# Patient Record
Sex: Female | Born: 1940 | Race: White | Hispanic: No | Marital: Married | Smoking: Never smoker
Health system: Southern US, Community
[De-identification: ages and names within clinical notes are randomized; demographics above are authoritative.]

## PROBLEM LIST (undated history)

## (undated) DIAGNOSIS — N200 Calculus of kidney: Secondary | ICD-10-CM

## (undated) DIAGNOSIS — Z87442 Personal history of urinary calculi: Secondary | ICD-10-CM

## (undated) DIAGNOSIS — M1990 Unspecified osteoarthritis, unspecified site: Secondary | ICD-10-CM

## (undated) DIAGNOSIS — K219 Gastro-esophageal reflux disease without esophagitis: Secondary | ICD-10-CM

## (undated) DIAGNOSIS — I459 Conduction disorder, unspecified: Secondary | ICD-10-CM

## (undated) DIAGNOSIS — K529 Noninfective gastroenteritis and colitis, unspecified: Secondary | ICD-10-CM

## (undated) DIAGNOSIS — I00 Rheumatic fever without heart involvement: Secondary | ICD-10-CM

## (undated) DIAGNOSIS — G4730 Sleep apnea, unspecified: Secondary | ICD-10-CM

## (undated) DIAGNOSIS — M797 Fibromyalgia: Secondary | ICD-10-CM

## (undated) DIAGNOSIS — R51 Headache: Secondary | ICD-10-CM

## (undated) DIAGNOSIS — H3090 Unspecified chorioretinal inflammation, unspecified eye: Secondary | ICD-10-CM

## (undated) DIAGNOSIS — D649 Anemia, unspecified: Secondary | ICD-10-CM

## (undated) DIAGNOSIS — I10 Essential (primary) hypertension: Secondary | ICD-10-CM

## (undated) DIAGNOSIS — F419 Anxiety disorder, unspecified: Secondary | ICD-10-CM

## (undated) HISTORY — PX: COLONOSCOPY: SHX174

## (undated) HISTORY — PX: DILATION AND CURETTAGE OF UTERUS: SHX78

## (undated) HISTORY — PX: TONSILLECTOMY: SUR1361

## (undated) HISTORY — PX: SPINAL CORD STIMULATOR INSERTION: SHX5378

## (undated) HISTORY — PX: CARDIAC CATHETERIZATION: SHX172

---

## 1999-02-14 ENCOUNTER — Ambulatory Visit (HOSPITAL_COMMUNITY): Admission: RE | Admit: 1999-02-14 | Discharge: 1999-02-14 | Payer: Self-pay | Admitting: Obstetrics and Gynecology

## 2001-05-16 ENCOUNTER — Ambulatory Visit (HOSPITAL_COMMUNITY): Admission: RE | Admit: 2001-05-16 | Discharge: 2001-05-16 | Payer: Self-pay | Admitting: Cardiology

## 2004-02-04 ENCOUNTER — Ambulatory Visit (HOSPITAL_COMMUNITY): Admission: RE | Admit: 2004-02-04 | Discharge: 2004-02-04 | Payer: Self-pay | Admitting: Cardiology

## 2004-12-19 ENCOUNTER — Ambulatory Visit: Payer: Self-pay | Admitting: Family Medicine

## 2004-12-27 ENCOUNTER — Ambulatory Visit: Payer: Self-pay | Admitting: Family Medicine

## 2005-10-19 ENCOUNTER — Ambulatory Visit: Payer: Self-pay | Admitting: Family Medicine

## 2006-10-22 ENCOUNTER — Ambulatory Visit: Payer: Self-pay | Admitting: Family Medicine

## 2006-10-30 ENCOUNTER — Ambulatory Visit: Payer: Self-pay | Admitting: Family Medicine

## 2006-11-12 ENCOUNTER — Ambulatory Visit (HOSPITAL_COMMUNITY): Admission: RE | Admit: 2006-11-12 | Discharge: 2006-11-12 | Payer: Self-pay | Admitting: Family Medicine

## 2006-11-22 ENCOUNTER — Encounter: Admission: RE | Admit: 2006-11-22 | Discharge: 2006-11-22 | Payer: Self-pay | Admitting: Family Medicine

## 2006-11-22 ENCOUNTER — Ambulatory Visit: Payer: Self-pay | Admitting: Family Medicine

## 2008-10-12 ENCOUNTER — Encounter: Admission: RE | Admit: 2008-10-12 | Discharge: 2008-12-29 | Payer: Self-pay | Admitting: Specialist

## 2011-01-21 ENCOUNTER — Encounter: Payer: Self-pay | Admitting: Family Medicine

## 2011-05-18 NOTE — Cardiovascular Report (Signed)
NAME:  Emily Mcbride, Emily Mcbride                       ACCOUNT NO.:  [id number]   MEDICAL RECORD NO.:  [id number]                   PATIENT TYPE:  OIB   LOCATION:  2859                                 FACILITY:  [facility]   PHYSICIAN:  Colleen Can. Deborah Chalk, M.D.            DATE OF BIRTH:  1941-08-27   DATE OF PROCEDURE:  02/04/2004  DATE OF DISCHARGE:                              CARDIAC CATHETERIZATION   PROCEDURES PERFORMED:  1. Left heart catheterization.  2. Selective coronary angiography.  3. Left ventricular angiography.  4. Angio-Seal.   CARDIOLOGIST:  Colleen Can. Deborah Chalk, M.D.   HISTORY:  [name] was referred for evaluation of a deep heavy chest  discomfort.  She has had previous equivocal adenosine Cardiolite in large  part due to her obesity.  Since 2002 she has gained 25 pounds of weight.  She was referred for catheterization for evaluation of chest pain, possible  coronary atherosclerosis.  She had essentially a negative catheterization in  2002.   TYPE AND SITE OF ENTRY:  Percutaneous right femoral artery.   CATHETERS:  Six French 4 curved Judkins right and left coronary catheters.  Six French pigtail ventriculographic catheter.   CONTRAST MATERIAL:  Omnipaque.   MEDICATIONS:  Medication given prior to the procedure; Valium 10 mg p.o.   Medication given during the procedure; Versed 2 mg IV.   COMMENTS:  The patient tolerated the procedure well.   HEMODYNAMIC DATA:  Aortic pressure is 161/94.  LV was 152/9-17.  There is no  aortic valve gradient noted on pullback.   ANGIOGRAPHIC DATA:  1. Left Main Coronary Artery:  The left main coronary artery is normal.   1. Left Anterior Descending:  The left anterior descending was a moderate-     sized vessel that extended to the apex.  It was normal.   1. Left Circumflex:  The left circumflex had a short trunk off the left main     coronary artery, then divided into a high obtuse marginal that would     basically take an  intermediate distribution.  There was a smaller     continuation branch of the left circumflex in the AV groove.  The left     circumflex was normal.   1. Right Coronary Artery:  The right coronary artery is a moderate-sized     dominant vessel.  It is normal.   1. Left Ventricular Angiogram:  Left ventricular angiogram was performed in     the RAO position.  Overall cardiac size and silhouette are normal.  The     global ejection fraction was 60%.  Regional wall motion was normal.   OVERALL IMPRESSION:  1. Normal left ventricular function.  2. Normal coronary arteries.   DISCUSSION:  [name] is hypertension at this point in time and clearly her  obesity and hypertension are almost certainly the cause of her symptoms.  We  will try to manage her blood pressure  as well as encourage her to modify her  weight.                                               Colleen Can. Deborah Chalk, M.D.    SNT/MEDQ  D:  02/04/2004  T:  02/05/2004  Job:  [id number]   cc:   Delaney Meigs, M.D.  [address]  [place]  [place] [address]  Fax: [phone]   Dr. Yetta Barre

## 2011-05-18 NOTE — H&P (Signed)
NAME:  Emily Mcbride, Emily Mcbride                       ACCOUNT NO.:  [id number]   MEDICAL RECORD NO.:  [id number]                   PATIENT TYPE:  OIB   LOCATION:  2859                                 FACILITY:  [facility]   PHYSICIAN:  Colleen Can. Deborah Chalk, M.D.            DATE OF BIRTH:  07/24/1941   DATE OF ADMISSION:  02/04/2004  DATE OF DISCHARGE:                                HISTORY & PHYSICAL   CHIEF COMPLAINT:  Chest pain.   HISTORY OF PRESENT ILLNESS:  Ms. Pado is a 70 year old white female who  has multiple medical problems.  She presented to the office for an  appointment earlier this week with complaints of chest heaviness that was  radiating to her back.  She had been in the [employer] approximately four days  prior when she had an episode of deep, heavy substernal chest discomfort.  She was taken to see Dr. Fayrene Fearing, who was covering for Dr. Lysbeth Galas, and at that  point in time it was noted that her blood pressure was elevated, at 185/100.  She had really no associated symptoms, such as pallor, diaphoresis,  dizziness or light-headedness.  Her hands were somewhat sweaty.  She had  been off of her Toprol for one day, and off of aspirin for three or four  days, but had remained on her proton pump inhibitor during this time frame.  She was subsequently seen in the office, and is now referred for repeat  cardiac catheterization.  It is felt that we cannot satisfactorily evaluate  the patient's coronary anatomy with noninvasive studies, due to her problem  with obesity.   PAST MEDICAL HISTORY:  1. Previous equivocal adenosine Cardiolite in 2002 with subsequent cardiac     catheterization, May of 2002, showing no significant coronary disease.  2. Dilation of the right heart per 2-D echocardiogram in 2002.  3. Obesity.  4. Kidney stones in 1988.  5. History of D&C x 2.  6. Tonsillectomy.  7. History of toxoplasmosis.  8. Elevated cholesterol levels.  9. Positive family history for heart  disease.  10.      A history of rheumatic fever, but with negative follow-up 2-D     echocardiogram.  11.      Hypertension.  12.      PVCs.  13.      Cardiomegaly by chest x-ray.   ALLERGIES:  SULFA.   CURRENT MEDICATIONS:  Mobic 15 mg a day, Prilosec 20 mg a day, aspirin  daily, Toprol XL 50 mg a day, and Zoloft 50 mg a day.   FAMILY HISTORY:  Father died of questionable causes.  Mother died at 93 with  Cushing's disease, and had diabetes.   SOCIAL HISTORY:  She is married.  She has two children who are alive and  well.  There is no history of tobacco or alcohol.   REVIEW OF SYSTEMS:  Basically as noted above, and is otherwise unremarkable.  EXAM:  VITAL SIGNS:  Weight is 255, blood pressure 150/90 sitting, 130/90 standing,  heart rate 92 and regular, respirations 18.  She is afebrile.   HEENT:  Unremarkable.   NECK:  Supple.   LUNGS:  Clear.   HEART:  Regular rhythm.   ABDOMEN:  Obese.  It is soft.  Positive bowel sounds.  Nontender .   EXTREMITIES:  Without edema.   NEUROLOGIC:  Intact.  There are no gross focal deficits.   PERTINENT LABS:  Pending.   EKG:  Nonspecific ST and T wave changes.   OVERALL IMPRESSION:  1. Previous episode of chest pain, with history of an abnormal adenosine     Cardiolite in 2002.  2. Previous negative cardiac catheterization in 2002.  3. Obesity.  4. Hypertension, currently uncontrolled.  5. Hyperlipidemia off statin therapy.   PLAN:  We will proceed on with cardiac catheterization.  The procedure has  been reviewed in full detail, and she is willing to proceed on Friday,  February 04, 2004.      Juanell Fairly C. Earl Gala, N.P.                 Colleen Can. Deborah Chalk, M.D.    LCO/MEDQ  D:  02/04/2004  T:  02/04/2004  Job:  [id number]   cc:   Delaney Meigs, M.D.  [address]  [place]  [place] [address]  Fax: [phone]

## 2013-04-10 ENCOUNTER — Encounter (INDEPENDENT_AMBULATORY_CARE_PROVIDER_SITE_OTHER): Payer: Medicare PPO | Admitting: Ophthalmology

## 2013-04-10 DIAGNOSIS — H31009 Unspecified chorioretinal scars, unspecified eye: Secondary | ICD-10-CM

## 2013-04-10 DIAGNOSIS — H35039 Hypertensive retinopathy, unspecified eye: Secondary | ICD-10-CM

## 2013-04-10 DIAGNOSIS — I10 Essential (primary) hypertension: Secondary | ICD-10-CM

## 2013-04-10 DIAGNOSIS — H43819 Vitreous degeneration, unspecified eye: Secondary | ICD-10-CM

## 2013-04-10 DIAGNOSIS — H2510 Age-related nuclear cataract, unspecified eye: Secondary | ICD-10-CM

## 2013-06-01 ENCOUNTER — Other Ambulatory Visit: Payer: Self-pay | Admitting: Sports Medicine

## 2013-06-01 DIAGNOSIS — M545 Low back pain, unspecified: Secondary | ICD-10-CM

## 2013-06-06 ENCOUNTER — Ambulatory Visit
Admission: RE | Admit: 2013-06-06 | Discharge: 2013-06-06 | Disposition: A | Discharge status: Home / Self Care | Payer: Medicare PPO | Source: Ambulatory Visit | Attending: Sports Medicine | Admitting: Sports Medicine

## 2013-06-06 DIAGNOSIS — M545 Low back pain, unspecified: Secondary | ICD-10-CM

## 2013-11-12 ENCOUNTER — Other Ambulatory Visit: Payer: Self-pay | Admitting: Neurosurgery

## 2013-11-13 ENCOUNTER — Encounter (HOSPITAL_COMMUNITY): Payer: Self-pay | Admitting: *Deleted

## 2013-11-13 ENCOUNTER — Other Ambulatory Visit (HOSPITAL_COMMUNITY): Payer: Self-pay | Admitting: *Deleted

## 2013-11-13 ENCOUNTER — Encounter (HOSPITAL_COMMUNITY): Payer: Self-pay | Admitting: Pharmacy Technician

## 2013-11-16 ENCOUNTER — Encounter (HOSPITAL_COMMUNITY): Payer: Self-pay

## 2013-11-16 ENCOUNTER — Ambulatory Visit (HOSPITAL_COMMUNITY)
Admission: RE | Admit: 2013-11-16 | Discharge: 2013-11-17 | Disposition: A | Discharge status: Home / Self Care | Payer: Medicare PPO | Source: Ambulatory Visit | Attending: Neurosurgery | Admitting: Neurosurgery

## 2013-11-16 ENCOUNTER — Inpatient Hospital Stay (HOSPITAL_COMMUNITY): Payer: Medicare PPO

## 2013-11-16 ENCOUNTER — Encounter (HOSPITAL_COMMUNITY): Payer: Medicare PPO | Admitting: Anesthesiology

## 2013-11-16 ENCOUNTER — Inpatient Hospital Stay (HOSPITAL_COMMUNITY): Payer: Medicare PPO | Admitting: Anesthesiology

## 2013-11-16 ENCOUNTER — Encounter (HOSPITAL_COMMUNITY)
Admission: RE | Disposition: A | Discharge status: Home / Self Care | Payer: Self-pay | Source: Ambulatory Visit | Attending: Neurosurgery

## 2013-11-16 DIAGNOSIS — M1990 Unspecified osteoarthritis, unspecified site: Secondary | ICD-10-CM | POA: Insufficient documentation

## 2013-11-16 DIAGNOSIS — I10 Essential (primary) hypertension: Secondary | ICD-10-CM | POA: Insufficient documentation

## 2013-11-16 DIAGNOSIS — M48062 Spinal stenosis, lumbar region with neurogenic claudication: Principal | ICD-10-CM | POA: Diagnosis present

## 2013-11-16 DIAGNOSIS — N200 Calculus of kidney: Secondary | ICD-10-CM | POA: Insufficient documentation

## 2013-11-16 DIAGNOSIS — K219 Gastro-esophageal reflux disease without esophagitis: Secondary | ICD-10-CM | POA: Insufficient documentation

## 2013-11-16 DIAGNOSIS — IMO0001 Reserved for inherently not codable concepts without codable children: Secondary | ICD-10-CM | POA: Insufficient documentation

## 2013-11-16 DIAGNOSIS — G4730 Sleep apnea, unspecified: Secondary | ICD-10-CM | POA: Insufficient documentation

## 2013-11-16 DIAGNOSIS — R51 Headache: Secondary | ICD-10-CM | POA: Insufficient documentation

## 2013-11-16 HISTORY — DX: Rheumatic fever without heart involvement: I00

## 2013-11-16 HISTORY — DX: Noninfective gastroenteritis and colitis, unspecified: K52.9

## 2013-11-16 HISTORY — DX: Essential (primary) hypertension: I10

## 2013-11-16 HISTORY — DX: Headache: R51

## 2013-11-16 HISTORY — DX: Sleep apnea, unspecified: G47.30

## 2013-11-16 HISTORY — DX: Gastro-esophageal reflux disease without esophagitis: K21.9

## 2013-11-16 HISTORY — DX: Calculus of kidney: N20.0

## 2013-11-16 HISTORY — DX: Unspecified osteoarthritis, unspecified site: M19.90

## 2013-11-16 HISTORY — DX: Conduction disorder, unspecified: I45.9

## 2013-11-16 HISTORY — DX: Fibromyalgia: M79.7

## 2013-11-16 HISTORY — DX: Unspecified chorioretinal inflammation, unspecified eye: H30.90

## 2013-11-16 HISTORY — DX: Anemia, unspecified: D64.9

## 2013-11-16 HISTORY — PX: LUMBAR LAMINECTOMY/DECOMPRESSION MICRODISCECTOMY: SHX5026

## 2013-11-16 LAB — CBC WITH DIFFERENTIAL/PLATELET
Basophils Relative: 1 % (ref 0–1)
Eosinophils Absolute: 0.2 10*3/uL (ref 0.0–0.7)
Eosinophils Relative: 5 % (ref 0–5)
HCT: 36 % (ref 36.0–46.0)
Lymphs Abs: 1.6 10*3/uL (ref 0.7–4.0)
MCH: 31.6 pg (ref 26.0–34.0)
MCV: 93.3 fL (ref 78.0–100.0)
Monocytes Absolute: 0.5 10*3/uL (ref 0.1–1.0)
Platelets: 158 10*3/uL (ref 150–400)
RBC: 3.86 MIL/uL — ABNORMAL LOW (ref 3.87–5.11)

## 2013-11-16 LAB — BASIC METABOLIC PANEL
CO2: 27 mEq/L (ref 19–32)
Calcium: 9.7 mg/dL (ref 8.4–10.5)
Chloride: 106 mEq/L (ref 96–112)
Creatinine, Ser: 0.84 mg/dL (ref 0.50–1.10)
Glucose, Bld: 107 mg/dL — ABNORMAL HIGH (ref 70–99)
Potassium: 4.9 mEq/L (ref 3.5–5.1)
Sodium: 141 mEq/L (ref 135–145)

## 2013-11-16 LAB — SURGICAL PCR SCREEN: MRSA, PCR: NEGATIVE

## 2013-11-16 SURGERY — LUMBAR LAMINECTOMY/DECOMPRESSION MICRODISCECTOMY 1 LEVEL
Anesthesia: General | Site: Back | Laterality: Right | Wound class: Clean

## 2013-11-16 MED ORDER — THROMBIN 5000 UNITS EX SOLR
CUTANEOUS | Status: DC | PRN
  Administered 2013-11-16 (×2): 5000 [IU] via TOPICAL

## 2013-11-16 MED ORDER — KETOROLAC TROMETHAMINE 30 MG/ML IJ SOLN
INTRAMUSCULAR | Status: DC | PRN
  Administered 2013-11-16: 30 mg via INTRAVENOUS

## 2013-11-16 MED ORDER — ARTIFICIAL TEARS OP OINT
TOPICAL_OINTMENT | OPHTHALMIC | Status: DC | PRN
  Administered 2013-11-16: 1 via OPHTHALMIC

## 2013-11-16 MED ORDER — MUPIROCIN 2 % EX OINT
TOPICAL_OINTMENT | Freq: Two times a day (BID) | CUTANEOUS | Status: DC
  Administered 2013-11-16: 12:00:00 via NASAL
  Filled 2013-11-16: qty 22

## 2013-11-16 MED ORDER — SODIUM CHLORIDE 0.9 % IJ SOLN: 3.0000 mL | INTRAMUSCULAR | Status: DC | PRN

## 2013-11-16 MED ORDER — ACETAMINOPHEN 650 MG RE SUPP: 650.0000 mg | RECTAL | Status: DC | PRN

## 2013-11-16 MED ORDER — HEMOSTATIC AGENTS (NO CHARGE) OPTIME
TOPICAL | Status: DC | PRN
  Administered 2013-11-16: 1 via TOPICAL

## 2013-11-16 MED ORDER — CYCLOBENZAPRINE HCL 10 MG PO TABS
10.0000 mg | ORAL_TABLET | Freq: Three times a day (TID) | ORAL | Status: DC | PRN
  Administered 2013-11-16: 10 mg via ORAL

## 2013-11-16 MED ORDER — HYDROMORPHONE HCL PF 1 MG/ML IJ SOLN
INTRAMUSCULAR | Status: AC
  Filled 2013-11-16: qty 1

## 2013-11-16 MED ORDER — CEFAZOLIN SODIUM-DEXTROSE 2-3 GM-% IV SOLR
INTRAVENOUS | Status: DC | PRN
  Administered 2013-11-16: 2 g via INTRAVENOUS

## 2013-11-16 MED ORDER — HYDROCODONE-ACETAMINOPHEN 5-325 MG PO TABS: 1.0000 | ORAL_TABLET | ORAL | Status: DC | PRN

## 2013-11-16 MED ORDER — BUPIVACAINE HCL (PF) 0.25 % IJ SOLN
INTRAMUSCULAR | Status: DC | PRN
  Administered 2013-11-16: 20 mL

## 2013-11-16 MED ORDER — MUPIROCIN 2 % EX OINT
TOPICAL_OINTMENT | Freq: Two times a day (BID) | CUTANEOUS | Status: DC
  Administered 2013-11-16: 22:00:00 via NASAL

## 2013-11-16 MED ORDER — MESALAMINE ER 0.375 G PO CP24: 1.5000 g | ORAL_CAPSULE | Freq: Every day | ORAL | Status: DC

## 2013-11-16 MED ORDER — OXYCODONE-ACETAMINOPHEN 5-325 MG PO TABS: 1.0000 | ORAL_TABLET | ORAL | Status: DC | PRN

## 2013-11-16 MED ORDER — OXYCODONE HCL 5 MG/5ML PO SOLN: 5.0000 mg | Freq: Once | ORAL | Status: AC | PRN

## 2013-11-16 MED ORDER — SODIUM CHLORIDE 0.9 % IR SOLN
Status: DC | PRN
  Administered 2013-11-16: 16:00:00

## 2013-11-16 MED ORDER — CYCLOBENZAPRINE HCL 10 MG PO TABS
ORAL_TABLET | ORAL | Status: AC
  Filled 2013-11-16: qty 1

## 2013-11-16 MED ORDER — HYDROMORPHONE HCL PF 1 MG/ML IJ SOLN
0.2500 mg | INTRAMUSCULAR | Status: DC | PRN
  Administered 2013-11-16 (×4): 0.5 mg via INTRAVENOUS

## 2013-11-16 MED ORDER — MEPERIDINE HCL 25 MG/ML IJ SOLN: 6.2500 mg | INTRAMUSCULAR | Status: DC | PRN

## 2013-11-16 MED ORDER — MIDAZOLAM HCL 5 MG/5ML IJ SOLN
INTRAMUSCULAR | Status: DC | PRN
  Administered 2013-11-16: 2 mg via INTRAVENOUS

## 2013-11-16 MED ORDER — MIDAZOLAM HCL 2 MG/2ML IJ SOLN: 0.5000 mg | Freq: Once | INTRAMUSCULAR | Status: DC | PRN

## 2013-11-16 MED ORDER — KETOROLAC TROMETHAMINE 30 MG/ML IJ SOLN
30.0000 mg | Freq: Four times a day (QID) | INTRAMUSCULAR | Status: DC
  Administered 2013-11-16 – 2013-11-17 (×2): 30 mg via INTRAVENOUS
  Filled 2013-11-16 (×6): qty 1

## 2013-11-16 MED ORDER — EPHEDRINE SULFATE 50 MG/ML IJ SOLN
INTRAMUSCULAR | Status: DC | PRN
  Administered 2013-11-16 (×2): 20 mg via INTRAVENOUS
  Administered 2013-11-16 (×2): 10 mg via INTRAVENOUS
  Administered 2013-11-16: 20 mg via INTRAVENOUS
  Administered 2013-11-16: 10 mg via INTRAVENOUS
  Administered 2013-11-16: 20 mg via INTRAVENOUS

## 2013-11-16 MED ORDER — DEXAMETHASONE SODIUM PHOSPHATE 10 MG/ML IJ SOLN
10.0000 mg | INTRAMUSCULAR | Status: AC
  Administered 2013-11-16: 10 mg via INTRAVENOUS
  Filled 2013-11-16: qty 1

## 2013-11-16 MED ORDER — ONDANSETRON HCL 4 MG/2ML IJ SOLN
INTRAMUSCULAR | Status: DC | PRN
  Administered 2013-11-16: 4 mg via INTRAVENOUS

## 2013-11-16 MED ORDER — ACETAMINOPHEN 325 MG PO TABS: 650.0000 mg | ORAL_TABLET | ORAL | Status: DC | PRN

## 2013-11-16 MED ORDER — LACTATED RINGERS IV SOLN
INTRAVENOUS | Status: DC
  Administered 2013-11-16 (×2): via INTRAVENOUS

## 2013-11-16 MED ORDER — PROMETHAZINE HCL 25 MG/ML IJ SOLN: 6.2500 mg | INTRAMUSCULAR | Status: DC | PRN

## 2013-11-16 MED ORDER — FENTANYL CITRATE 0.05 MG/ML IJ SOLN
INTRAMUSCULAR | Status: DC | PRN
  Administered 2013-11-16: 250 ug via INTRAVENOUS

## 2013-11-16 MED ORDER — SENNA 8.6 MG PO TABS
1.0000 | ORAL_TABLET | Freq: Two times a day (BID) | ORAL | Status: DC
  Administered 2013-11-16: 8.6 mg via ORAL
  Filled 2013-11-16 (×3): qty 1

## 2013-11-16 MED ORDER — OXYCODONE HCL 5 MG PO TABS
5.0000 mg | ORAL_TABLET | Freq: Once | ORAL | Status: AC | PRN
  Administered 2013-11-16: 5 mg via ORAL

## 2013-11-16 MED ORDER — ASPIRIN EC 81 MG PO TBEC
81.0000 mg | DELAYED_RELEASE_TABLET | Freq: Every day | ORAL | Status: DC
  Administered 2013-11-16: 81 mg via ORAL
  Filled 2013-11-16 (×2): qty 1

## 2013-11-16 MED ORDER — NEOSTIGMINE METHYLSULFATE 1 MG/ML IJ SOLN
INTRAMUSCULAR | Status: DC | PRN
  Administered 2013-11-16: 4 mg via INTRAVENOUS

## 2013-11-16 MED ORDER — SODIUM CHLORIDE 0.9 % IJ SOLN
3.0000 mL | Freq: Two times a day (BID) | INTRAMUSCULAR | Status: DC
  Administered 2013-11-16: 3 mL via INTRAVENOUS

## 2013-11-16 MED ORDER — PREGABALIN 50 MG PO CAPS
150.0000 mg | ORAL_CAPSULE | Freq: Two times a day (BID) | ORAL | Status: DC
  Administered 2013-11-16: 150 mg via ORAL
  Filled 2013-11-16 (×2): qty 1

## 2013-11-16 MED ORDER — GLYCOPYRROLATE 0.2 MG/ML IJ SOLN
INTRAMUSCULAR | Status: DC | PRN
  Administered 2013-11-16: 0.6 mg via INTRAVENOUS

## 2013-11-16 MED ORDER — ALUM & MAG HYDROXIDE-SIMETH 200-200-20 MG/5ML PO SUSP: 30.0000 mL | Freq: Four times a day (QID) | ORAL | Status: DC | PRN

## 2013-11-16 MED ORDER — NEBIVOLOL HCL 10 MG PO TABS
10.0000 mg | ORAL_TABLET | Freq: Every day | ORAL | Status: DC
  Filled 2013-11-16: qty 1

## 2013-11-16 MED ORDER — MENTHOL 3 MG MT LOZG: 1.0000 | LOZENGE | OROMUCOSAL | Status: DC | PRN

## 2013-11-16 MED ORDER — PROPOFOL 10 MG/ML IV BOLUS
INTRAVENOUS | Status: DC | PRN
  Administered 2013-11-16: 120 mg via INTRAVENOUS

## 2013-11-16 MED ORDER — LIDOCAINE HCL (CARDIAC) 20 MG/ML IV SOLN
INTRAVENOUS | Status: DC | PRN
  Administered 2013-11-16: 20 mg via INTRAVENOUS

## 2013-11-16 MED ORDER — LABETALOL HCL 5 MG/ML IV SOLN
INTRAVENOUS | Status: DC | PRN
  Administered 2013-11-16 (×2): 5 mg via INTRAVENOUS

## 2013-11-16 MED ORDER — CEFAZOLIN SODIUM-DEXTROSE 2-3 GM-% IV SOLR
2.0000 g | INTRAVENOUS | Status: AC
  Administered 2013-11-16: 2 g via INTRAVENOUS
  Filled 2013-11-16: qty 50

## 2013-11-16 MED ORDER — MESALAMINE ER 0.375 G PO CP24
1.5000 g | ORAL_CAPSULE | Freq: Every day | ORAL | Status: DC
  Administered 2013-11-16: 1.5 g via ORAL

## 2013-11-16 MED ORDER — HYDROMORPHONE HCL PF 1 MG/ML IJ SOLN
0.5000 mg | INTRAMUSCULAR | Status: DC | PRN
  Administered 2013-11-17: 1 mg via INTRAVENOUS
  Filled 2013-11-16: qty 1

## 2013-11-16 MED ORDER — ROCURONIUM BROMIDE 100 MG/10ML IV SOLN
INTRAVENOUS | Status: DC | PRN
  Administered 2013-11-16: 50 mg via INTRAVENOUS

## 2013-11-16 MED ORDER — CEFAZOLIN SODIUM 1-5 GM-% IV SOLN
1.0000 g | Freq: Three times a day (TID) | INTRAVENOUS | Status: AC
  Administered 2013-11-16 – 2013-11-17 (×2): 1 g via INTRAVENOUS
  Filled 2013-11-16 (×2): qty 50

## 2013-11-16 MED ORDER — SODIUM CHLORIDE 0.9 % IV SOLN: 250.0000 mL | INTRAVENOUS | Status: DC

## 2013-11-16 MED ORDER — ONDANSETRON HCL 4 MG/2ML IJ SOLN: 4.0000 mg | INTRAMUSCULAR | Status: DC | PRN

## 2013-11-16 MED ORDER — OXYCODONE HCL 5 MG PO TABS
ORAL_TABLET | ORAL | Status: AC
  Filled 2013-11-16: qty 1

## 2013-11-16 MED ORDER — SERTRALINE HCL 50 MG PO TABS
50.0000 mg | ORAL_TABLET | Freq: Every day | ORAL | Status: DC
  Filled 2013-11-16: qty 1

## 2013-11-16 MED ORDER — PHENOL 1.4 % MT LIQD: 1.0000 | OROMUCOSAL | Status: DC | PRN

## 2013-11-16 SURGICAL SUPPLY — 52 items
BAG DECANTER FOR FLEXI CONT (MISCELLANEOUS) ×2 IMPLANT
BENZOIN TINCTURE PRP APPL 2/3 (GAUZE/BANDAGES/DRESSINGS) ×2 IMPLANT
BLADE SURG ROTATE 9660 (MISCELLANEOUS) IMPLANT
BRUSH SCRUB EZ PLAIN DRY (MISCELLANEOUS) ×2 IMPLANT
BUR CUTTER 7.0 ROUND (BURR) ×2 IMPLANT
CANISTER SUCT 3000ML (MISCELLANEOUS) ×2 IMPLANT
CONT SPEC 4OZ CLIKSEAL STRL BL (MISCELLANEOUS) ×2 IMPLANT
DECANTER SPIKE VIAL GLASS SM (MISCELLANEOUS) ×2 IMPLANT
DERMABOND ADVANCED (GAUZE/BANDAGES/DRESSINGS) ×1
DERMABOND ADVANCED .7 DNX12 (GAUZE/BANDAGES/DRESSINGS) ×1 IMPLANT
DRAPE LAPAROTOMY 100X72X124 (DRAPES) ×2 IMPLANT
DRAPE MICROSCOPE LEICA (MISCELLANEOUS) ×2 IMPLANT
DRAPE MICROSCOPE ZEISS OPMI (DRAPES) IMPLANT
DRAPE POUCH INSTRU U-SHP 10X18 (DRAPES) ×2 IMPLANT
DRAPE PROXIMA HALF (DRAPES) IMPLANT
DRAPE SURG 17X23 STRL (DRAPES) ×4 IMPLANT
DURAPREP 26ML APPLICATOR (WOUND CARE) ×2 IMPLANT
ELECT REM PT RETURN 9FT ADLT (ELECTROSURGICAL) ×2
ELECTRODE REM PT RTRN 9FT ADLT (ELECTROSURGICAL) ×1 IMPLANT
EVACUATOR 1/8 PVC DRAIN (DRAIN) ×2 IMPLANT
GAUZE SPONGE 4X4 16PLY XRAY LF (GAUZE/BANDAGES/DRESSINGS) IMPLANT
GLOVE BIOGEL PI IND STRL 7.5 (GLOVE) ×1 IMPLANT
GLOVE BIOGEL PI IND STRL 8.5 (GLOVE) ×1 IMPLANT
GLOVE BIOGEL PI INDICATOR 7.5 (GLOVE) ×1
GLOVE BIOGEL PI INDICATOR 8.5 (GLOVE) ×1
GLOVE ECLIPSE 8.5 STRL (GLOVE) ×4 IMPLANT
GLOVE EXAM NITRILE LRG STRL (GLOVE) IMPLANT
GLOVE EXAM NITRILE MD LF STRL (GLOVE) ×2 IMPLANT
GLOVE EXAM NITRILE XL STR (GLOVE) IMPLANT
GLOVE EXAM NITRILE XS STR PU (GLOVE) IMPLANT
GLOVE SURG SS PI 7.0 STRL IVOR (GLOVE) ×4 IMPLANT
GOWN BRE IMP SLV AUR LG STRL (GOWN DISPOSABLE) IMPLANT
GOWN BRE IMP SLV AUR XL STRL (GOWN DISPOSABLE) ×4 IMPLANT
GOWN STRL REIN 2XL LVL4 (GOWN DISPOSABLE) ×2 IMPLANT
KIT BASIN OR (CUSTOM PROCEDURE TRAY) ×2 IMPLANT
KIT ROOM TURNOVER OR (KITS) ×2 IMPLANT
NEEDLE HYPO 22GX1.5 SAFETY (NEEDLE) ×2 IMPLANT
NEEDLE SPNL 22GX3.5 QUINCKE BK (NEEDLE) ×2 IMPLANT
NS IRRIG 1000ML POUR BTL (IV SOLUTION) ×2 IMPLANT
PACK LAMINECTOMY NEURO (CUSTOM PROCEDURE TRAY) ×2 IMPLANT
PAD ARMBOARD 7.5X6 YLW CONV (MISCELLANEOUS) ×6 IMPLANT
RUBBERBAND STERILE (MISCELLANEOUS) ×4 IMPLANT
SPONGE GAUZE 4X4 12PLY (GAUZE/BANDAGES/DRESSINGS) ×2 IMPLANT
SPONGE SURGIFOAM ABS GEL SZ50 (HEMOSTASIS) ×2 IMPLANT
STRIP CLOSURE SKIN 1/2X4 (GAUZE/BANDAGES/DRESSINGS) ×2 IMPLANT
SUT VIC AB 2-0 CT1 18 (SUTURE) ×2 IMPLANT
SUT VIC AB 3-0 SH 8-18 (SUTURE) ×2 IMPLANT
SYR 20ML ECCENTRIC (SYRINGE) ×2 IMPLANT
TAPE CLOTH SURG 4X10 WHT LF (GAUZE/BANDAGES/DRESSINGS) ×2 IMPLANT
TOWEL OR 17X24 6PK STRL BLUE (TOWEL DISPOSABLE) ×2 IMPLANT
TOWEL OR 17X26 10 PK STRL BLUE (TOWEL DISPOSABLE) ×2 IMPLANT
WATER STERILE IRR 1000ML POUR (IV SOLUTION) ×2 IMPLANT

## 2013-11-16 NOTE — Preoperative (Signed)
Beta Blockers   Reason not to administer Beta Blockers:BB taken on 11/16/13 @ 0800.

## 2013-11-16 NOTE — Transfer of Care (Signed)
Immediate Anesthesia Transfer of Care Note  Patient: Emily Mcbride  Procedure(s) Performed: Procedure(s) with comments: LUMBAR LAMINECTOMY/Laminotomy/DECOMPRESSION MICRODISCECTOMY RIGHT LUMBAR FOUR-FIVE (Right) - LUMBAR LAMINECTOMY/Laminotomy/DECOMPRESSION MICRODISCECTOMY RIGHT LUMBAR FOUR-FIVE  Patient Location: PACU  Anesthesia Type:General  Level of Consciousness: awake, alert  and oriented  Airway & Oxygen Therapy: Patient Spontanous Breathing and Patient connected to nasal cannula oxygen  Post-op Assessment: Report given to PACU RN, Post -op Vital signs reviewed and stable and Patient moving all extremities  Post vital signs: Reviewed and stable  Complications: No apparent anesthesia complications

## 2013-11-16 NOTE — Anesthesia Postprocedure Evaluation (Signed)
Anesthesia Post Note  Patient: Emily Mcbride  Procedure(s) Performed: Procedure(s) (LRB): LUMBAR LAMINECTOMY/Laminotomy/DECOMPRESSION MICRODISCECTOMY RIGHT LUMBAR FOUR-FIVE (Right)  Anesthesia type: General  Patient location: PACU  Post pain: Pain level controlled and Adequate analgesia  Post assessment: Post-op Vital signs reviewed, Patient's Cardiovascular Status Stable, Respiratory Function Stable, Patent Airway and Pain level controlled  Last Vitals:  Filed Vitals:   11/16/13 1745  BP:   Pulse: 67  Temp: 36.6 C  Resp: 21    Post vital signs: Reviewed and stable  Level of consciousness: awake, alert  and oriented  Complications: No apparent anesthesia complications

## 2013-11-16 NOTE — Anesthesia Procedure Notes (Signed)
Procedure Name: Intubation Date/Time: 11/16/2013 2:54 PM Performed by: Orvilla Fus A Pre-anesthesia Checklist: Patient identified, Timeout performed, Emergency Drugs available, Suction available and Patient being monitored Patient Re-evaluated:Patient Re-evaluated prior to inductionOxygen Delivery Method: Circle system utilized Preoxygenation: Pre-oxygenation with 100% oxygen Intubation Type: IV induction Ventilation: Mask ventilation without difficulty and Oral airway inserted - appropriate to patient size Laryngoscope Size: Mac and 3 Grade View: Grade II Tube type: Oral Tube size: 7.0 mm Number of attempts: 1 Airway Equipment and Method: Stylet Placement Confirmation: ETT inserted through vocal cords under direct vision,  breath sounds checked- equal and bilateral and positive ETCO2 Secured at: 21 cm Tube secured with: Tape Dental Injury: Teeth and Oropharynx as per pre-operative assessment

## 2013-11-16 NOTE — H&P (Signed)
Emily Mcbride is an 72 y.o. female.   Chief Complaint: Right leg pain HPI: A 72 year old female with severe right lower extremity pain paresthesias and some weakness consistent predominantly with a right-sided L5 radiculopathy. Symptoms are worsened by standing or walking. Workup demonstrates evidence of marked spondylosis and stenosis at L4-5. Patient presents now for right L4-5 decompressive laminotomy and foraminotomy in improving her symptoms.  Past Medical History  Diagnosis Date  . Hypertension   . Skipped heart beats     due to rheumatic fever as a child  . Fibromyalgia   . Sleep apnea     does not use CPAP due discomfort  . Kidney stones   . GERD (gastroesophageal reflux disease)     no longer takes meds   . Headache(784.0)     due to a fall several mos.  . Arthritis     osteoarthritis  . Rheumatic fever     as a child  . Anemia     as a child  . Colitis   . Chorioretinitis     Past Surgical History  Procedure Laterality Date  . Tonsillectomy    . Cardiac catheterization    . Colonoscopy      History reviewed. No pertinent family history. Social History:  reports that she has never smoked. She has never used smokeless tobacco. She reports that she does not drink alcohol or use illicit drugs.  Allergies:  Allergies  Allergen Reactions  . Sulfa Antibiotics Hives, Rash and Other (See Comments)    Blisters     Medications Prior to Admission  Medication Sig Dispense Refill  . meloxicam (MOBIC) 15 MG tablet Take 15 mg by mouth daily.      . mesalamine (APRISO) 0.375 G 24 hr capsule Take 1.5 g by mouth daily.       . nebivolol (BYSTOLIC) 10 MG tablet Take 10 mg by mouth daily.      . pregabalin (LYRICA) 75 MG capsule Take 150 mg by mouth 2 (two) times daily.      . sertraline (ZOLOFT) 50 MG tablet Take 50 mg by mouth daily.      Marland Kitchen triamcinolone cream (KENALOG) 0.1 % Apply 1 application topically 2 (two) times daily as needed (rash).      Marland Kitchen aspirin EC 81 MG  tablet Take 81 mg by mouth daily.        Results for orders placed during the hospital encounter of 11/16/13 (from the past 48 hour(s))  BASIC METABOLIC PANEL     Status: Abnormal   Collection Time    11/16/13 11:03 AM      Result Value Range   Sodium 141  135 - 145 mEq/L   Potassium 4.9  3.5 - 5.1 mEq/L   Chloride 106  96 - 112 mEq/L   CO2 27  19 - 32 mEq/L   Glucose, Bld 107 (*) 70 - 99 mg/dL   BUN 22  6 - 23 mg/dL   Creatinine, Ser 2.95  0.50 - 1.10 mg/dL   Calcium 9.7  8.4 - 62.1 mg/dL   GFR calc non Af Amer 68 (*) >90 mL/min   GFR calc Af Amer 79 (*) >90 mL/min   Comment: (NOTE)     The eGFR has been calculated using the CKD EPI equation.     This calculation has not been validated in all clinical situations.     eGFR's persistently <90 mL/min signify possible Chronic Kidney     Disease.  CBC WITH DIFFERENTIAL     Status: Abnormal   Collection Time    11/16/13 11:03 AM      Result Value Range   WBC 5.0  4.0 - 10.5 K/uL   RBC 3.86 (*) 3.87 - 5.11 MIL/uL   Hemoglobin 12.2  12.0 - 15.0 g/dL   HCT 40.9  81.1 - 91.4 %   MCV 93.3  78.0 - 100.0 fL   MCH 31.6  26.0 - 34.0 pg   MCHC 33.9  30.0 - 36.0 g/dL   RDW 78.2  95.6 - 21.3 %   Platelets 158  150 - 400 K/uL   Neutrophils Relative % 51  43 - 77 %   Neutro Abs 2.5  1.7 - 7.7 K/uL   Lymphocytes Relative 33  12 - 46 %   Lymphs Abs 1.6  0.7 - 4.0 K/uL   Monocytes Relative 11  3 - 12 %   Monocytes Absolute 0.5  0.1 - 1.0 K/uL   Eosinophils Relative 5  0 - 5 %   Eosinophils Absolute 0.2  0.0 - 0.7 K/uL   Basophils Relative 1  0 - 1 %   Basophils Absolute 0.0  0.0 - 0.1 K/uL  SURGICAL PCR SCREEN     Status: Abnormal   Collection Time    11/16/13 12:28 PM      Result Value Range   MRSA, PCR NEGATIVE  NEGATIVE   Staphylococcus aureus POSITIVE (*) NEGATIVE   Comment:            The Xpert SA Assay (FDA     approved for NASAL specimens     in patients over 53 years of age),     is one component of     a comprehensive  surveillance     program.  Test performance has     been validated by [employer] for patients greater     than or equal to 87 year old.     It is not intended     to diagnose infection nor to     guide or monitor treatment.   No results found.  Review of Systems  Constitutional: Negative.   HENT: Negative.   Eyes: Negative.   Respiratory: Negative.   Cardiovascular: Negative.   Gastrointestinal: Negative.   Genitourinary: Negative.   Musculoskeletal: Negative.   Skin: Negative.   Neurological: Negative.   Endo/Heme/Allergies: Negative.   Psychiatric/Behavioral: Negative.     Blood pressure 158/70, pulse 69, temperature 97.3 F (36.3 C), temperature source Oral, resp. rate 20, height 5' 2.5" (1.588 m), weight 116 kg (255 lb 11.7 oz), SpO2 97.00%. Physical Exam  Constitutional: She is oriented to person, place, and time. She appears well-developed and well-nourished. No distress.  HENT:  Head: Normocephalic and atraumatic.  Right Ear: External ear normal.  Left Ear: External ear normal.  Nose: Nose normal.  Mouth/Throat: Oropharynx is clear and moist. No oropharyngeal exudate.  Eyes: Conjunctivae and EOM are normal. Pupils are equal, round, and reactive to light. Right eye exhibits no discharge. Left eye exhibits no discharge.  Neck: Normal range of motion. Neck supple. No tracheal deviation present. No thyromegaly present.  Cardiovascular: Normal rate, regular rhythm, normal heart sounds and intact distal pulses.   No murmur heard. Respiratory: Effort normal and breath sounds normal. No respiratory distress. She has no wheezes.  GI: Soft. Bowel sounds are normal. She exhibits no distension. There is no tenderness.  Musculoskeletal: Normal range of  motion. She exhibits no edema and no tenderness.  Neurological: She is alert and oriented to person, place, and time. She has normal reflexes. No cranial nerve deficit. Coordination normal.  Skin: Skin is warm and dry. No  rash noted. She is not diaphoretic. No erythema. No pallor.  Psychiatric: She has a normal mood and affect. Her behavior is normal. Judgment and thought content normal.     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 Plan right L4-5 decompressive laminotomy and foraminotomy. Risks and benefits have been explained. Patient wishes to proceed.  Alem Fahl A 11/16/2013, 2:32 PM

## 2013-11-16 NOTE — Brief Op Note (Signed)
11/16/2013  4:31 PM  PATIENT:  Emily Mcbride  72 y.o. female  PRE-OPERATIVE DIAGNOSIS:  stenosis  POST-OPERATIVE DIAGNOSIS:  stenosis  PROCEDURE:  Procedure(s) with comments: LUMBAR LAMINECTOMY/Laminotomy/DECOMPRESSION MICRODISCECTOMY RIGHT LUMBAR FOUR-FIVE (Right) - LUMBAR LAMINECTOMY/Laminotomy/DECOMPRESSION MICRODISCECTOMY RIGHT LUMBAR FOUR-FIVE  SURGEON:  Surgeon(s) and Role:    * Temple Pacini, MD - Primary    * Barnett Abu, MD - Assisting  PHYSICIAN ASSISTANT:   ASSISTANTS:    ANESTHESIA:   general  EBL:  Total I/O In: 1000 [I.V.:1000] Out: 50 [Blood:50]  BLOOD ADMINISTERED:none  DRAINS: (Medium) Hemovact drain(s) in the Epidural space with  Suction Open   LOCAL MEDICATIONS USED:  MARCAINE     SPECIMEN:  No Specimen  DISPOSITION OF SPECIMEN:  N/A  COUNTS:  YES  TOURNIQUET:  * No tourniquets in log *  DICTATION: .Dragon Dictation  PLAN OF CARE: Admit for overnight observation  PATIENT DISPOSITION:  PACU - hemodynamically stable.   Delay start of Pharmacological VTE agent (>24hrs) due to surgical blood loss or risk of bleeding: yes

## 2013-11-16 NOTE — Anesthesia Preprocedure Evaluation (Addendum)
Anesthesia Evaluation  Patient identified by MRN, date of birth, ID band Patient awake    Reviewed: Allergy & Precautions, H&P , Patient's Chart, lab work & pertinent test results, reviewed documented beta blocker date and time   History of Anesthesia Complications Negative for: history of anesthetic complications  Airway Mallampati: II      Dental  (+) Poor Dentition, Missing, Chipped and Dental Advisory Given   Pulmonary sleep apnea (does not use CPAP) ,  breath sounds clear to auscultation  Pulmonary exam normal       Cardiovascular hypertension, Pt. on medications and Pt. on home beta blockers Rhythm:Regular Rate:Normal  '05 cath: normal coronaries, normal LV   Neuro/Psych    GI/Hepatic Neg liver ROS, GERD-  Medicated and Controlled,  Endo/Other  Morbid obesity  Renal/GU negative Renal ROS     Musculoskeletal  (+) Fibromyalgia -  Abdominal (+) + obese,   Peds  Hematology negative hematology ROS (+)   Anesthesia Other Findings   Reproductive/Obstetrics                        Anesthesia Physical Anesthesia Plan  ASA: III  Anesthesia Plan: General   Post-op Pain Management:    Induction: Intravenous  Airway Management Planned: Oral ETT  Additional Equipment:   Intra-op Plan:   Post-operative Plan: Extubation in OR  Informed Consent: I have reviewed the patients History and Physical, chart, labs and discussed the procedure including the risks, benefits and alternatives for the proposed anesthesia with the patient or authorized representative who has indicated his/her understanding and acceptance.   Dental advisory given  Plan Discussed with: CRNA and Surgeon  Anesthesia Plan Comments: (Plan routine monitors, GETA)        Anesthesia Quick Evaluation

## 2013-11-16 NOTE — Op Note (Signed)
Date of procedure: 11/16/2013  Date of dictation: Same  Service: Neurosurgery  Preoperative diagnosis: L4-5 stenosis with neurogenic claudication  Postoperative diagnosis: Same  Procedure Name: Right L4-5 decompressive laminotomy with right L4 and L5 decompressive foraminotomies  Surgeon:Trenice Mesa A.Tanyla Stege, M.D.  Asst. Surgeon: Danielle Dess  Anesthesia: General  Indication: The patient is a 72 year old female with severe right lower extremity pain paresthesias and weakness consistent with a mix lumbar radiculopathy thought to be secondary to stenosis at L4-5. Patient's failed conservative management and presents now for right L4-5 decompressive laminotomy and foraminotomies in hopes of improving her symptoms.  Operative note: After induction anesthesia, patient positioned prone onto Wilson frame and appropriately padded. Lumbar region prepped and draped. Incision made overlying L4-5. Subperiosteal dissection performed on the left side. Laminotomy performed using high-speed drill and Kerrison rongeurs. Ligamentum flavum elevated and resected piecemeal fashion. Underlying thecal sac was identified. Decompressive foraminotomies were then performed on course exiting L4 and L5 nerve roots on the right side. Midline structures were undercut using Kerrison rongeurs. At least a substantial partial decompression the left side was also undertaken from this approach. At this point a very thorough decompression been achieved. There is no his injury to thecal sac and nerve roots. Wound is then irrigated with saline solution. Gelfoam was placed topically for hemostasis. Medium Hemovac drain was left at per space. Wounds and close in layers with Vicryl sutures. Steri-Strips and sterile dressing were applied. There were no apparent complications. Patient tolerated the procedure well and she returns to the recovery postop.

## 2013-11-17 MED ORDER — HYDROCODONE-ACETAMINOPHEN 5-325 MG PO TABS: 1.0000 | ORAL_TABLET | ORAL | Status: DC | PRN

## 2013-11-17 MED ORDER — CYCLOBENZAPRINE HCL 10 MG PO TABS: 10.0000 mg | ORAL_TABLET | Freq: Three times a day (TID) | ORAL | Status: DC | PRN

## 2013-11-17 NOTE — Discharge Summary (Signed)
Physician Discharge Summary  Patient ID: NILE DORNING MRN: [id number] DOB/AGE: 1941-01-21 72 y.o.  Admit date: 11/16/2013 Discharge date: 11/17/2013  Admission Diagnoses:  Discharge Diagnoses:  Principal Problem:   Spinal stenosis, lumbar region, with neurogenic claudication   Discharged Condition: good  Hospital Course: Patient admitted to the hospital where she underwent uncomplicated  Consults:   Significant Diagnostic Studies:   Treatments:   Discharge Exam: Blood pressure 96/54, pulse 70, temperature 99.3 F (37.4 C), temperature source Oral, resp. rate 18, height 5' 2.5" (1.588 m), weight 116 kg (255 lb 11.7 oz), SpO2 93.00%. Awake and alert. Oriented and appropriate. Cranial nerve function intact. Motor and sensory function extremities normal. Wound clean and dry. Chest and abdomen benign.  Disposition:      Medication List         aspirin EC 81 MG tablet  Take 81 mg by mouth daily.     cyclobenzaprine 10 MG tablet  Commonly known as:  FLEXERIL  Take 1 tablet (10 mg total) by mouth 3 (three) times daily as needed for muscle spasms.     HYDROcodone-acetaminophen 5-325 MG per tablet  Commonly known as:  NORCO/VICODIN  Take 1-2 tablets by mouth every 4 (four) hours as needed for moderate pain.     meloxicam 15 MG tablet  Commonly known as:  MOBIC  Take 15 mg by mouth daily.     mesalamine 0.375 G 24 hr capsule  Commonly known as:  APRISO  Take 1.5 g by mouth daily.     nebivolol 10 MG tablet  Commonly known as:  BYSTOLIC  Take 10 mg by mouth daily.     pregabalin 75 MG capsule  Commonly known as:  LYRICA  Take 150 mg by mouth 2 (two) times daily.     sertraline 50 MG tablet  Commonly known as:  ZOLOFT  Take 50 mg by mouth daily.     triamcinolone cream 0.1 %  Commonly known as:  KENALOG  Apply 1 application topically 2 (two) times daily as needed (rash).           Follow-up Information   Follow up with Temple Pacini, MD.   Specialty:   Neurosurgery   Contact information:   [address] [place] [address] [phone]       Signed: Roshan Salamon A 11/17/2013, 9:13 AM

## 2013-11-17 NOTE — Progress Notes (Signed)
Pt and husband given D/C instructions with Rx's, verbal understanding was given. Pt stable @ D/C and she had no other needs @ this time. Pt D/C'd home via wheelchair @ [phone] per MD order. Rema Fendt, RN

## 2013-11-20 ENCOUNTER — Encounter (HOSPITAL_COMMUNITY): Payer: Self-pay | Admitting: Neurosurgery

## 2014-06-24 ENCOUNTER — Ambulatory Visit: Payer: Medicare PPO | Attending: Neurosurgery | Admitting: Physical Therapy

## 2014-06-24 DIAGNOSIS — M545 Low back pain, unspecified: Secondary | ICD-10-CM | POA: Diagnosis not present

## 2014-06-24 DIAGNOSIS — M48061 Spinal stenosis, lumbar region without neurogenic claudication: Secondary | ICD-10-CM | POA: Diagnosis not present

## 2014-06-24 DIAGNOSIS — I10 Essential (primary) hypertension: Secondary | ICD-10-CM | POA: Insufficient documentation

## 2014-06-24 DIAGNOSIS — R5381 Other malaise: Secondary | ICD-10-CM | POA: Diagnosis not present

## 2014-06-24 DIAGNOSIS — IMO0001 Reserved for inherently not codable concepts without codable children: Secondary | ICD-10-CM | POA: Insufficient documentation

## 2014-06-29 ENCOUNTER — Ambulatory Visit: Payer: Medicare PPO | Admitting: Physical Therapy

## 2014-06-29 DIAGNOSIS — IMO0001 Reserved for inherently not codable concepts without codable children: Secondary | ICD-10-CM | POA: Diagnosis not present

## 2014-07-05 ENCOUNTER — Ambulatory Visit: Payer: Medicare PPO | Attending: Neurosurgery | Admitting: Physical Therapy

## 2014-07-05 DIAGNOSIS — I10 Essential (primary) hypertension: Secondary | ICD-10-CM | POA: Insufficient documentation

## 2014-07-05 DIAGNOSIS — IMO0001 Reserved for inherently not codable concepts without codable children: Secondary | ICD-10-CM | POA: Insufficient documentation

## 2014-07-05 DIAGNOSIS — R5381 Other malaise: Secondary | ICD-10-CM | POA: Insufficient documentation

## 2014-07-05 DIAGNOSIS — M545 Low back pain, unspecified: Secondary | ICD-10-CM | POA: Insufficient documentation

## 2014-07-05 DIAGNOSIS — M48061 Spinal stenosis, lumbar region without neurogenic claudication: Secondary | ICD-10-CM | POA: Insufficient documentation

## 2014-07-08 ENCOUNTER — Ambulatory Visit: Payer: Medicare PPO | Admitting: Physical Therapy

## 2014-07-08 DIAGNOSIS — IMO0001 Reserved for inherently not codable concepts without codable children: Secondary | ICD-10-CM | POA: Diagnosis not present

## 2014-07-12 ENCOUNTER — Ambulatory Visit: Payer: Medicare PPO | Admitting: Physical Therapy

## 2014-07-12 DIAGNOSIS — IMO0001 Reserved for inherently not codable concepts without codable children: Secondary | ICD-10-CM | POA: Diagnosis not present

## 2014-07-15 ENCOUNTER — Ambulatory Visit: Payer: Medicare PPO | Admitting: *Deleted

## 2014-07-15 DIAGNOSIS — IMO0001 Reserved for inherently not codable concepts without codable children: Secondary | ICD-10-CM | POA: Diagnosis not present

## 2014-07-19 ENCOUNTER — Ambulatory Visit: Payer: Medicare PPO | Admitting: Physical Therapy

## 2014-07-19 DIAGNOSIS — IMO0001 Reserved for inherently not codable concepts without codable children: Secondary | ICD-10-CM | POA: Diagnosis not present

## 2014-07-21 ENCOUNTER — Ambulatory Visit: Payer: Medicare PPO | Admitting: Physical Therapy

## 2014-07-21 DIAGNOSIS — IMO0001 Reserved for inherently not codable concepts without codable children: Secondary | ICD-10-CM | POA: Diagnosis not present

## 2016-04-09 ENCOUNTER — Other Ambulatory Visit: Payer: Self-pay | Admitting: Neurosurgery

## 2016-04-09 DIAGNOSIS — M48062 Spinal stenosis, lumbar region with neurogenic claudication: Secondary | ICD-10-CM

## 2016-04-10 ENCOUNTER — Other Ambulatory Visit: Payer: Self-pay | Admitting: Adult Health Nurse Practitioner

## 2016-04-10 ENCOUNTER — Ambulatory Visit
Admission: RE | Admit: 2016-04-10 | Discharge: 2016-04-10 | Disposition: A | Discharge status: Home / Self Care | Payer: Medicare Other | Source: Ambulatory Visit | Attending: Neurosurgery | Admitting: Neurosurgery

## 2016-04-10 DIAGNOSIS — M48062 Spinal stenosis, lumbar region with neurogenic claudication: Secondary | ICD-10-CM

## 2016-04-10 DIAGNOSIS — R921 Mammographic calcification found on diagnostic imaging of breast: Secondary | ICD-10-CM

## 2016-04-10 DIAGNOSIS — Z780 Asymptomatic menopausal state: Secondary | ICD-10-CM

## 2016-04-27 ENCOUNTER — Ambulatory Visit
Admission: RE | Admit: 2016-04-27 | Discharge: 2016-04-27 | Disposition: A | Discharge status: Home / Self Care | Payer: Medicare Other | Source: Ambulatory Visit | Attending: Adult Health Nurse Practitioner | Admitting: Adult Health Nurse Practitioner

## 2016-04-27 DIAGNOSIS — Z780 Asymptomatic menopausal state: Secondary | ICD-10-CM

## 2016-04-27 DIAGNOSIS — R921 Mammographic calcification found on diagnostic imaging of breast: Secondary | ICD-10-CM

## 2018-04-15 ENCOUNTER — Other Ambulatory Visit: Payer: Self-pay | Admitting: Adult Health Nurse Practitioner

## 2018-04-15 DIAGNOSIS — M8580 Other specified disorders of bone density and structure, unspecified site: Secondary | ICD-10-CM

## 2018-05-15 ENCOUNTER — Ambulatory Visit
Admission: RE | Admit: 2018-05-15 | Discharge: 2018-05-15 | Disposition: A | Discharge status: Home / Self Care | Payer: Medicare Other | Source: Ambulatory Visit | Attending: Adult Health Nurse Practitioner | Admitting: Adult Health Nurse Practitioner

## 2018-05-15 DIAGNOSIS — M8580 Other specified disorders of bone density and structure, unspecified site: Secondary | ICD-10-CM

## 2018-07-28 ENCOUNTER — Ambulatory Visit
Admission: RE | Admit: 2018-07-28 | Discharge: 2018-07-28 | Disposition: A | Discharge status: Home / Self Care | Payer: Medicare Other | Source: Ambulatory Visit | Attending: Neurosurgery | Admitting: Neurosurgery

## 2018-07-28 ENCOUNTER — Other Ambulatory Visit: Payer: Self-pay | Admitting: Neurosurgery

## 2018-07-28 DIAGNOSIS — M48062 Spinal stenosis, lumbar region with neurogenic claudication: Secondary | ICD-10-CM

## 2018-07-28 MED ORDER — GADOBENATE DIMEGLUMINE 529 MG/ML IV SOLN
20.0000 mL | Freq: Once | INTRAVENOUS | Status: AC | PRN
  Administered 2018-07-28: 20 mL via INTRAVENOUS

## 2018-09-26 ENCOUNTER — Other Ambulatory Visit: Payer: Self-pay | Admitting: Neurosurgery

## 2018-09-30 NOTE — Pre-Procedure Instructions (Signed)
Emily Mcbride  09/30/2018      [facility] [address], [place] - [address] [place] [address] Phone: [phone] Fax: [phone]    Your procedure is scheduled on October 09, 2018.  Report to [facility] Admitting at 615 AM.  Call this number if you have problems the morning of surgery:  [phone]   Remember:  Do not eat or drink after midnight.    Take these medicines the morning of surgery with A SIP OF WATER  Nebivolol-bystolic  Tylenol-if needed pregabalin (lyrica) Sertraline (zoloft)  Follow your surgeon's instructions on when to hold/resume aspirin.  If no instructions were given call the office to determine how they would like to you take aspirin  7 days prior to surgery STOP taking any meloxicam (mobic), Aleve, Naproxen, Ibuprofen, Motrin, Advil, Goody's, BC's, all herbal medications, fish oil, and all vitamins    Do not wear jewelry, make-up or nail polish.  Do not wear lotions, powders, or perfumes, or deodorant.  Do not shave 48 hours prior to surgery.    Do not bring valuables to the hospital.  [facility] is not responsible for any belongings or valuables.  Contacts, dentures or bridgework may not be worn into surgery.  Leave your suitcase in the car.  After surgery it may be brought to your room.  For patients admitted to the hospital, discharge time will be determined by your treatment team.  Patients discharged the day of surgery will not be allowed to drive home.    [place]- Preparing For Surgery  Before surgery, you can play an important role. Because skin is not sterile, your skin needs to be as free of germs as possible. You can reduce the number of germs on your skin by washing with CHG (chlorahexidine gluconate) Soap before surgery.  CHG is an antiseptic cleaner which kills germs and bonds with the skin to continue killing germs even after washing.    Oral Hygiene is also important to  reduce your risk of infection.  Remember - BRUSH YOUR TEETH THE MORNING OF SURGERY WITH YOUR REGULAR TOOTHPASTE  Please do not use if you have an allergy to CHG or antibacterial soaps. If your skin becomes reddened/irritated stop using the CHG.  Do not shave (including legs and underarms) for at least 48 hours prior to first CHG shower. It is OK to shave your face.  Please follow these instructions carefully.   1. Shower the NIGHT BEFORE SURGERY and the MORNING OF SURGERY with CHG.   2. If you chose to wash your hair, wash your hair first as usual with your normal shampoo.  3. After you shampoo, rinse your hair and body thoroughly to remove the shampoo.  4. Use CHG as you would any other liquid soap. You can apply CHG directly to the skin and wash gently with a scrungie or a clean washcloth.   5. Apply the CHG Soap to your body ONLY FROM THE NECK DOWN.  Do not use on open wounds or open sores. Avoid contact with your eyes, ears, mouth and genitals (private parts). Wash Face and genitals (private parts)  with your normal soap.  6. Wash thoroughly, paying special attention to the area where your surgery will be performed.  7. Thoroughly rinse your body with warm water from the neck down.  8. DO NOT shower/wash with your normal soap after using and rinsing off the CHG Soap.  9.  Pat yourself dry with a CLEAN TOWEL.  10. Wear CLEAN PAJAMAS to bed the night before surgery, wear comfortable clothes the morning of surgery  11. Place CLEAN SHEETS on your bed the night of your first shower and DO NOT SLEEP WITH PETS.  Day of Surgery:  Do not apply any deodorants/lotions.  Please wear clean clothes to the hospital/surgery center.   Remember to brush your teeth WITH YOUR REGULAR TOOTHPASTE.  Please read over the following fact sheets that you were given. Pain Booklet, Coughing and Deep Breathing, MRSA Information and Surgical Site Infection Prevention

## 2018-10-01 ENCOUNTER — Encounter (HOSPITAL_COMMUNITY): Payer: Self-pay

## 2018-10-01 ENCOUNTER — Other Ambulatory Visit: Payer: Self-pay

## 2018-10-01 ENCOUNTER — Encounter (HOSPITAL_COMMUNITY)
Admission: RE | Admit: 2018-10-01 | Discharge: 2018-10-01 | Disposition: A | Discharge status: Home / Self Care | Payer: Medicare Other | Source: Ambulatory Visit | Attending: Neurosurgery | Admitting: Neurosurgery

## 2018-10-01 DIAGNOSIS — Z01818 Encounter for other preprocedural examination: Secondary | ICD-10-CM | POA: Insufficient documentation

## 2018-10-01 LAB — BASIC METABOLIC PANEL
ANION GAP: 6 (ref 5–15)
BUN: 27 mg/dL — ABNORMAL HIGH (ref 8–23)
CHLORIDE: 107 mmol/L (ref 98–111)
CO2: 27 mmol/L (ref 22–32)
Calcium: 9.5 mg/dL (ref 8.9–10.3)
Creatinine, Ser: 0.86 mg/dL (ref 0.44–1.00)
GFR calc Af Amer: >60 mL/min (ref >60)
GFR calc non Af Amer: >60 mL/min (ref >60)
Glucose, Bld: 101 mg/dL — ABNORMAL HIGH (ref 70–99)
POTASSIUM: 4 mmol/L (ref 3.5–5.1)
SODIUM: 140 mmol/L (ref 135–145)

## 2018-10-01 LAB — SURGICAL PCR SCREEN
MRSA, PCR: NEGATIVE
Staphylococcus aureus: NEGATIVE

## 2018-10-01 LAB — CBC
HCT: 38.7 % (ref 36.0–46.0)
HEMOGLOBIN: 12.2 g/dL (ref 12.0–15.0)
MCH: 31.4 pg (ref 26.0–34.0)
MCHC: 31.5 g/dL (ref 30.0–36.0)
MCV: 99.7 fL (ref 78.0–100.0)
PLATELETS: 168 10*3/uL (ref 150–400)
RBC: 3.88 MIL/uL (ref 3.87–5.11)
RDW: 12.5 % (ref 11.5–15.5)
WBC: 5.4 10*3/uL (ref 4.0–10.5)

## 2018-10-01 NOTE — Progress Notes (Addendum)
PCP: Sharon Seller NP Cardiologist: Denies  EKG: Today CXR: Denies ECHO: Denies Stress Test: >20 years ago Cardiac Cath: >20 years ago--doesn't remember location or doctor's name.  Reports "everything with my heart was fine, just told to lose weight." Sleep Study: >20 years ago, diagnosed with OSA, does not use CPAP  Reports MD told her to stop ASA on 10/02/17   Patient denies shortness of breath, fever, cough, and chest pain at PAT appointment.  Patient verbalized understanding of instructions provided today at the PAT appointment.  Patient asked to review instructions at home and day of surgery.

## 2018-10-09 ENCOUNTER — Ambulatory Visit (HOSPITAL_COMMUNITY): Payer: Medicare Other | Admitting: Physician Assistant

## 2018-10-09 ENCOUNTER — Ambulatory Visit (HOSPITAL_COMMUNITY): Payer: Medicare Other

## 2018-10-09 ENCOUNTER — Ambulatory Visit (HOSPITAL_COMMUNITY): Payer: Medicare Other | Admitting: Certified Registered Nurse Anesthetist

## 2018-10-09 ENCOUNTER — Encounter (HOSPITAL_COMMUNITY)
Admission: RE | Disposition: A | Discharge status: Home / Self Care | Payer: Self-pay | Source: Ambulatory Visit | Attending: Neurosurgery

## 2018-10-09 ENCOUNTER — Observation Stay (HOSPITAL_COMMUNITY)
Admission: RE | Admit: 2018-10-09 | Discharge: 2018-10-10 | Disposition: A | Discharge status: Home / Self Care | Payer: Medicare Other | Source: Ambulatory Visit | Attending: Neurosurgery | Admitting: Neurosurgery

## 2018-10-09 ENCOUNTER — Encounter (HOSPITAL_COMMUNITY): Payer: Self-pay

## 2018-10-09 DIAGNOSIS — Z7982 Long term (current) use of aspirin: Secondary | ICD-10-CM | POA: Diagnosis not present

## 2018-10-09 DIAGNOSIS — Z882 Allergy status to sulfonamides status: Secondary | ICD-10-CM | POA: Diagnosis not present

## 2018-10-09 DIAGNOSIS — M1990 Unspecified osteoarthritis, unspecified site: Secondary | ICD-10-CM | POA: Insufficient documentation

## 2018-10-09 DIAGNOSIS — Z419 Encounter for procedure for purposes other than remedying health state, unspecified: Secondary | ICD-10-CM

## 2018-10-09 DIAGNOSIS — Z79899 Other long term (current) drug therapy: Secondary | ICD-10-CM | POA: Diagnosis not present

## 2018-10-09 DIAGNOSIS — I10 Essential (primary) hypertension: Secondary | ICD-10-CM | POA: Diagnosis not present

## 2018-10-09 DIAGNOSIS — M4807 Spinal stenosis, lumbosacral region: Secondary | ICD-10-CM | POA: Diagnosis not present

## 2018-10-09 DIAGNOSIS — M48061 Spinal stenosis, lumbar region without neurogenic claudication: Secondary | ICD-10-CM | POA: Diagnosis present

## 2018-10-09 DIAGNOSIS — M797 Fibromyalgia: Secondary | ICD-10-CM | POA: Diagnosis not present

## 2018-10-09 DIAGNOSIS — M5416 Radiculopathy, lumbar region: Secondary | ICD-10-CM | POA: Diagnosis not present

## 2018-10-09 HISTORY — PX: LUMBAR LAMINECTOMY/DECOMPRESSION MICRODISCECTOMY: SHX5026

## 2018-10-09 SURGERY — LUMBAR LAMINECTOMY/DECOMPRESSION MICRODISCECTOMY 1 LEVEL
Anesthesia: General | Site: Back | Laterality: Right

## 2018-10-09 MED ORDER — CEFAZOLIN SODIUM-DEXTROSE 1-4 GM/50ML-% IV SOLN
1.0000 g | Freq: Three times a day (TID) | INTRAVENOUS | Status: AC
  Administered 2018-10-09 (×2): 1 g via INTRAVENOUS
  Filled 2018-10-09 (×2): qty 50

## 2018-10-09 MED ORDER — SERTRALINE HCL 50 MG PO TABS
50.0000 mg | ORAL_TABLET | Freq: Every day | ORAL | Status: DC
  Administered 2018-10-09 – 2018-10-10 (×2): 50 mg via ORAL
  Filled 2018-10-09 (×2): qty 1

## 2018-10-09 MED ORDER — ROCURONIUM BROMIDE 10 MG/ML (PF) SYRINGE
PREFILLED_SYRINGE | INTRAVENOUS | Status: DC | PRN
  Administered 2018-10-09: 50 mg via INTRAVENOUS

## 2018-10-09 MED ORDER — DEXAMETHASONE SODIUM PHOSPHATE 10 MG/ML IJ SOLN
INTRAMUSCULAR | Status: AC
  Filled 2018-10-09: qty 1

## 2018-10-09 MED ORDER — FENTANYL CITRATE (PF) 250 MCG/5ML IJ SOLN
INTRAMUSCULAR | Status: AC
  Filled 2018-10-09: qty 5

## 2018-10-09 MED ORDER — HEMOSTATIC AGENTS (NO CHARGE) OPTIME
TOPICAL | Status: DC | PRN
  Administered 2018-10-09: 1 via TOPICAL

## 2018-10-09 MED ORDER — THROMBIN 5000 UNITS EX SOLR
CUTANEOUS | Status: AC
  Filled 2018-10-09: qty 15000

## 2018-10-09 MED ORDER — OXYCODONE HCL 5 MG/5ML PO SOLN: 5.0000 mg | Freq: Once | ORAL | Status: DC | PRN

## 2018-10-09 MED ORDER — SODIUM CHLORIDE 0.9 % IV SOLN: 250.0000 mL | INTRAVENOUS | Status: DC

## 2018-10-09 MED ORDER — HYDROMORPHONE HCL 1 MG/ML IJ SOLN: 1.0000 mg | INTRAMUSCULAR | Status: DC | PRN

## 2018-10-09 MED ORDER — CALCIUM CARBONATE 1500 (600 CA) MG PO TABS
600.0000 mg | ORAL_TABLET | Freq: Every day | ORAL | Status: DC
  Filled 2018-10-09: qty 1

## 2018-10-09 MED ORDER — DEXAMETHASONE SODIUM PHOSPHATE 10 MG/ML IJ SOLN
INTRAMUSCULAR | Status: DC | PRN
  Administered 2018-10-09: 8 mg via INTRAVENOUS

## 2018-10-09 MED ORDER — HYDROMORPHONE HCL 1 MG/ML IJ SOLN
INTRAMUSCULAR | Status: AC
  Filled 2018-10-09: qty 1

## 2018-10-09 MED ORDER — SODIUM CHLORIDE 0.9% FLUSH: 3.0000 mL | INTRAVENOUS | Status: DC | PRN

## 2018-10-09 MED ORDER — PREGABALIN 75 MG PO CAPS
75.0000 mg | ORAL_CAPSULE | Freq: Every day | ORAL | Status: DC
  Administered 2018-10-09 – 2018-10-10 (×2): 75 mg via ORAL
  Filled 2018-10-09 (×2): qty 1

## 2018-10-09 MED ORDER — ACETAMINOPHEN 10 MG/ML IV SOLN
INTRAVENOUS | Status: DC | PRN
  Administered 2018-10-09: 1000 mg via INTRAVENOUS

## 2018-10-09 MED ORDER — PROPOFOL 10 MG/ML IV BOLUS
INTRAVENOUS | Status: AC
  Filled 2018-10-09: qty 20

## 2018-10-09 MED ORDER — ADULT MULTIVITAMIN W/MINERALS CH
1.0000 | ORAL_TABLET | Freq: Every day | ORAL | Status: DC
  Administered 2018-10-09: 1 via ORAL
  Filled 2018-10-09: qty 1

## 2018-10-09 MED ORDER — OXYCODONE HCL 5 MG PO TABS: 5.0000 mg | ORAL_TABLET | Freq: Once | ORAL | Status: DC | PRN

## 2018-10-09 MED ORDER — EPHEDRINE SULFATE-NACL 50-0.9 MG/10ML-% IV SOSY
PREFILLED_SYRINGE | INTRAVENOUS | Status: DC | PRN
  Administered 2018-10-09 (×2): 5 mg via INTRAVENOUS
  Administered 2018-10-09: 10 mg via INTRAVENOUS
  Administered 2018-10-09: 5 mg via INTRAVENOUS

## 2018-10-09 MED ORDER — HYDROCODONE-ACETAMINOPHEN 10-325 MG PO TABS: 2.0000 | ORAL_TABLET | ORAL | Status: DC | PRN

## 2018-10-09 MED ORDER — CALCIUM CARBONATE 1250 (500 CA) MG PO TABS
1.0000 | ORAL_TABLET | Freq: Every day | ORAL | Status: DC
  Administered 2018-10-10: 500 mg via ORAL
  Filled 2018-10-09: qty 1

## 2018-10-09 MED ORDER — SUGAMMADEX SODIUM 200 MG/2ML IV SOLN
INTRAVENOUS | Status: DC | PRN
  Administered 2018-10-09: 200 mg via INTRAVENOUS

## 2018-10-09 MED ORDER — DEXMEDETOMIDINE HCL 200 MCG/2ML IV SOLN
INTRAVENOUS | Status: DC | PRN
  Administered 2018-10-09: 8 ug via INTRAVENOUS

## 2018-10-09 MED ORDER — BUPIVACAINE HCL (PF) 0.25 % IJ SOLN
INTRAMUSCULAR | Status: DC | PRN
  Administered 2018-10-09: 20 mL

## 2018-10-09 MED ORDER — ONDANSETRON HCL 4 MG/2ML IJ SOLN
INTRAMUSCULAR | Status: AC
  Filled 2018-10-09: qty 2

## 2018-10-09 MED ORDER — KETOROLAC TROMETHAMINE 30 MG/ML IJ SOLN
INTRAMUSCULAR | Status: DC | PRN
  Administered 2018-10-09: 30 mg via INTRAVENOUS

## 2018-10-09 MED ORDER — GLYCOPYRROLATE PF 0.2 MG/ML IJ SOSY
PREFILLED_SYRINGE | INTRAMUSCULAR | Status: AC
  Filled 2018-10-09: qty 1

## 2018-10-09 MED ORDER — ONDANSETRON HCL 4 MG PO TABS: 4.0000 mg | ORAL_TABLET | Freq: Four times a day (QID) | ORAL | Status: DC | PRN

## 2018-10-09 MED ORDER — LIDOCAINE 2% (20 MG/ML) 5 ML SYRINGE
INTRAMUSCULAR | Status: AC
  Filled 2018-10-09: qty 5

## 2018-10-09 MED ORDER — LACTATED RINGERS IV SOLN
INTRAVENOUS | Status: DC | PRN
  Administered 2018-10-09: 08:00:00 via INTRAVENOUS

## 2018-10-09 MED ORDER — CHLORHEXIDINE GLUCONATE CLOTH 2 % EX PADS: 6.0000 | MEDICATED_PAD | Freq: Once | CUTANEOUS | Status: DC

## 2018-10-09 MED ORDER — CEFAZOLIN SODIUM-DEXTROSE 2-4 GM/100ML-% IV SOLN
2.0000 g | INTRAVENOUS | Status: AC
  Administered 2018-10-09: 2 g via INTRAVENOUS
  Filled 2018-10-09: qty 100

## 2018-10-09 MED ORDER — MENTHOL (TOPICAL ANALGESIC) 2.5 % EX LIQD: Freq: Every day | CUTANEOUS | Status: DC | PRN

## 2018-10-09 MED ORDER — PHENOL 1.4 % MT LIQD: 1.0000 | OROMUCOSAL | Status: DC | PRN

## 2018-10-09 MED ORDER — ACETAMINOPHEN 325 MG PO TABS: 650.0000 mg | ORAL_TABLET | ORAL | Status: DC | PRN

## 2018-10-09 MED ORDER — PROMETHAZINE HCL 25 MG/ML IJ SOLN: 6.2500 mg | INTRAMUSCULAR | Status: DC | PRN

## 2018-10-09 MED ORDER — KETOROLAC TROMETHAMINE 15 MG/ML IJ SOLN
30.0000 mg | Freq: Four times a day (QID) | INTRAMUSCULAR | Status: AC
  Administered 2018-10-09 – 2018-10-10 (×4): 30 mg via INTRAVENOUS
  Filled 2018-10-09 (×4): qty 2

## 2018-10-09 MED ORDER — ESMOLOL HCL 100 MG/10ML IV SOLN
INTRAVENOUS | Status: AC
  Filled 2018-10-09: qty 10

## 2018-10-09 MED ORDER — THROMBIN 5000 UNITS EX SOLR
CUTANEOUS | Status: AC
  Filled 2018-10-09: qty 5000

## 2018-10-09 MED ORDER — LACTATED RINGERS IV SOLN
INTRAVENOUS | Status: DC
  Administered 2018-10-09: 08:00:00 via INTRAVENOUS

## 2018-10-09 MED ORDER — SODIUM CHLORIDE 0.9 % IV SOLN
INTRAVENOUS | Status: DC | PRN
  Administered 2018-10-09: 500 mL

## 2018-10-09 MED ORDER — FENTANYL CITRATE (PF) 100 MCG/2ML IJ SOLN
INTRAMUSCULAR | Status: DC | PRN
  Administered 2018-10-09 (×3): 25 ug via INTRAVENOUS
  Administered 2018-10-09: 50 ug via INTRAVENOUS
  Administered 2018-10-09: 75 ug via INTRAVENOUS

## 2018-10-09 MED ORDER — CYCLOBENZAPRINE HCL 10 MG PO TABS
10.0000 mg | ORAL_TABLET | Freq: Three times a day (TID) | ORAL | Status: DC | PRN
  Administered 2018-10-09: 10 mg via ORAL
  Filled 2018-10-09: qty 1

## 2018-10-09 MED ORDER — LIDOCAINE 2% (20 MG/ML) 5 ML SYRINGE
INTRAMUSCULAR | Status: DC | PRN
  Administered 2018-10-09: 80 mg via INTRAVENOUS

## 2018-10-09 MED ORDER — LACTATED RINGERS IV SOLN: INTRAVENOUS | Status: DC

## 2018-10-09 MED ORDER — THROMBIN 5000 UNITS EX SOLR
CUTANEOUS | Status: DC | PRN
  Administered 2018-10-09 (×2): 5000 [IU] via TOPICAL

## 2018-10-09 MED ORDER — ROCURONIUM BROMIDE 50 MG/5ML IV SOSY
PREFILLED_SYRINGE | INTRAVENOUS | Status: AC
  Filled 2018-10-09: qty 5

## 2018-10-09 MED ORDER — NEBIVOLOL HCL 10 MG PO TABS
10.0000 mg | ORAL_TABLET | Freq: Every day | ORAL | Status: DC
Start: 2018-10-09 — End: 2018-10-10
  Administered 2018-10-09 – 2018-10-10 (×2): 10 mg via ORAL
  Filled 2018-10-09 (×2): qty 1

## 2018-10-09 MED ORDER — PHENYLEPHRINE 40 MCG/ML (10ML) SYRINGE FOR IV PUSH (FOR BLOOD PRESSURE SUPPORT)
PREFILLED_SYRINGE | INTRAVENOUS | Status: DC | PRN
  Administered 2018-10-09: 80 ug via INTRAVENOUS
  Administered 2018-10-09: 40 ug via INTRAVENOUS

## 2018-10-09 MED ORDER — ACETAMINOPHEN 650 MG RE SUPP: 650.0000 mg | RECTAL | Status: DC | PRN

## 2018-10-09 MED ORDER — ONDANSETRON HCL 4 MG/2ML IJ SOLN
INTRAMUSCULAR | Status: DC | PRN
  Administered 2018-10-09: 4 mg via INTRAVENOUS

## 2018-10-09 MED ORDER — HYDROCODONE-ACETAMINOPHEN 5-325 MG PO TABS
1.0000 | ORAL_TABLET | ORAL | Status: DC | PRN
  Administered 2018-10-09 – 2018-10-10 (×2): 1 via ORAL
  Filled 2018-10-09 (×2): qty 1

## 2018-10-09 MED ORDER — MENTHOL 3 MG MT LOZG
1.0000 | LOZENGE | OROMUCOSAL | Status: DC | PRN
  Filled 2018-10-09: qty 9

## 2018-10-09 MED ORDER — BUPIVACAINE HCL (PF) 0.25 % IJ SOLN
INTRAMUSCULAR | Status: AC
  Filled 2018-10-09: qty 30

## 2018-10-09 MED ORDER — ACETAMINOPHEN 10 MG/ML IV SOLN
INTRAVENOUS | Status: AC
  Filled 2018-10-09: qty 100

## 2018-10-09 MED ORDER — SODIUM CHLORIDE 0.9% FLUSH
3.0000 mL | Freq: Two times a day (BID) | INTRAVENOUS | Status: DC
  Administered 2018-10-09: 3 mL via INTRAVENOUS

## 2018-10-09 MED ORDER — ONDANSETRON HCL 4 MG/2ML IJ SOLN: 4.0000 mg | Freq: Four times a day (QID) | INTRAMUSCULAR | Status: DC | PRN

## 2018-10-09 MED ORDER — 0.9 % SODIUM CHLORIDE (POUR BTL) OPTIME
TOPICAL | Status: DC | PRN
  Administered 2018-10-09: 1000 mL

## 2018-10-09 MED ORDER — HYDROMORPHONE HCL 1 MG/ML IJ SOLN
0.2500 mg | INTRAMUSCULAR | Status: DC | PRN
  Administered 2018-10-09 (×4): 0.25 mg via INTRAVENOUS

## 2018-10-09 MED ORDER — PROPOFOL 10 MG/ML IV BOLUS
INTRAVENOUS | Status: DC | PRN
  Administered 2018-10-09: 120 mg via INTRAVENOUS

## 2018-10-09 SURGICAL SUPPLY — 55 items
BAG DECANTER FOR FLEXI CONT (MISCELLANEOUS) ×2 IMPLANT
BENZOIN TINCTURE PRP APPL 2/3 (GAUZE/BANDAGES/DRESSINGS) ×2 IMPLANT
BLADE CLIPPER SURG (BLADE) IMPLANT
BUR CUTTER 7.0 ROUND (BURR) ×2 IMPLANT
CANISTER SUCT 3000ML PPV (MISCELLANEOUS) ×2 IMPLANT
CARTRIDGE OIL MAESTRO DRILL (MISCELLANEOUS) ×1 IMPLANT
CLSR STERI-STRIP ANTIMIC 1/2X4 (GAUZE/BANDAGES/DRESSINGS) ×2 IMPLANT
COVER WAND RF STERILE (DRAPES) ×2 IMPLANT
DECANTER SPIKE VIAL GLASS SM (MISCELLANEOUS) ×2 IMPLANT
DERMABOND ADVANCED (GAUZE/BANDAGES/DRESSINGS) ×1
DERMABOND ADVANCED .7 DNX12 (GAUZE/BANDAGES/DRESSINGS) ×1 IMPLANT
DIFFUSER DRILL AIR PNEUMATIC (MISCELLANEOUS) ×2 IMPLANT
DRAPE HALF SHEET 40X57 (DRAPES) ×2 IMPLANT
DRAPE LAPAROTOMY 100X72X124 (DRAPES) ×2 IMPLANT
DRAPE MICROSCOPE LEICA (MISCELLANEOUS) ×2 IMPLANT
DRAPE SURG 17X23 STRL (DRAPES) ×4 IMPLANT
DRSG OPSITE POSTOP 4X6 (GAUZE/BANDAGES/DRESSINGS) ×2 IMPLANT
DURAPREP 26ML APPLICATOR (WOUND CARE) ×2 IMPLANT
ELECT REM PT RETURN 9FT ADLT (ELECTROSURGICAL) ×2
ELECTRODE REM PT RTRN 9FT ADLT (ELECTROSURGICAL) ×1 IMPLANT
GAUZE 4X4 16PLY RFD (DISPOSABLE) IMPLANT
GAUZE SPONGE 4X4 12PLY STRL (GAUZE/BANDAGES/DRESSINGS) IMPLANT
GLOVE BIO SURGEON STRL SZ 6.5 (GLOVE) ×2 IMPLANT
GLOVE BIOGEL PI IND STRL 6.5 (GLOVE) ×1 IMPLANT
GLOVE BIOGEL PI IND STRL 7.0 (GLOVE) ×1 IMPLANT
GLOVE BIOGEL PI IND STRL 7.5 (GLOVE) ×1 IMPLANT
GLOVE BIOGEL PI INDICATOR 6.5 (GLOVE) ×1
GLOVE BIOGEL PI INDICATOR 7.0 (GLOVE) ×1
GLOVE BIOGEL PI INDICATOR 7.5 (GLOVE) ×1
GLOVE ECLIPSE 9.0 STRL (GLOVE) ×2 IMPLANT
GLOVE EXAM NITRILE LRG STRL (GLOVE) IMPLANT
GLOVE EXAM NITRILE XL STR (GLOVE) IMPLANT
GLOVE EXAM NITRILE XS STR PU (GLOVE) IMPLANT
GLOVE SURG SS PI 7.5 STRL IVOR (GLOVE) ×8 IMPLANT
GOWN STRL REUS W/ TWL LRG LVL3 (GOWN DISPOSABLE) ×2 IMPLANT
GOWN STRL REUS W/ TWL XL LVL3 (GOWN DISPOSABLE) ×1 IMPLANT
GOWN STRL REUS W/TWL 2XL LVL3 (GOWN DISPOSABLE) IMPLANT
GOWN STRL REUS W/TWL LRG LVL3 (GOWN DISPOSABLE) ×2
GOWN STRL REUS W/TWL XL LVL3 (GOWN DISPOSABLE) ×1
KIT BASIN OR (CUSTOM PROCEDURE TRAY) ×2 IMPLANT
KIT TURNOVER KIT B (KITS) ×2 IMPLANT
NEEDLE HYPO 22GX1.5 SAFETY (NEEDLE) ×2 IMPLANT
NEEDLE SPNL 22GX3.5 QUINCKE BK (NEEDLE) ×2 IMPLANT
NS IRRIG 1000ML POUR BTL (IV SOLUTION) ×2 IMPLANT
OIL CARTRIDGE MAESTRO DRILL (MISCELLANEOUS) ×2
PACK LAMINECTOMY NEURO (CUSTOM PROCEDURE TRAY) ×2 IMPLANT
PAD ARMBOARD 7.5X6 YLW CONV (MISCELLANEOUS) ×6 IMPLANT
RUBBERBAND STERILE (MISCELLANEOUS) ×4 IMPLANT
SPONGE SURGIFOAM ABS GEL SZ50 (HEMOSTASIS) ×2 IMPLANT
STRIP CLOSURE SKIN 1/2X4 (GAUZE/BANDAGES/DRESSINGS) ×2 IMPLANT
SUT VIC AB 2-0 CT1 18 (SUTURE) ×2 IMPLANT
SUT VIC AB 3-0 SH 8-18 (SUTURE) ×2 IMPLANT
TOWEL GREEN STERILE (TOWEL DISPOSABLE) ×2 IMPLANT
TOWEL GREEN STERILE FF (TOWEL DISPOSABLE) ×2 IMPLANT
WATER STERILE IRR 1000ML POUR (IV SOLUTION) ×2 IMPLANT

## 2018-10-09 NOTE — Op Note (Signed)
Date of procedure: 10/09/2018  Date of dictation: Same  Service: Neurosurgery  Preoperative diagnosis: Right L5-S1 foraminal stenosis with radiculopathy  Postoperative diagnosis: Same  Procedure Name: Right L5-S1 decompressive laminotomy with right L5 and S1 decompressive foraminotomies  Surgeon:Abimael Zeiter A.Katey Barrie, M.D.  Asst. Surgeon: Doran Durand, NP  Anesthesia: General  Indication: 77 year old female with severe right lower extremity pain consistent with a right-sided L5 radiculopathy which is failed conservative management.  Work-up demonstrates evidence of marked foraminal stenosis with associated foraminal cyst and pannus of inflammatory tissue causing compression of the exiting right L5 nerve root.  Patient is failed conservative management presents now for decompressive foraminotomies hopes of improving her symptoms.  Operative note: After induction of anesthesia, patient position prone on the Wilson frame and probably padded.  Lumbar region prepped and draped sterilely.  Incision made overlying L5-S1.  Dissection performed on the right.  Retractor placed.  X-ray taken in the S1-S2 level was exposed.  Dissection was redirected one level cephalad.  Retractor was replaced.  Laminotomy was then performed using high-speed drill and Kerrison Rogers to remove the inferior aspect of the lamina of L5 the medial aspect the L5-S1 facet joint complex and the superior rim of the S1 lamina.  Ligament flavum elevated and resected.  Microscope brought in field using microdissection spinal canal.  Thecal sac and right S1 nerve root was identified.  Right S1 nerve root was gently mobilized and this allowed for better dissection of the pannus of material extending into the right L5 foramen.  This is dissected free in an inferior medial to superior lateral direction.  The proximal L5 nerve root was initially identified and decompression then was completed on the course the exiting L5 nerve root.  At this point a very  extensive foraminotomies were performed.  There was no evidence of injury to thecal sac or nerve roots.  There is no evidence of any residual compression.  Wound was then irrigated fanlike solution.  Gelfoam was placed topically for hemostasis.  Wounds and closed in layers with Vicryl sutures.  Steri-Strips and sterile dressing were applied.  No apparent complications.  Patient tolerated the procedure well and she returns to the recovery room postop.

## 2018-10-09 NOTE — Evaluation (Signed)
Physical Therapy Evaluation Patient Details Name: Emily Mcbride MRN: [id number] DOB: Apr 14, 1941 Today's Date: 10/09/2018   History of Present Illness  Pt is a 77 y/o female who presents s/p L5-S1 laminectomy on 10/09/18. PMH significant for HTN, fibromyalgia, OA, L4-L5 laminectomy in 2014, cardiac cath.   Clinical Impression  Pt admitted with above diagnosis. Pt currently with functional limitations due to the deficits listed below (see PT Problem List). At the time of PT eval pt was able to perform transfers and ambulation with gross min guard assist to min assist with RW. Min assist required for stair training as well. Husband present throughout session and was actively participating in education. Pt will benefit from skilled PT to increase their independence and safety with mobility to allow discharge to the venue listed below.       Follow Up Recommendations No PT follow up;Supervision/Assistance - 24 hour(initially)    Equipment Recommendations  None recommended by PT    Recommendations for Other Services       Precautions / Restrictions Precautions Precautions: Back;Fall Precaution Booklet Issued: Yes (comment) Precaution Comments: Reviewed precautions with pt/family. Pt reports >20 falls in recent years.  Restrictions Weight Bearing Restrictions: No      Mobility  Bed Mobility Overal bed mobility: Needs Assistance Bed Mobility: Rolling;Sidelying to Sit;Sit to Sidelying Rolling: Supervision Sidelying to sit: Min guard     Sit to sidelying: Min assist General bed mobility comments: Assist to elevate LE's up into bed at end of session. VC's for step-by-step log roll technique.   Transfers Overall transfer level: Needs assistance Equipment used: Rolling walker (2 wheeled) Transfers: Sit to/from Stand Sit to Stand: Min guard         General transfer comment: Close guard for safety as pt powered up to full stand. Pt was cued for hand placement on seated surface  for safety.   Ambulation/Gait Ambulation/Gait assistance: Min guard Gait Distance (Feet): 150 Feet Assistive device: Rolling walker (2 wheeled) Gait Pattern/deviations: Step-through pattern;Decreased stride length;Trunk flexed Gait velocity: Decreased Gait velocity interpretation: <1.31 ft/sec, indicative of household ambulator General Gait Details: Slow but generally steady. No knee buckling noted. Pt talking almost non-stop throughout gait training and she was short of breath by the time we returned to her room. Pt was cued to stop talking and focus on her breathing during, but she would not make corrective changes.   Stairs Stairs: Yes Stairs assistance: Min assist Stair Management: One rail Right;Step to pattern;Forwards Number of Stairs: 2 General stair comments: HHA provided on the L and min assist for balance support as pt powered up to next step.   Wheelchair Mobility    Modified Rankin (Stroke Patients Only)       Balance Overall balance assessment: Needs assistance Sitting-balance support: Feet supported;No upper extremity supported Sitting balance-Leahy Scale: Fair     Standing balance support: Single extremity supported;During functional activity Standing balance-Leahy Scale: Fair Standing balance comment: Statically. For dynamic activity required BUE support                             Pertinent Vitals/Pain Pain Assessment: 0-10 Pain Score: 5  Pain Location: Incision site Pain Descriptors / Indicators: Operative site guarding;Burning Pain Intervention(s): Limited activity within patient's tolerance;Monitored during session;Repositioned    Home Living Family/patient expects to be discharged to:: Private residence Living Arrangements: Spouse/significant other Available Help at Discharge: Family;Available 24 hours/day Type of Home: House Home Access: Stairs to  enter Entrance Stairs-Rails: None Entrance Stairs-Number of Steps: 2 Home Layout: Two  level Home Equipment: Emergency planning/management officer - 2 wheels;Walker - 4 wheels      Prior Function Level of Independence: Needs assistance   Gait / Transfers Assistance Needed: Using rollator all the time prior to surgery  ADL's / Homemaking Assistance Needed: Requires assist to wash her back. Pt states she was getting in/out of the tub by herself however she has had falls while attempting to do so.         Hand Dominance        Extremity/Trunk Assessment   Upper Extremity Assessment Upper Extremity Assessment: Overall WFL for tasks assessed    Lower Extremity Assessment Lower Extremity Assessment: Generalized weakness    Cervical / Trunk Assessment Cervical / Trunk Assessment: Other exceptions Cervical / Trunk Exceptions: s/p surgery  Communication      Cognition Arousal/Alertness: Awake/alert Behavior During Therapy: WFL for tasks assessed/performed Overall Cognitive Status: Within Functional Limits for tasks assessed                                        General Comments      Exercises     Assessment/Plan    PT Assessment Patient needs continued PT services  PT Problem List Decreased strength;Decreased range of motion;Decreased activity tolerance;Decreased balance;Decreased mobility;Decreased knowledge of use of DME;Decreased safety awareness;Decreased knowledge of precautions;Pain       PT Treatment Interventions DME instruction;Gait training;Stair training;Functional mobility training;Therapeutic activities;Therapeutic exercise;Neuromuscular re-education;Patient/family education    PT Goals (Current goals can be found in the Care Plan section)  Acute Rehab PT Goals Patient Stated Goal: Not to fall anymore PT Goal Formulation: With patient/family Time For Goal Achievement: 10/16/18 Potential to Achieve Goals: Good    Frequency Min 5X/week   Barriers to discharge        Co-evaluation               AM-PAC PT "6 Clicks" Daily  Activity  Outcome Measure Difficulty turning over in bed (including adjusting bedclothes, sheets and blankets)?: A Little Difficulty moving from lying on back to sitting on the side of the bed? : A Little Difficulty sitting down on and standing up from a chair with arms (e.g., wheelchair, bedside commode, etc,.)?: A Little Help needed moving to and from a bed to chair (including a wheelchair)?: A Little Help needed walking in hospital room?: A Little Help needed climbing 3-5 steps with a railing? : A Little 6 Click Score: 18    End of Session Equipment Utilized During Treatment: Gait belt Activity Tolerance: Patient tolerated treatment well Patient left: in bed;with call bell/phone within reach;with family/visitor present;with SCD's reapplied Nurse Communication: Mobility status PT Visit Diagnosis: Unsteadiness on feet (R26.81);Pain;Difficulty in walking, not elsewhere classified (R26.2) Pain - part of body: (back)    Time: 1329-1406 PT Time Calculation (min) (ACUTE ONLY): 37 min   Charges:   PT Evaluation $PT Eval Moderate Complexity: 1 Mod PT Treatments $Gait Training: 8-22 mins        Conni Slipper, PT, DPT Acute Rehabilitation Services Pager: [phone] Office: [phone]   [name] 10/09/2018, 2:53 PM

## 2018-10-09 NOTE — Brief Op Note (Signed)
10/09/2018  9:34 AM  PATIENT:  Briant Cedar  77 y.o. female  PRE-OPERATIVE DIAGNOSIS:  RADICULOPATHY, LUMBAR REGION  POST-OPERATIVE DIAGNOSIS:  RADICULOPATHY, LUMBAR REGION  PROCEDURE:  Procedure(s) with comments: LAMINECTOMY AND FORAMINOTOMY LUMBAR FIVE- SACRAL ONE (Right) - LAMINECTOMY AND FORAMINOTOMY LUMBAR FIVE- SACRAL ONE  SURGEON:  Surgeon(s) and Role:    * Julio Sicks, MD - Primary  PHYSICIAN ASSISTANT:   ASSISTANTSDoran Durand, NP   ANESTHESIA:   general  EBL:  100 mL   BLOOD ADMINISTERED:none  DRAINS: none   LOCAL MEDICATIONS USED:  MARCAINE     SPECIMEN:  No Specimen  DISPOSITION OF SPECIMEN:  N/A  COUNTS:  YES  TOURNIQUET:  * No tourniquets in log *  DICTATION: .Dragon Dictation  PLAN OF CARE: Admit for overnight observation  PATIENT DISPOSITION:  PACU - hemodynamically stable.   Delay start of Pharmacological VTE agent (>24hrs) due to surgical blood loss or risk of bleeding: yes

## 2018-10-09 NOTE — Progress Notes (Signed)
Patient did not take beta blocker this morning.  Patient's HR 55-60.  Holding home dose of beta blocker.  Will notifiy Dr. Hyacinth Meeker.

## 2018-10-09 NOTE — Transfer of Care (Signed)
Immediate Anesthesia Transfer of Care Note  Patient: Emily Mcbride  Procedure(s) Performed: LAMINECTOMY AND FORAMINOTOMY LUMBAR FIVE- SACRAL ONE (Right Back)  Patient Location: PACU  Anesthesia Type:General  Level of Consciousness: awake and patient cooperative  Airway & Oxygen Therapy: Patient Spontanous Breathing  Post-op Assessment: Report given to RN and Post -op Vital signs reviewed and stable  Post vital signs: Reviewed and stable  Last Vitals:  Vitals Value Taken Time  BP 165/62 10/09/2018  9:47 AM  Temp    Pulse 78 10/09/2018  9:47 AM  Resp 18 10/09/2018  9:47 AM  SpO2 100 % 10/09/2018  9:47 AM  Vitals shown include unvalidated device data.  Last Pain:  Vitals:   10/09/18 0722  TempSrc:   PainSc: 8       Patients Stated Pain Goal: 3 (10/09/18 1610)  Complications: No apparent anesthesia complications

## 2018-10-09 NOTE — H&P (Signed)
Emily Mcbride is an 77 y.o. female.   Chief Complaint: Leg pain HPI: 77 year old female with chronic and progressive right lower extremity pain failing conservative management.  Work-up demonstrates evidence of spondylosis with proximal foraminal stenosis on the right at L5-S1.  Patient presents now for right-sided L5-S1 laminotomy and foraminotomy in hopes of improving her symptoms.  Past Medical History:  Diagnosis Date  . Anemia    as a child  . Arthritis    osteoarthritis  . Chorioretinitis   . Colitis   . Fibromyalgia   . GERD (gastroesophageal reflux disease)    no longer takes meds   . Headache(784.0)    due to a fall several mos.  . Hypertension   . Kidney stones   . Rheumatic fever    as a child  . Skipped heart beats    due to rheumatic fever as a child  . Sleep apnea    does not use CPAP due discomfort    Past Surgical History:  Procedure Laterality Date  . CARDIAC CATHETERIZATION    . COLONOSCOPY    . DILATION AND CURETTAGE OF UTERUS    . LUMBAR LAMINECTOMY/DECOMPRESSION MICRODISCECTOMY Right 11/16/2013   Procedure: LUMBAR LAMINECTOMY/Laminotomy/DECOMPRESSION MICRODISCECTOMY RIGHT LUMBAR FOUR-FIVE;  Surgeon: Temple Pacini, MD;  Location: [facility];  Service: Neurosurgery;  Laterality: Right;  LUMBAR LAMINECTOMY/Laminotomy/DECOMPRESSION MICRODISCECTOMY RIGHT LUMBAR FOUR-FIVE  . TONSILLECTOMY      History reviewed. No pertinent family history. Social History:  reports that she has never smoked. She has never used smokeless tobacco. She reports that she does not drink alcohol or use drugs.  Allergies:  Allergies  Allergen Reactions  . Amaranth (Fd&C Red #2) Shortness Of Breath  . Sulfa Antibiotics Hives, Rash and Other (See Comments)    Blisters     Medications Prior to Admission  Medication Sig Dispense Refill  . acetaminophen (TYLENOL) 650 MG CR tablet Take 1,300 mg by mouth 2 (two) times daily.    Marland Kitchen aspirin EC 81 MG tablet Take 81 mg by mouth  daily.    . calcium carbonate (OSCAL) 1500 (600 Ca) MG TABS tablet Take 600 mg of elemental calcium by mouth daily with breakfast.    . meloxicam (MOBIC) 15 MG tablet Take 15 mg by mouth daily.    . Menthol, Topical Analgesic, (BLUE-EMU MAXIMUM STRENGTH EX) Apply 1 application topically daily as needed (back pain).    . Multiple Vitamin (MULTIVITAMIN WITH MINERALS) TABS tablet Take 1 tablet by mouth daily.    . nebivolol (BYSTOLIC) 10 MG tablet Take 10 mg by mouth daily.    . pregabalin (LYRICA) 75 MG capsule Take 75 mg by mouth daily.     . sertraline (ZOLOFT) 50 MG tablet Take 50 mg by mouth daily.    . cyclobenzaprine (FLEXERIL) 10 MG tablet Take 1 tablet (10 mg total) by mouth 3 (three) times daily as needed for muscle spasms. (Patient not taking: Reported on 09/29/2018) 30 tablet 0  . HYDROcodone-acetaminophen (NORCO/VICODIN) 5-325 MG per tablet Take 1-2 tablets by mouth every 4 (four) hours as needed for moderate pain. (Patient not taking: Reported on 09/29/2018) 60 tablet 0    No results found for this or any previous visit (from the past 48 hour(s)). No results found.  Pertinent items noted in HPI and remainder of comprehensive ROS otherwise negative.  Blood pressure (!) 158/71, pulse 62, temperature 98.7 F (37.1 C), temperature source Oral, resp. rate 20, height 5\' 2"  (1.575 m), weight 98.4 kg,  SpO2 99 %.  Patient is awake and alert.  She is oriented and appropriate.  Cranial nerve function is intact.  Speech is fluent.  Judgment and insight are intact.  Motor examination normal bilaterally except for some mild weakness of dorsiflexion on the right.  Sensory examination normal bilaterally except for some mild decreased sensation to pinprick and light touch in her right L5 dermatome.  Reflexes are hypoactive but symmetric.  No evidence of long track signs.  Gait is antalgic.  Posture is somewhat flexed.  Examination head ears eyes no throats unremarkable her chest and abdomen are benign.   Extremities are free from injury deformity. Assessment/Plan Right L5-S1 spondylosis with foraminal stenosis and radiculopathy.  Plan right L5-S1 decompressive laminotomy foraminotomies.  Risks and benefits been explained.  Patient wishes to proceed.  Sherilyn Cooter A Trevell Pariseau 10/09/2018, 7:48 AM

## 2018-10-09 NOTE — Anesthesia Preprocedure Evaluation (Signed)
Anesthesia Evaluation  Patient identified by MRN, date of birth, ID band Patient awake    Reviewed: Allergy & Precautions, H&P , Patient's Chart, lab work & pertinent test results, reviewed documented beta blocker date and time   History of Anesthesia Complications Negative for: history of anesthetic complications  Airway Mallampati: II       Dental  (+) Poor Dentition, Missing, Chipped, Dental Advisory Given   Pulmonary sleep apnea (does not use CPAP) ,    Pulmonary exam normal breath sounds clear to auscultation       Cardiovascular hypertension, Pt. on medications and Pt. on home beta blockers Normal cardiovascular exam Rhythm:Regular Rate:Normal  '05 cath: normal coronaries, normal LV   Neuro/Psych  Headaches,    GI/Hepatic Neg liver ROS, GERD  Medicated and Controlled,  Endo/Other  Morbid obesity  Renal/GU negative Renal ROS     Musculoskeletal  (+) Arthritis , Osteoarthritis,  Fibromyalgia -  Abdominal (+) + obese,   Peds  Hematology negative hematology ROS (+)   Anesthesia Other Findings   Reproductive/Obstetrics                             Anesthesia Physical  Anesthesia Plan  ASA: III  Anesthesia Plan: General   Post-op Pain Management:    Induction: Intravenous  PONV Risk Score and Plan: 3 and Ondansetron, Dexamethasone and Midazolam  Airway Management Planned: Oral ETT  Additional Equipment:   Intra-op Plan:   Post-operative Plan: Extubation in OR  Informed Consent: I have reviewed the patients History and Physical, chart, labs and discussed the procedure including the risks, benefits and alternatives for the proposed anesthesia with the patient or authorized representative who has indicated his/her understanding and acceptance.   Dental advisory given  Plan Discussed with: CRNA and Surgeon  Anesthesia Plan Comments: (Plan routine monitors, GETA)         Anesthesia Quick Evaluation

## 2018-10-09 NOTE — Evaluation (Signed)
Occupational Therapy Evaluation Patient Details Name: Emily Mcbride MRN: [id number] DOB: 17-May-1941 Today's Date: 10/09/2018    History of Present Illness Pt is a 77 y/o female who presents s/p L5-S1 laminectomy on 10/09/18. PMH significant for HTN, fibromyalgia, OA, L4-L5 laminectomy in 2014, cardiac cath.    Clinical Impression   PTA patient reports independent with ADLs, limited IADLs. Pt admitted for above and limited by impaired balance, decreased activity tolerance, adherence to precautions and pain.  Patient demonstrates ability to complete grooming with min guard, UB ADL with setup, LB ADL with min to mod assist, toileting with min guard, toilet transfers with min guard, and bed mobility with min assist. Educated on precautions, safety, ADL compensatory techniques, fall prevention, and recommendations. Patient will have support of family at discharge.  Patient will benefit from continued OT services while admitted, but anticipate no further needs after dc.  Will continue to follow.      Follow Up Recommendations  No OT follow up;Supervision/Assistance - 24 hour    Equipment Recommendations  None recommended by OT    Recommendations for Other Services       Precautions / Restrictions Precautions Precautions: Back;Fall Precaution Booklet Issued: Yes (comment) Precaution Comments: Reviewed precautions with pt/family. Pt reports >20 falls in recent years.  Restrictions Weight Bearing Restrictions: No      Mobility Bed Mobility Overal bed mobility: Needs Assistance Bed Mobility: Rolling;Sidelying to Sit;Sit to Sidelying Rolling: Supervision Sidelying to sit: Min guard     Sit to sidelying: Min guard General bed mobility comments: Assist to elevate LE's up into bed at end of session. VC's for step-by-step log roll technique.   Transfers Overall transfer level: Needs assistance Equipment used: Rolling walker (2 wheeled) Transfers: Sit to/from Stand Sit to Stand: Min  guard         General transfer comment: Close guard for safety as pt powered up to full stand. Pt was cued for hand placement on seated surface for safety.     Balance Overall balance assessment: Needs assistance Sitting-balance support: Feet supported;No upper extremity supported Sitting balance-Leahy Scale: Fair     Standing balance support: Single extremity supported;During functional activity Standing balance-Leahy Scale: Fair Standing balance comment: Statically. For dynamic activity required BUE support                           ADL either performed or assessed with clinical judgement   ADL Overall ADL's : Needs assistance/impaired     Grooming: Min guard;Standing   Upper Body Bathing: Set up;Sitting   Lower Body Bathing: Minimal assistance;Sit to/from stand;Cueing for back precautions;Cueing for compensatory techniques Lower Body Bathing Details (indicate cue type and reason): reviewed compensatory techniques and AE for LB bathing seated, unable to complete figure 4 technique but has long handed sponge available  Upper Body Dressing : Supervision/safety;Sitting   Lower Body Dressing: Moderate assistance;Sit to/from stand;Cueing for compensatory techniques;Cueing for back precautions Lower Body Dressing Details (indicate cue type and reason): educated on modified techniques and AE for LB dressing as unable to complete figure 4 technique, would benefit from further AE education Toilet Transfer: Min guard;Ambulation;RW;Grab bars Toilet Transfer Details (indicate cue type and reason): cueing for hand placement and safety, using RW  Toileting- Clothing Manipulation and Hygiene: Min guard;Sit to/from stand Toileting - Clothing Manipulation Details (indicate cue type and reason): educated on compensatory techniques to adhere to precautions   Tub/Shower Transfer Details (indicate cue type and reason): verbally  educated and demosntrated technique using RW completing  backwards step over threshold Functional mobility during ADLs: Min guard;Rolling walker General ADL Comments: cueing for posture and safety, cueing throughout session to adhere to spinal precautions; reviewed compensatory techniques      Vision Patient Visual Report: No change from baseline Vision Assessment?: No apparent visual deficits     Perception     Praxis      Pertinent Vitals/Pain Pain Assessment: 0-10 Pain Score: 5  Pain Location: Incision site Pain Descriptors / Indicators: Operative site guarding Pain Intervention(s): Limited activity within patient's tolerance;Repositioned     Hand Dominance     Extremity/Trunk Assessment Upper Extremity Assessment Upper Extremity Assessment: Overall WFL for tasks assessed   Lower Extremity Assessment Lower Extremity Assessment: Defer to PT evaluation   Cervical / Trunk Assessment Cervical / Trunk Assessment: Other exceptions Cervical / Trunk Exceptions: s/p surgery   Communication Communication Communication: No difficulties   Cognition Arousal/Alertness: Awake/alert Behavior During Therapy: WFL for tasks assessed/performed Overall Cognitive Status: Within Functional Limits for tasks assessed                                     General Comments  family present and supportive    Exercises     Shoulder Instructions      Home Living Family/patient expects to be discharged to:: Private residence Living Arrangements: Spouse/significant other Available Help at Discharge: Family;Available 24 hours/day Type of Home: House Home Access: Stairs to enter [employer] of Steps: 2 Entrance Stairs-Rails: None Home Layout: Two level Alternate Level Stairs-Number of Steps: Consulting civil engineer Shower/Tub: Chief Strategy Officer: Standard     Home Equipment: Emergency planning/management officer - 2 wheels;Walker - 4 wheels;Adaptive equipment Adaptive Equipment: Reacher;Long-handled sponge;Sock aid         Prior Functioning/Environment Level of Independence: Needs assistance  Gait / Transfers Assistance Needed: Using rollator all the time prior to surgery ADL's / Homemaking Assistance Needed: reports independent with ADLs, limited IADLs            OT Problem List: Decreased strength;Decreased activity tolerance;Impaired balance (sitting and/or standing);Decreased safety awareness;Decreased knowledge of use of DME or AE;Decreased knowledge of precautions;Pain      OT Treatment/Interventions: Self-care/ADL training;Therapeutic exercise;DME and/or AE instruction;Energy conservation;Therapeutic activities;Patient/family education;Balance training    OT Goals(Current goals can be found in the care plan section) Acute Rehab OT Goals Patient Stated Goal: Not to fall anymore OT Goal Formulation: With patient Time For Goal Achievement: 10/23/18 Potential to Achieve Goals: Good  OT Frequency: Min 2X/week   Barriers to D/C:            Co-evaluation              AM-PAC PT "6 Clicks" Daily Activity     Outcome Measure Help from another person eating meals?: None Help from another person taking care of personal grooming?: A Little Help from another person toileting, which includes using toliet, bedpan, or urinal?: A Little Help from another person bathing (including washing, rinsing, drying)?: A Little Help from another person to put on and taking off regular upper body clothing?: None Help from another person to put on and taking off regular lower body clothing?: A Lot 6 Click Score: 19   End of Session Equipment Utilized During Treatment: Gait belt;Rolling walker Nurse Communication: Mobility status  Activity Tolerance: Patient tolerated treatment well Patient left: in bed;with  call bell/phone within reach;with SCD's reapplied;with family/visitor present  OT Visit Diagnosis: Unsteadiness on feet (R26.81);History of falling (Z91.81);Pain Pain - part of body: (back )                 Time: 1341-1418 OT Time Calculation (min): 37 min Charges:  OT General Charges $OT Visit: 1 Visit OT Evaluation $OT Eval Low Complexity: 1 Low OT Treatments $Self Care/Home Management : 8-22 mins  Chancy Milroy, OT Acute Rehabilitation Services Pager [phone] Office [phone]   Chancy Milroy 10/09/2018, 4:56 PM

## 2018-10-09 NOTE — Anesthesia Postprocedure Evaluation (Signed)
Anesthesia Post Note  Patient: Emily Mcbride  Procedure(s) Performed: LAMINECTOMY AND FORAMINOTOMY LUMBAR FIVE- SACRAL ONE (Right Back)     Patient location during evaluation: PACU Anesthesia Type: General Level of consciousness: awake and alert Pain management: pain level controlled Vital Signs Assessment: post-procedure vital signs reviewed and stable Respiratory status: spontaneous breathing, nonlabored ventilation and respiratory function stable Cardiovascular status: blood pressure returned to baseline and stable Postop Assessment: no apparent nausea or vomiting Anesthetic complications: no    Last Vitals:  Vitals:   10/09/18 1102 10/09/18 1105  BP: (!) 113/50   Pulse: (!) 59   Resp: 13   Temp:  (!) 36.3 C  SpO2: 100%     Last Pain:  Vitals:   10/09/18 1015  TempSrc:   PainSc: 9                  Lowella Curb

## 2018-10-09 NOTE — Anesthesia Procedure Notes (Signed)
Procedure Name: Intubation Performed by: Milford Cage, CRNA Pre-anesthesia Checklist: Patient identified, Emergency Drugs available, Suction available and Patient being monitored Patient Re-evaluated:Patient Re-evaluated prior to induction Oxygen Delivery Method: Circle System Utilized Preoxygenation: Pre-oxygenation with 100% oxygen Induction Type: IV induction Ventilation: Mask ventilation without difficulty Laryngoscope Size: Mac and 3 Grade View: Grade III Tube type: Oral Tube size: 7.0 mm Number of attempts: 1 Airway Equipment and Method: Stylet Placement Confirmation: ETT inserted through vocal cords under direct vision,  positive ETCO2 and breath sounds checked- equal and bilateral Secured at: 22 cm Tube secured with: Tape Dental Injury: Teeth and Oropharynx as per pre-operative assessment

## 2018-10-10 ENCOUNTER — Encounter (HOSPITAL_COMMUNITY): Payer: Self-pay | Admitting: Neurosurgery

## 2018-10-10 DIAGNOSIS — M4807 Spinal stenosis, lumbosacral region: Secondary | ICD-10-CM | POA: Diagnosis not present

## 2018-10-10 MED ORDER — HYDROCODONE-ACETAMINOPHEN 5-325 MG PO TABS: 1.0000 | ORAL_TABLET | ORAL | 0 refills | Status: DC | PRN

## 2018-10-10 MED ORDER — CYCLOBENZAPRINE HCL 10 MG PO TABS: 10.0000 mg | ORAL_TABLET | Freq: Three times a day (TID) | ORAL | 1 refills | Status: DC | PRN

## 2018-10-10 MED ORDER — HYDROCODONE-ACETAMINOPHEN 5-325 MG PO TABS: 2.0000 | ORAL_TABLET | ORAL | Status: DC | PRN

## 2018-10-10 NOTE — Progress Notes (Signed)
Occupational Therapy Treatment Patient Details Name: Emily Mcbride MRN: [id number] DOB: Sep 11, 1941 Today's Date: 10/10/2018    History of present illness Pt is a 77 y/o female who presents s/p L5-S1 laminectomy on 10/09/18. PMH significant for HTN, fibromyalgia, OA, L4-L5 laminectomy in 2014, cardiac cath.    OT comments  Pt. Eager for participation in skilled OT.  Able to complete bed mobility, LB dressing, toileting tasks, and tub transfer.    Follow Up Recommendations  No OT follow up;Supervision/Assistance - 24 hour    Equipment Recommendations  None recommended by OT    Recommendations for Other Services      Precautions / Restrictions Precautions Precautions: Back;Fall Precaution Comments: Reviewed precautions with pt       Mobility Bed Mobility Overal bed mobility: Needs Assistance Bed Mobility: Rolling;Sidelying to Sit Rolling: Supervision Sidelying to sit: Supervision     Sit to sidelying: Supervision General bed mobility comments: hob flat, no rails, exits on L side  Transfers Overall transfer level: Needs assistance Equipment used: Rolling walker (2 wheeled) Transfers: Sit to/from [employer] Sit to Stand: Supervision Stand pivot transfers: Supervision       General transfer comment: intermittent verbal cues for hand placement sit/stand, stand/sit    Balance                                           ADL either performed or assessed with clinical judgement   ADL Overall ADL's : Needs assistance/impaired     Grooming: Wash/dry hands;Cueing for safety;Standing;Supervision/safety Grooming Details (indicate cue type and reason): cues for RW placement at counter, pt. initially pushed rw to the side vs. keeping it in front of her       Lower Body Bathing Details (indicate cue type and reason): pt. able to cross legs over today while seated, also reports having shower chair already in her tub at home     Lower  Body Dressing: Supervision/safety;Sitting/lateral leans;Cueing for compensatory techniques Lower Body Dressing Details (indicate cue type and reason): able to cross each leg over knee to reach feet for simulated dressing tasks, reports less pain than previous day Toilet Transfer: Supervision/safety;RW;Ambulation;Regular Social worker and Hygiene: Min guard;Sit to/from stand Toileting - Clothing Manipulation Details (indicate cue type and reason): educated on compensatory techniques to adhere to precautions (ablet to perform all aspects of care without breaking precautions) Tub/ Shower Transfer: Tub transfer;Min guard;Ambulation;Rolling walker;Shower seat;Grab Investment banker, operational Details (indicate cue type and reason): able to side step over simulated tub ledge. reports having shower chair already in the tub and a grab bar in the tub also Functional mobility during ADLs: Min guard;Rolling walker       Vision       Perception     Praxis      Cognition Arousal/Alertness: Awake/alert Behavior During Therapy: WFL for tasks assessed/performed Overall Cognitive Status: Within Functional Limits for tasks assessed                                          Exercises     Shoulder Instructions       General Comments      Pertinent Vitals/ Pain       Pain Assessment: No/denies pain  Home Living  Prior Functioning/Environment              Frequency  Min 2X/week        Progress Toward Goals  OT Goals(current goals can now be found in the care plan section)  Progress towards OT goals: Progressing toward goals     Plan      Co-evaluation                 AM-PAC PT "6 Clicks" Daily Activity     Outcome Measure   Help from another person eating meals?: None Help from another person taking care of personal grooming?: A Little Help from another person  toileting, which includes using toliet, bedpan, or urinal?: A Little Help from another person bathing (including washing, rinsing, drying)?: A Little Help from another person to put on and taking off regular upper body clothing?: None Help from another person to put on and taking off regular lower body clothing?: A Lot 6 Click Score: 19    End of Session Equipment Utilized During Treatment: Rolling walker  OT Visit Diagnosis: Unsteadiness on feet (R26.81);History of falling (Z91.81);Pain   Activity Tolerance Patient tolerated treatment well   Patient Left in chair;with call bell/phone within reach   Nurse Communication          Time: 1610-9604 OT Time Calculation (min): 20 min  Charges: OT General Charges $OT Visit: 1 Visit OT Treatments $Self Care/Home Management : 8-22 mins   Robet Leu, COTA/L 10/10/2018, 9:20 AM

## 2018-10-10 NOTE — Progress Notes (Signed)
Patient alert and oriented, mae's well, voiding adequate amount of urine, swallowing without difficulty, no c/o pain at time of discharge. Patient discharged home with family. Script and discharged instructions given to patient. Patient and family stated understanding of instructions given. Patient has an appointment with Dr. Pool  

## 2018-10-10 NOTE — Discharge Instructions (Signed)

## 2018-10-10 NOTE — Progress Notes (Signed)
Physical Therapy Treatment Patient Details Name: Emily Mcbride MRN: [id number] DOB: 1941-01-27 Today's Date: 10/10/2018    History of Present Illness Pt is a 77 y/o female who presents s/p L5-S1 laminectomy on 10/09/18. PMH significant for HTN, fibromyalgia, OA, L4-L5 laminectomy in 2014, cardiac cath.     PT Comments    Pt progressing well with post-op mobility.  Pt was educated on car transfer, precautions, energy conservation, and ways to decrease risk for falls at home. Feel pt could benefit from outpatient therapy when MD feels is appropriate to improve balance and further address high number of falls pt has had. Will continue to follow nd progress as able per POC.   Follow Up Recommendations  Outpatient PT (when appropriate per post-op protocol);Supervision/Assistance - 24 hour(initially)     Equipment Recommendations  None recommended by PT    Recommendations for Other Services       Precautions / Restrictions Precautions Precautions: Back;Fall Precaution Booklet Issued: Yes (comment) Precaution Comments: Reviewed precautions with pt Restrictions Weight Bearing Restrictions: No    Mobility  Bed Mobility Overal bed mobility: Needs Assistance Bed Mobility: Rolling;Sidelying to Sit Rolling: Supervision Sidelying to sit: Supervision     Sit to sidelying: Min guard General bed mobility comments: Min guard assist during LE elevation back onto bed at end of session. Pt entering bed on L side to simulate home environment.   Transfers Overall transfer level: Needs assistance Equipment used: Rolling walker (2 wheeled) Transfers: Sit to/from [employer] Sit to Stand: Supervision Stand pivot transfers: Supervision       General transfer comment: VC's for hand placement on seated surface for safety.   Ambulation/Gait Ambulation/Gait assistance: Min guard Gait Distance (Feet): 275 Feet Assistive device: Rolling walker (2 wheeled) Gait  Pattern/deviations: Step-through pattern;Decreased stride length;Trunk flexed Gait velocity: Decreased Gait velocity interpretation: 1.31 - 2.62 ft/sec, indicative of limited community ambulator General Gait Details: Slow but generally steady. No knee buckling noted. Pt was able to maintain improved posture throughout gait training.    Stairs             Wheelchair Mobility    Modified Rankin (Stroke Patients Only)       Balance Overall balance assessment: Needs assistance Sitting-balance support: Feet supported;No upper extremity supported Sitting balance-Leahy Scale: Fair     Standing balance support: Single extremity supported;During functional activity Standing balance-Leahy Scale: Fair Standing balance comment: Statically. For dynamic activity required BUE support                            Cognition Arousal/Alertness: Awake/alert Behavior During Therapy: WFL for tasks assessed/performed Overall Cognitive Status: Within Functional Limits for tasks assessed                                        Exercises      General Comments        Pertinent Vitals/Pain Pain Assessment: Faces Faces Pain Scale: Hurts a little bit Pain Location: Incision site Pain Descriptors / Indicators: Operative site guarding Pain Intervention(s): Monitored during session    Home Living                      Prior Function            PT Goals (current goals can now be found in the care plan  section) Acute Rehab PT Goals Patient Stated Goal: Not to fall anymore PT Goal Formulation: With patient/family Time For Goal Achievement: 10/16/18 Potential to Achieve Goals: Good Progress towards PT goals: Progressing toward goals    Frequency    Min 5X/week      PT Plan Current plan remains appropriate    Co-evaluation              AM-PAC PT "6 Clicks" Daily Activity  Outcome Measure  Difficulty turning over in bed (including  adjusting bedclothes, sheets and blankets)?: A Little Difficulty moving from lying on back to sitting on the side of the bed? : A Little Difficulty sitting down on and standing up from a chair with arms (e.g., wheelchair, bedside commode, etc,.)?: A Little Help needed moving to and from a bed to chair (including a wheelchair)?: A Little Help needed walking in hospital room?: A Little Help needed climbing 3-5 steps with a railing? : A Little 6 Click Score: 18    End of Session Equipment Utilized During Treatment: Gait belt Activity Tolerance: Patient tolerated treatment well Patient left: in bed;with call bell/phone within reach;with family/visitor present;with SCD's reapplied Nurse Communication: Mobility status PT Visit Diagnosis: Unsteadiness on feet (R26.81);Pain;Difficulty in walking, not elsewhere classified (R26.2) Pain - part of body: (back)     Time: 8119-1478 PT Time Calculation (min) (ACUTE ONLY): 19 min  Charges:  $Gait Training: 8-22 mins                     Conni Slipper, PT, DPT Acute Rehabilitation Services Pager: [phone] Office: [phone]    [name] 10/10/2018, 10:28 AM

## 2018-10-10 NOTE — Discharge Summary (Signed)
Physician Discharge Summary  Patient ID: Emily Mcbride MRN: [id number] DOB/AGE: 06/01/1941 77 y.o.  Admit date: 10/09/2018 Discharge date: 10/10/2018  Admission Diagnoses:  Discharge Diagnoses:  Active Problems:   Lumbar foraminal stenosis   Discharged Condition: good  Hospital Course: Patient admitted to the hospital where she underwent uncomplicated lumbar decompressive surgery on the right at L5-S1.  Postop which she is doing reasonably well.  Preoperative radicular pain seems improved.  She is standing and walking better.  She is having no new symptoms of numbness or weakness.  He is ready for discharge home.  Consults:   Significant Diagnostic Studies:   Treatments:   Discharge Exam: Blood pressure 110/63, pulse (!) 59, temperature 98.5 F (36.9 C), temperature source Oral, resp. rate 16, height 5\' 2"  (1.575 m), weight 98.4 kg, SpO2 100 %. Awake and alert.  Oriented and appropriate.  Cranial nerve function intact.  Motor and sensory function extremities stable.  Wound clean and dry.  Chest and abdomen benign.  Disposition: Discharge disposition: 01-Home or Self Care        Allergies as of 10/10/2018      Reactions   Amaranth (fd&c Red #2) Shortness Of Breath   Sulfa Antibiotics Hives, Rash, Other (See Comments)   Blisters       Medication List    TAKE these medications   acetaminophen 650 MG CR tablet Commonly known as:  TYLENOL Take 1,300 mg by mouth 2 (two) times daily.   aspirin EC 81 MG tablet Take 81 mg by mouth daily.   BLUE-EMU MAXIMUM STRENGTH EX Apply 1 application topically daily as needed (back pain).   calcium carbonate 1500 (600 Ca) MG Tabs tablet Commonly known as:  OSCAL Take 600 mg of elemental calcium by mouth daily with breakfast.   cyclobenzaprine 10 MG tablet Commonly known as:  FLEXERIL Take 1 tablet (10 mg total) by mouth 3 (three) times daily as needed for muscle spasms.   HYDROcodone-acetaminophen 5-325 MG  tablet Commonly known as:  NORCO/VICODIN Take 1-2 tablets by mouth every 4 (four) hours as needed for moderate pain.   meloxicam 15 MG tablet Commonly known as:  MOBIC Take 15 mg by mouth daily.   multivitamin with minerals Tabs tablet Take 1 tablet by mouth daily.   nebivolol 10 MG tablet Commonly known as:  BYSTOLIC Take 10 mg by mouth daily.   pregabalin 75 MG capsule Commonly known as:  LYRICA Take 75 mg by mouth daily.   sertraline 50 MG tablet Commonly known as:  ZOLOFT Take 50 mg by mouth daily.        Signed: Kathaleen Maser Shana Younge 10/10/2018, 9:58 AM

## 2018-12-09 ENCOUNTER — Other Ambulatory Visit: Payer: Self-pay | Admitting: Neurosurgery

## 2018-12-09 DIAGNOSIS — M5416 Radiculopathy, lumbar region: Secondary | ICD-10-CM

## 2018-12-20 ENCOUNTER — Ambulatory Visit
Admission: RE | Admit: 2018-12-20 | Discharge: 2018-12-20 | Disposition: A | Discharge status: Home / Self Care | Payer: Medicare Other | Source: Ambulatory Visit | Attending: Neurosurgery | Admitting: Neurosurgery

## 2018-12-20 DIAGNOSIS — M5416 Radiculopathy, lumbar region: Secondary | ICD-10-CM

## 2018-12-20 MED ORDER — GADOBENATE DIMEGLUMINE 529 MG/ML IV SOLN
20.0000 mL | Freq: Once | INTRAVENOUS | Status: AC | PRN
  Administered 2018-12-20: 20 mL via INTRAVENOUS

## 2019-03-06 ENCOUNTER — Other Ambulatory Visit (HOSPITAL_COMMUNITY): Payer: Self-pay | Admitting: Neurosurgery

## 2019-03-06 ENCOUNTER — Ambulatory Visit (HOSPITAL_COMMUNITY)
Admission: RE | Admit: 2019-03-06 | Discharge: 2019-03-06 | Disposition: A | Discharge status: Home / Self Care | Payer: Medicare Other | Source: Ambulatory Visit | Attending: Family | Admitting: Family

## 2019-03-06 DIAGNOSIS — M5416 Radiculopathy, lumbar region: Secondary | ICD-10-CM | POA: Diagnosis present

## 2019-04-28 ENCOUNTER — Other Ambulatory Visit: Payer: Self-pay | Admitting: Neurosurgery

## 2019-04-28 ENCOUNTER — Other Ambulatory Visit (HOSPITAL_COMMUNITY): Payer: Self-pay | Admitting: Neurosurgery

## 2019-04-28 DIAGNOSIS — M5416 Radiculopathy, lumbar region: Secondary | ICD-10-CM

## 2019-05-20 ENCOUNTER — Telehealth: Payer: Self-pay | Admitting: Nurse Practitioner

## 2019-05-20 NOTE — Telephone Encounter (Signed)
Phone call to patient to verify medication list and allergies for myelogram procedure. Pt instructed to hold Zoloft for 48hrs prior to myelogram appointment time. Pt verbalized understanding. Pre and post procedure instructions reviewed with pt. 

## 2019-06-11 ENCOUNTER — Ambulatory Visit
Admission: RE | Admit: 2019-06-11 | Discharge: 2019-06-11 | Disposition: A | Discharge status: Home / Self Care | Payer: Medicare Other | Source: Ambulatory Visit | Attending: Neurosurgery | Admitting: Neurosurgery

## 2019-06-11 DIAGNOSIS — M5416 Radiculopathy, lumbar region: Secondary | ICD-10-CM

## 2019-06-11 MED ORDER — IOPAMIDOL (ISOVUE-M 200) INJECTION 41%
18.0000 mL | Freq: Once | INTRAMUSCULAR | Status: AC
  Administered 2019-06-11: 18 mL via INTRATHECAL

## 2019-06-11 MED ORDER — DIAZEPAM 5 MG PO TABS
5.0000 mg | ORAL_TABLET | Freq: Once | ORAL | Status: AC
  Administered 2019-06-11: 5 mg via ORAL

## 2019-06-11 NOTE — Progress Notes (Signed)
Pt reports she has been off of her zoloft for at least 48hrs for her myelogram procedure today.

## 2019-06-11 NOTE — Discharge Instructions (Signed)
Myelogram Discharge Instructions  1. Go home and rest quietly for the next 24 hours.  It is important to lie flat for the next 24 hours.  Get up only to go to the restroom.  You may lie in the bed or on a couch on your back, your stomach, your left side or your right side.  You may have one pillow under your head.  You may have pillows between your knees while you are on your side or under your knees while you are on your back.  2. DO NOT drive today.  Recline the seat as far back as it will go, while still wearing your seat belt, on the way home.  3. You may get up to go to the bathroom as needed.  You may sit up for 10 minutes to eat.  You may resume your normal diet and medications unless otherwise indicated.  Drink lots of extra fluids today and tomorrow.  4. The incidence of headache, nausea, or vomiting is about 5% (one in 20 patients).  If you develop a headache, lie flat and drink plenty of fluids until the headache goes away.  Caffeinated beverages may be helpful.  If you develop severe nausea and vomiting or a headache that does not go away with flat bed rest, call [phone].  5. You may resume normal activities after your 24 hours of bed rest is over; however, do not exert yourself strongly or do any heavy lifting tomorrow. If when you get up you have a headache when standing, go back to bed and force fluids for another 24 hours.  6. Call your physician for a follow-up appointment.  The results of your myelogram will be sent directly to your physician by the following day.  7. If you have any questions or if complications develop after you arrive home, please call [phone].  Discharge instructions have been explained to the patient.  The patient, or the person responsible for the patient, fully understands these instructions.  YOU MAY RESTART YOUR ZOLOFT TOMORROW 06/12/2019 AT 10:50AM.

## 2019-06-17 ENCOUNTER — Other Ambulatory Visit: Payer: Self-pay | Admitting: Neurosurgery

## 2019-06-18 ENCOUNTER — Other Ambulatory Visit (HOSPITAL_COMMUNITY)
Admission: RE | Admit: 2019-06-18 | Discharge: 2019-06-18 | Disposition: A | Discharge status: Home / Self Care | Payer: Medicare Other | Source: Ambulatory Visit | Attending: Neurosurgery | Admitting: Neurosurgery

## 2019-06-18 DIAGNOSIS — Z1159 Encounter for screening for other viral diseases: Secondary | ICD-10-CM | POA: Diagnosis present

## 2019-06-18 LAB — SARS CORONAVIRUS 2 (TAT 6-24 HRS): SARS Coronavirus 2: NEGATIVE

## 2019-06-19 ENCOUNTER — Encounter (HOSPITAL_COMMUNITY): Payer: Self-pay | Admitting: *Deleted

## 2019-06-19 ENCOUNTER — Other Ambulatory Visit: Payer: Self-pay

## 2019-06-19 NOTE — Progress Notes (Signed)
Spoke with pt for pre-op call, she then asked that I talk with her husband about her medical history. Pt has seen a cardiologist years ago (Dr. Doreatha Lew) for an irregular heart rate. Was put on Bystolic and has not had any issues since. Dr. Edrick Oh has been her PCP and follows that now. Mr. Cappiello states pt is not diabetic.   Pt had Covid test yesterday and it is negative. She and Mr. Roads voice understanding of quarantine until day of surgery.    Coronavirus Screening  Have you experienced the following symptoms:  Cough NO Fever (>100.7F)  NO Runny nose NO Sore throat NO Difficulty breathing/shortness of breath  NO  Have you or a family member traveled in the last 14 days and where? NO   Mr. Plaugher reminded that hospital visitation restrictions are in effect and the importance of the restrictions.

## 2019-06-21 NOTE — Anesthesia Preprocedure Evaluation (Addendum)
Anesthesia Evaluation  Patient identified by MRN, date of birth, ID band Patient awake    Reviewed: Allergy & Precautions, H&P , NPO status , Patient's Chart, lab work & pertinent test results, reviewed documented beta blocker date and time   History of Anesthesia Complications Negative for: history of anesthetic complications  Airway Mallampati: II  TM Distance: >3 FB Neck ROM: Full    Dental  (+) Poor Dentition, Missing, Dental Advisory Given   Pulmonary sleep apnea (does not use CPAP) ,    Pulmonary exam normal        Cardiovascular hypertension, Pt. on medications and Pt. on home beta blockers Normal cardiovascular exam  '05 cath: normal coronaries, normal LV   Neuro/Psych  Headaches, negative psych ROS   GI/Hepatic Neg liver ROS, GERD  Medicated and Controlled,  Endo/Other  Morbid obesity  Renal/GU negative Renal ROS     Musculoskeletal  (+) Arthritis , Osteoarthritis,  Fibromyalgia -  Abdominal (+) - obese,   Peds  Hematology negative hematology ROS (+)   Anesthesia Other Findings   Reproductive/Obstetrics                            Anesthesia Physical  Anesthesia Plan  ASA: III  Anesthesia Plan: General   Post-op Pain Management:    Induction: Intravenous  PONV Risk Score and Plan: 3 and Ondansetron, Dexamethasone and Diphenhydramine  Airway Management Planned: Oral ETT  Additional Equipment:   Intra-op Plan:   Post-operative Plan: Extubation in OR  Informed Consent: I have reviewed the patients History and Physical, chart, labs and discussed the procedure including the risks, benefits and alternatives for the proposed anesthesia with the patient or authorized representative who has indicated his/her understanding and acceptance.     Dental advisory given  Plan Discussed with: CRNA and Anesthesiologist  Anesthesia Plan Comments:        Anesthesia Quick  Evaluation

## 2019-06-22 ENCOUNTER — Inpatient Hospital Stay (HOSPITAL_COMMUNITY): Payer: Medicare Other

## 2019-06-22 ENCOUNTER — Inpatient Hospital Stay (HOSPITAL_COMMUNITY): Payer: Medicare Other | Admitting: Anesthesiology

## 2019-06-22 ENCOUNTER — Encounter (HOSPITAL_COMMUNITY)
Admission: RE | Disposition: A | Discharge status: Home / Self Care | Payer: Self-pay | Source: Home / Self Care | Attending: Neurosurgery

## 2019-06-22 ENCOUNTER — Inpatient Hospital Stay (HOSPITAL_COMMUNITY)
Admission: RE | Admit: 2019-06-22 | Discharge: 2019-06-23 | DRG: 455 | Disposition: A | Discharge status: Home / Self Care | Payer: Medicare Other | Attending: Neurosurgery | Admitting: Neurosurgery

## 2019-06-22 ENCOUNTER — Encounter (HOSPITAL_COMMUNITY): Payer: Self-pay

## 2019-06-22 ENCOUNTER — Other Ambulatory Visit: Payer: Self-pay

## 2019-06-22 DIAGNOSIS — Z882 Allergy status to sulfonamides status: Secondary | ICD-10-CM

## 2019-06-22 DIAGNOSIS — K219 Gastro-esophageal reflux disease without esophagitis: Secondary | ICD-10-CM | POA: Diagnosis present

## 2019-06-22 DIAGNOSIS — G4730 Sleep apnea, unspecified: Secondary | ICD-10-CM | POA: Diagnosis present

## 2019-06-22 DIAGNOSIS — Z7982 Long term (current) use of aspirin: Secondary | ICD-10-CM

## 2019-06-22 DIAGNOSIS — Z419 Encounter for procedure for purposes other than remedying health state, unspecified: Secondary | ICD-10-CM

## 2019-06-22 DIAGNOSIS — Z791 Long term (current) use of non-steroidal anti-inflammatories (NSAID): Secondary | ICD-10-CM

## 2019-06-22 DIAGNOSIS — I10 Essential (primary) hypertension: Secondary | ICD-10-CM | POA: Diagnosis present

## 2019-06-22 DIAGNOSIS — M1990 Unspecified osteoarthritis, unspecified site: Secondary | ICD-10-CM | POA: Diagnosis present

## 2019-06-22 DIAGNOSIS — M5417 Radiculopathy, lumbosacral region: Secondary | ICD-10-CM | POA: Diagnosis present

## 2019-06-22 DIAGNOSIS — Z87442 Personal history of urinary calculi: Secondary | ICD-10-CM | POA: Diagnosis not present

## 2019-06-22 DIAGNOSIS — M4317 Spondylolisthesis, lumbosacral region: Principal | ICD-10-CM | POA: Diagnosis present

## 2019-06-22 DIAGNOSIS — M4807 Spinal stenosis, lumbosacral region: Secondary | ICD-10-CM | POA: Diagnosis present

## 2019-06-22 DIAGNOSIS — Z6838 Body mass index (BMI) 38.0-38.9, adult: Secondary | ICD-10-CM

## 2019-06-22 DIAGNOSIS — Z79899 Other long term (current) drug therapy: Secondary | ICD-10-CM

## 2019-06-22 DIAGNOSIS — M797 Fibromyalgia: Secondary | ICD-10-CM | POA: Diagnosis present

## 2019-06-22 DIAGNOSIS — M4310 Spondylolisthesis, site unspecified: Secondary | ICD-10-CM | POA: Diagnosis present

## 2019-06-22 DIAGNOSIS — Z888 Allergy status to other drugs, medicaments and biological substances status: Secondary | ICD-10-CM | POA: Diagnosis not present

## 2019-06-22 HISTORY — DX: Personal history of urinary calculi: Z87.442

## 2019-06-22 LAB — TYPE AND SCREEN
ABO/RH(D): A POS
Antibody Screen: NEGATIVE

## 2019-06-22 LAB — ABO/RH: ABO/RH(D): A POS

## 2019-06-22 SURGERY — POSTERIOR LUMBAR FUSION 1 LEVEL
Anesthesia: General | Site: Back

## 2019-06-22 MED ORDER — PHENOL 1.4 % MT LIQD: 1.0000 | OROMUCOSAL | Status: DC | PRN

## 2019-06-22 MED ORDER — MENTHOL 3 MG MT LOZG
1.0000 | LOZENGE | OROMUCOSAL | Status: DC | PRN
  Administered 2019-06-22: 3 mg via ORAL
  Filled 2019-06-22: qty 9

## 2019-06-22 MED ORDER — DIAZEPAM 5 MG PO TABS
5.0000 mg | ORAL_TABLET | Freq: Four times a day (QID) | ORAL | Status: DC | PRN
  Administered 2019-06-22: 5 mg via ORAL

## 2019-06-22 MED ORDER — SUCCINYLCHOLINE CHLORIDE 200 MG/10ML IV SOSY
PREFILLED_SYRINGE | INTRAVENOUS | Status: DC | PRN
  Administered 2019-06-22: 120 mg via INTRAVENOUS

## 2019-06-22 MED ORDER — PROPOFOL 10 MG/ML IV BOLUS
INTRAVENOUS | Status: AC
  Filled 2019-06-22: qty 40

## 2019-06-22 MED ORDER — CHLORHEXIDINE GLUCONATE CLOTH 2 % EX PADS: 6.0000 | MEDICATED_PAD | Freq: Once | CUTANEOUS | Status: DC

## 2019-06-22 MED ORDER — SODIUM CHLORIDE 0.9% FLUSH: 3.0000 mL | INTRAVENOUS | Status: DC | PRN

## 2019-06-22 MED ORDER — LIDOCAINE 2% (20 MG/ML) 5 ML SYRINGE
INTRAMUSCULAR | Status: AC
  Filled 2019-06-22: qty 5

## 2019-06-22 MED ORDER — ADULT MULTIVITAMIN W/MINERALS CH
1.0000 | ORAL_TABLET | Freq: Every day | ORAL | Status: DC
  Administered 2019-06-22: 1 via ORAL
  Filled 2019-06-22: qty 1

## 2019-06-22 MED ORDER — POLYETHYLENE GLYCOL 3350 17 G PO PACK: 17.0000 g | PACK | Freq: Every day | ORAL | Status: DC | PRN

## 2019-06-22 MED ORDER — THROMBIN 20000 UNITS EX SOLR
CUTANEOUS | Status: AC
  Filled 2019-06-22: qty 20000

## 2019-06-22 MED ORDER — ALBUMIN HUMAN 5 % IV SOLN
INTRAVENOUS | Status: DC | PRN
  Administered 2019-06-22: 09:00:00 via INTRAVENOUS

## 2019-06-22 MED ORDER — SODIUM CHLORIDE 0.9 % IV SOLN: 250.0000 mL | INTRAVENOUS | Status: DC

## 2019-06-22 MED ORDER — SODIUM CHLORIDE 0.9 % IV SOLN
INTRAVENOUS | Status: DC | PRN
  Administered 2019-06-22: 08:00:00 25 ug/min via INTRAVENOUS

## 2019-06-22 MED ORDER — DEXAMETHASONE SODIUM PHOSPHATE 4 MG/ML IJ SOLN
INTRAMUSCULAR | Status: DC | PRN
  Administered 2019-06-22: 5 mg via INTRAVENOUS

## 2019-06-22 MED ORDER — KETAMINE HCL 10 MG/ML IJ SOLN
INTRAMUSCULAR | Status: DC | PRN
  Administered 2019-06-22: 40 mg via INTRAVENOUS

## 2019-06-22 MED ORDER — FENTANYL CITRATE (PF) 100 MCG/2ML IJ SOLN
INTRAMUSCULAR | Status: AC
  Filled 2019-06-22: qty 2

## 2019-06-22 MED ORDER — ONDANSETRON HCL 4 MG PO TABS: 4.0000 mg | ORAL_TABLET | Freq: Four times a day (QID) | ORAL | Status: DC | PRN

## 2019-06-22 MED ORDER — BUPIVACAINE HCL (PF) 0.25 % IJ SOLN
INTRAMUSCULAR | Status: AC
  Filled 2019-06-22: qty 30

## 2019-06-22 MED ORDER — DIAZEPAM 5 MG PO TABS
ORAL_TABLET | ORAL | Status: AC
  Administered 2019-06-22: 5 mg via ORAL
  Filled 2019-06-22: qty 1

## 2019-06-22 MED ORDER — FENTANYL CITRATE (PF) 250 MCG/5ML IJ SOLN
INTRAMUSCULAR | Status: AC
  Filled 2019-06-22: qty 5

## 2019-06-22 MED ORDER — EPHEDRINE 5 MG/ML INJ
INTRAVENOUS | Status: AC
  Filled 2019-06-22: qty 10

## 2019-06-22 MED ORDER — SODIUM CHLORIDE 0.9 % IV SOLN
INTRAVENOUS | Status: DC | PRN
  Administered 2019-06-22: 500 mL

## 2019-06-22 MED ORDER — LACTATED RINGERS IV SOLN
INTRAVENOUS | Status: DC
  Administered 2019-06-22: 08:00:00 via INTRAVENOUS

## 2019-06-22 MED ORDER — POLYVINYL ALCOHOL 1.4 % OP SOLN
Freq: Every day | OPHTHALMIC | Status: DC | PRN
  Filled 2019-06-22: qty 15

## 2019-06-22 MED ORDER — ACETAMINOPHEN 325 MG PO TABS: 650.0000 mg | ORAL_TABLET | ORAL | Status: DC | PRN

## 2019-06-22 MED ORDER — OXYCODONE HCL 5 MG PO TABS
ORAL_TABLET | ORAL | Status: AC
  Administered 2019-06-22: 10 mg via ORAL
  Filled 2019-06-22: qty 2

## 2019-06-22 MED ORDER — VANCOMYCIN HCL 1 G IV SOLR
INTRAVENOUS | Status: DC | PRN
  Administered 2019-06-22: 1000 mg

## 2019-06-22 MED ORDER — PROPOFOL 10 MG/ML IV BOLUS
INTRAVENOUS | Status: DC | PRN
  Administered 2019-06-22: 140 mg via INTRAVENOUS

## 2019-06-22 MED ORDER — PROMETHAZINE HCL 25 MG/ML IJ SOLN: 6.2500 mg | INTRAMUSCULAR | Status: DC | PRN

## 2019-06-22 MED ORDER — FENTANYL CITRATE (PF) 250 MCG/5ML IJ SOLN
INTRAMUSCULAR | Status: DC | PRN
  Administered 2019-06-22: 100 ug via INTRAVENOUS
  Administered 2019-06-22: 50 ug via INTRAVENOUS

## 2019-06-22 MED ORDER — CEFAZOLIN SODIUM-DEXTROSE 2-4 GM/100ML-% IV SOLN
2.0000 g | INTRAVENOUS | Status: AC
  Administered 2019-06-22: 2 g via INTRAVENOUS
  Filled 2019-06-22: qty 100

## 2019-06-22 MED ORDER — FENTANYL CITRATE (PF) 100 MCG/2ML IJ SOLN
25.0000 ug | INTRAMUSCULAR | Status: DC | PRN
  Administered 2019-06-22: 50 ug via INTRAVENOUS

## 2019-06-22 MED ORDER — CALCIUM CARBONATE 1250 (500 CA) MG PO TABS
1.0000 | ORAL_TABLET | Freq: Every day | ORAL | Status: DC
  Filled 2019-06-22: qty 1

## 2019-06-22 MED ORDER — BISACODYL 10 MG RE SUPP: 10.0000 mg | Freq: Every day | RECTAL | Status: DC | PRN

## 2019-06-22 MED ORDER — OXYCODONE HCL 5 MG PO TABS
10.0000 mg | ORAL_TABLET | ORAL | Status: DC | PRN
  Administered 2019-06-22 (×2): 10 mg via ORAL
  Filled 2019-06-22: qty 2

## 2019-06-22 MED ORDER — LIDOCAINE 2% (20 MG/ML) 5 ML SYRINGE
INTRAMUSCULAR | Status: DC | PRN
  Administered 2019-06-22: 40 mg via INTRAVENOUS
  Administered 2019-06-22: 60 mg via INTRAVENOUS

## 2019-06-22 MED ORDER — GABAPENTIN 300 MG PO CAPS
300.0000 mg | ORAL_CAPSULE | Freq: Three times a day (TID) | ORAL | Status: DC
  Administered 2019-06-22 – 2019-06-23 (×3): 300 mg via ORAL
  Filled 2019-06-22 (×3): qty 1

## 2019-06-22 MED ORDER — EPHEDRINE SULFATE 50 MG/ML IJ SOLN
INTRAMUSCULAR | Status: DC | PRN
  Administered 2019-06-22 (×2): 10 mg via INTRAVENOUS

## 2019-06-22 MED ORDER — ONDANSETRON HCL 4 MG/2ML IJ SOLN: 4.0000 mg | Freq: Four times a day (QID) | INTRAMUSCULAR | Status: DC | PRN

## 2019-06-22 MED ORDER — CALCIUM CARBONATE 1500 (600 CA) MG PO TABS
600.0000 mg | ORAL_TABLET | Freq: Every day | ORAL | Status: DC
  Filled 2019-06-22: qty 1

## 2019-06-22 MED ORDER — SUCCINYLCHOLINE CHLORIDE 200 MG/10ML IV SOSY
PREFILLED_SYRINGE | INTRAVENOUS | Status: AC
  Filled 2019-06-22: qty 10

## 2019-06-22 MED ORDER — DEXAMETHASONE SODIUM PHOSPHATE 10 MG/ML IJ SOLN
10.0000 mg | INTRAMUSCULAR | Status: DC
  Filled 2019-06-22: qty 1

## 2019-06-22 MED ORDER — FLEET ENEMA 7-19 GM/118ML RE ENEM: 1.0000 | ENEMA | Freq: Once | RECTAL | Status: DC | PRN

## 2019-06-22 MED ORDER — ACETAMINOPHEN 650 MG RE SUPP: 650.0000 mg | RECTAL | Status: DC | PRN

## 2019-06-22 MED ORDER — CEFAZOLIN SODIUM-DEXTROSE 1-4 GM/50ML-% IV SOLN
1.0000 g | Freq: Three times a day (TID) | INTRAVENOUS | Status: AC
  Administered 2019-06-22 (×2): 1 g via INTRAVENOUS
  Filled 2019-06-22 (×2): qty 50

## 2019-06-22 MED ORDER — HYDROCODONE-ACETAMINOPHEN 10-325 MG PO TABS
1.0000 | ORAL_TABLET | ORAL | Status: DC | PRN
  Administered 2019-06-22 – 2019-06-23 (×3): 1 via ORAL
  Filled 2019-06-22 (×3): qty 1

## 2019-06-22 MED ORDER — PREGABALIN 75 MG PO CAPS
75.0000 mg | ORAL_CAPSULE | Freq: Three times a day (TID) | ORAL | Status: DC
  Administered 2019-06-22 – 2019-06-23 (×3): 75 mg via ORAL
  Filled 2019-06-22 (×3): qty 1

## 2019-06-22 MED ORDER — ONDANSETRON HCL 4 MG/2ML IJ SOLN
INTRAMUSCULAR | Status: AC
  Filled 2019-06-22: qty 2

## 2019-06-22 MED ORDER — KETAMINE HCL 50 MG/5ML IJ SOSY
PREFILLED_SYRINGE | INTRAMUSCULAR | Status: AC
  Filled 2019-06-22: qty 5

## 2019-06-22 MED ORDER — ROCURONIUM BROMIDE 10 MG/ML (PF) SYRINGE
PREFILLED_SYRINGE | INTRAVENOUS | Status: AC
  Filled 2019-06-22: qty 10

## 2019-06-22 MED ORDER — 0.9 % SODIUM CHLORIDE (POUR BTL) OPTIME
TOPICAL | Status: DC | PRN
  Administered 2019-06-22: 1000 mL

## 2019-06-22 MED ORDER — ROCURONIUM BROMIDE 50 MG/5ML IV SOSY
PREFILLED_SYRINGE | INTRAVENOUS | Status: DC | PRN
  Administered 2019-06-22: 20 mg via INTRAVENOUS
  Administered 2019-06-22: 40 mg via INTRAVENOUS
  Administered 2019-06-22: 20 mg via INTRAVENOUS

## 2019-06-22 MED ORDER — NEBIVOLOL HCL 10 MG PO TABS
10.0000 mg | ORAL_TABLET | Freq: Once | ORAL | Status: AC
  Administered 2019-06-22: 10 mg via ORAL
  Filled 2019-06-22: qty 1

## 2019-06-22 MED ORDER — DEXAMETHASONE SODIUM PHOSPHATE 10 MG/ML IJ SOLN
INTRAMUSCULAR | Status: AC
  Filled 2019-06-22: qty 1

## 2019-06-22 MED ORDER — SODIUM CHLORIDE 0.9% FLUSH
3.0000 mL | Freq: Two times a day (BID) | INTRAVENOUS | Status: DC
  Administered 2019-06-22: 3 mL via INTRAVENOUS

## 2019-06-22 MED ORDER — VANCOMYCIN HCL 1000 MG IV SOLR
INTRAVENOUS | Status: AC
  Filled 2019-06-22: qty 1000

## 2019-06-22 MED ORDER — HYDROMORPHONE HCL 1 MG/ML IJ SOLN: 1.0000 mg | INTRAMUSCULAR | Status: DC | PRN

## 2019-06-22 MED ORDER — SUGAMMADEX SODIUM 200 MG/2ML IV SOLN
INTRAVENOUS | Status: DC | PRN
  Administered 2019-06-22: 192.4 mg via INTRAVENOUS

## 2019-06-22 MED ORDER — LACTATED RINGERS IV SOLN
INTRAVENOUS | Status: DC | PRN
  Administered 2019-06-22: 08:00:00 via INTRAVENOUS

## 2019-06-22 MED ORDER — THROMBIN 20000 UNITS EX SOLR
CUTANEOUS | Status: DC | PRN
  Administered 2019-06-22: 20 mL

## 2019-06-22 MED ORDER — NEBIVOLOL HCL 10 MG PO TABS
10.0000 mg | ORAL_TABLET | Freq: Every day | ORAL | Status: DC
  Administered 2019-06-23: 10 mg via ORAL
  Filled 2019-06-22: qty 1

## 2019-06-22 MED ORDER — BUPIVACAINE HCL (PF) 0.25 % IJ SOLN
INTRAMUSCULAR | Status: DC | PRN
  Administered 2019-06-22: 20 mL

## 2019-06-22 MED ORDER — SERTRALINE HCL 50 MG PO TABS
50.0000 mg | ORAL_TABLET | Freq: Every day | ORAL | Status: DC
  Administered 2019-06-23: 10:00:00 50 mg via ORAL
  Filled 2019-06-22: qty 1

## 2019-06-22 SURGICAL SUPPLY — 59 items
BAG DECANTER FOR FLEXI CONT (MISCELLANEOUS) ×2 IMPLANT
BENZOIN TINCTURE PRP APPL 2/3 (GAUZE/BANDAGES/DRESSINGS) ×2 IMPLANT
BLADE CLIPPER SURG (BLADE) IMPLANT
BUR CUTTER 7.0 ROUND (BURR) IMPLANT
BUR MATCHSTICK NEURO 3.0 LAGG (BURR) ×2 IMPLANT
CANISTER SUCT 3000ML PPV (MISCELLANEOUS) ×2 IMPLANT
CAP LCK SPNE (Orthopedic Implant) ×4 IMPLANT
CAP LOCK SPINE RADIUS (Orthopedic Implant) IMPLANT
CAP LOCKING (Orthopedic Implant) ×4 IMPLANT
CARTRIDGE OIL MAESTRO DRILL (MISCELLANEOUS) ×1 IMPLANT
CONT SPEC 4OZ CLIKSEAL STRL BL (MISCELLANEOUS) ×2 IMPLANT
COVER BACK TABLE 60X90IN (DRAPES) ×2 IMPLANT
COVER WAND RF STERILE (DRAPES) ×1 IMPLANT
DECANTER SPIKE VIAL GLASS SM (MISCELLANEOUS) ×2 IMPLANT
DERMABOND ADVANCED (GAUZE/BANDAGES/DRESSINGS) ×1
DERMABOND ADVANCED .7 DNX12 (GAUZE/BANDAGES/DRESSINGS) ×1 IMPLANT
DEVICE INTERBODY ELEVATE 10X23 (Cage) ×2 IMPLANT
DIFFUSER DRILL AIR PNEUMATIC (MISCELLANEOUS) ×2 IMPLANT
DRAPE C-ARM 42X72 X-RAY (DRAPES) ×4 IMPLANT
DRAPE HALF SHEET 40X57 (DRAPES) IMPLANT
DRAPE LAPAROTOMY 100X72X124 (DRAPES) ×2 IMPLANT
DRAPE SURG 17X23 STRL (DRAPES) ×8 IMPLANT
DRSG OPSITE POSTOP 4X6 (GAUZE/BANDAGES/DRESSINGS) ×2 IMPLANT
DURAPREP 26ML APPLICATOR (WOUND CARE) ×2 IMPLANT
ELECT REM PT RETURN 9FT ADLT (ELECTROSURGICAL) ×2
ELECTRODE REM PT RTRN 9FT ADLT (ELECTROSURGICAL) ×1 IMPLANT
EVACUATOR 1/8 PVC DRAIN (DRAIN) IMPLANT
GAUZE 4X4 16PLY RFD (DISPOSABLE) IMPLANT
GAUZE SPONGE 4X4 12PLY STRL (GAUZE/BANDAGES/DRESSINGS) IMPLANT
GLOVE BIO SURGEON STRL SZ 6.5 (GLOVE) ×3 IMPLANT
GLOVE BIOGEL PI IND STRL 6.5 (GLOVE) IMPLANT
GLOVE BIOGEL PI INDICATOR 6.5 (GLOVE) ×3
GLOVE ECLIPSE 9.0 STRL (GLOVE) ×4 IMPLANT
GLOVE OPTIFIT SS 6.5 STRL BRWN (GLOVE) ×5 IMPLANT
GLOVE SS N UNI LF 7.5 STRL (GLOVE) ×1 IMPLANT
GOWN STRL REUS W/ TWL LRG LVL3 (GOWN DISPOSABLE) IMPLANT
GOWN STRL REUS W/ TWL XL LVL3 (GOWN DISPOSABLE) ×2 IMPLANT
GOWN STRL REUS W/TWL 2XL LVL3 (GOWN DISPOSABLE) IMPLANT
GOWN STRL REUS W/TWL LRG LVL3 (GOWN DISPOSABLE) ×3
GOWN STRL REUS W/TWL XL LVL3 (GOWN DISPOSABLE) ×2
KIT BASIN OR (CUSTOM PROCEDURE TRAY) ×2 IMPLANT
KIT TURNOVER KIT B (KITS) ×2 IMPLANT
MILL MEDIUM DISP (BLADE) ×2 IMPLANT
NEEDLE HYPO 22GX1.5 SAFETY (NEEDLE) ×2 IMPLANT
NS IRRIG 1000ML POUR BTL (IV SOLUTION) ×2 IMPLANT
OIL CARTRIDGE MAESTRO DRILL (MISCELLANEOUS) ×2
PACK LAMINECTOMY NEURO (CUSTOM PROCEDURE TRAY) ×2 IMPLANT
ROD RADIUS 35MM (Rod) ×2 IMPLANT
SCREW 5.75X45MM (Screw) ×4 IMPLANT
SPONGE SURGIFOAM ABS GEL 100 (HEMOSTASIS) ×2 IMPLANT
STRIP CLOSURE SKIN 1/2X4 (GAUZE/BANDAGES/DRESSINGS) ×3 IMPLANT
SUT VIC AB 0 CT1 18XCR BRD8 (SUTURE) ×2 IMPLANT
SUT VIC AB 0 CT1 8-18 (SUTURE) ×1
SUT VIC AB 2-0 CT1 18 (SUTURE) ×2 IMPLANT
SUT VIC AB 3-0 SH 8-18 (SUTURE) ×3 IMPLANT
TOWEL GREEN STERILE (TOWEL DISPOSABLE) ×2 IMPLANT
TOWEL GREEN STERILE FF (TOWEL DISPOSABLE) ×2 IMPLANT
TRAY FOLEY MTR SLVR 16FR STAT (SET/KITS/TRAYS/PACK) ×1 IMPLANT
WATER STERILE IRR 1000ML POUR (IV SOLUTION) ×2 IMPLANT

## 2019-06-22 NOTE — Anesthesia Procedure Notes (Signed)
Procedure Name: Intubation Date/Time: 06/22/2019 8:13 AM Performed by: Glynda Jaeger, CRNA Pre-anesthesia Checklist: Patient identified, Patient being monitored, Timeout performed, Emergency Drugs available and Suction available Patient Re-evaluated:Patient Re-evaluated prior to induction Oxygen Delivery Method: Circle System Utilized Preoxygenation: Pre-oxygenation with 100% oxygen Induction Type: IV induction Laryngoscope Size: Mac and 3 Grade View: Grade I Tube type: Oral Tube size: 7.5 mm Number of attempts: 1 Airway Equipment and Method: Stylet Placement Confirmation: ETT inserted through vocal cords under direct vision,  positive ETCO2 and breath sounds checked- equal and bilateral Secured at: 20 cm Tube secured with: Tape Dental Injury: Teeth and Oropharynx as per pre-operative assessment  Comments: RSI

## 2019-06-22 NOTE — Anesthesia Postprocedure Evaluation (Signed)
Anesthesia Post Note  Patient: Emily Mcbride  Procedure(s) Performed: PLIF - L5-S1 (N/A Back)     Patient location during evaluation: PACU Anesthesia Type: General Level of consciousness: sedated Pain management: pain level controlled Vital Signs Assessment: post-procedure vital signs reviewed and stable Respiratory status: spontaneous breathing and respiratory function stable Cardiovascular status: stable Postop Assessment: no apparent nausea or vomiting Anesthetic complications: no    Last Vitals:  Vitals:   06/22/19 1155 06/22/19 1233  BP:  135/70  Pulse:  62  Resp:  16  Temp: 36.5 C (!) 36.4 C  SpO2:  97%    Last Pain:  Vitals:   06/22/19 1233  TempSrc: Oral  PainSc:                  Avante Carneiro DANIEL

## 2019-06-22 NOTE — Brief Op Note (Signed)
06/22/2019  11:02 AM  PATIENT:  Emily Mcbride  78 y.o. female  PRE-OPERATIVE DIAGNOSIS:  Spondylolisthesis  POST-OPERATIVE DIAGNOSIS:  Spondylolisthesis  PROCEDURE:  Procedure(s) with comments: PLIF - L5-S1 (N/A) - PLIF - L5-S1  SURGEON:  Surgeon(s) and Role:    * Earnie Larsson, MD - Primary  PHYSICIAN ASSISTANT:   ASSISTANTSReinaldo Meeker, NP   ANESTHESIA:   general  EBL:  300 mL   BLOOD ADMINISTERED:none  DRAINS: none   LOCAL MEDICATIONS USED:  MARCAINE     SPECIMEN:  No Specimen  DISPOSITION OF SPECIMEN:  N/A  COUNTS:  YES  TOURNIQUET:  * No tourniquets in log *  DICTATION: .Dragon Dictation  PLAN OF CARE: Admit to inpatient   PATIENT DISPOSITION:  PACU - hemodynamically stable.   Delay start of Pharmacological VTE agent (>24hrs) due to surgical blood loss or risk of bleeding: yes

## 2019-06-22 NOTE — Op Note (Signed)
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  L5-S1 posterior lumbar interbody fusion utilizing interbody expandable peek cage, locally harvested autograft,  L5-S1 posterior lateral arthrodesis utilizing nonsegmental pedicle screw fixation and local autografting  Surgeon:Myrlene Riera A.Ivann Trimarco, M.D.  Asst. Surgeon: Reinaldo Meeker, NP  Anesthesia: General  Indication: 78 year old female with intractable right lower extremity pain.  Work-up demonstrates evidence of unstable anterolisthesis at L5-S1 with severe foraminal stenosis.  Patient status post prior decompressive surgery on the right at L5-S1 x2.  Patient presents now for decompression and fusion L5-S1 in hopes of improving her symptoms.  Operative note: After induction anesthesia, patient position prone onto Wilson frame and appropriate padded.  Lumbar region prepped and draped sterilely.  Incision made overlying L5-S1.  Dissection performed bilaterally.  Retractor was placed.  Fluoroscopy was used.  Levels confirmed.  Previous laminotomy at L5-S1 dissected free on the right.  Bilateral L5-S1 redo laminotomies and complete facetectomies were performed.  Ligament flavum epidural scar were elevated.  Foraminotomies were completed on the course the exiting L5 and S1 nerve roots bilaterally.  Bilateral discectomies were then performed.  Dissipates then prepared for interbody fusion.  With a distractor placed the patient's right side to space was then further cleaned of soft tissue.  A 10 mm Medtronic expandable cage packed with locally harvested autograft was then impacted into place and expanded to its full extent.  Distractor  removed patient's right side.  The space then prepared on the right side.  After cleaning all soft tissue from the interspace morselized autograft was packed in the interspace and then a second cage was then impacted in place and expanded to its full extent.  Pedicles of L5 and S1 were then identified using surface landmarks and intraoperative fluoroscopy.  Superficial bone around the pedicle was then removed using high-speed drill.  Each pedicle was then probed using a pedicle awl.  Each pedicle all track was then probed and found to be solidly within the bone.  Each pedicle tract was then tapped and then 5.75 mm radius brand screws were placed bilaterally at L5-S1.  Final images reveal good position of the cages and the hardware at the proper upper level with normal alignment spine.  Wound is then irrigated one final time.  Short segment titanium rods and placed over the screw heads at L5 and S1. A.  The screw heads.  Locking caps then engaged.  Morselized autograft was then packed posterior laterally bilaterally along the transverse processes residual facet and sacral ala.  Gelfoam was placed over the laminotomy defects.  Vancomycin powder was placed in deep wound space.  Wounds and closed in layers with Vicryl sutures.  Steri-Strips and sterile dressing were applied.  No apparent complications.  Patient tolerated the procedure well and returned to recovery room postop.

## 2019-06-22 NOTE — Transfer of Care (Signed)
Immediate Anesthesia Transfer of Care Note  Patient: Tabitha M Neumeyer  Procedure(s) Performed: PLIF - L5-S1 (N/A Back)  Patient Location: PACU  Anesthesia Type:General  Level of Consciousness: awake, patient cooperative and responds to stimulation  Airway & Oxygen Therapy: Patient Spontanous Breathing and Patient connected to face mask oxygen  Post-op Assessment: Report given to RN and Post -op Vital signs reviewed and stable  Post vital signs: Reviewed and stable  Last Vitals:  Vitals Value Taken Time  BP    Temp    Pulse 72 06/22/19 1110  Resp 10 06/22/19 1110  SpO2 99 % 06/22/19 1110  Vitals shown include unvalidated device data.  Last Pain:  Vitals:   06/22/19 0702  PainSc: 5       Patients Stated Pain Goal: 3 (94/80/16 5537)  Complications: No apparent anesthesia complications

## 2019-06-22 NOTE — H&P (Signed)
Emily Mcbride is an 78 y.o. female.   Chief Complaint: Right leg pain HPI: 78 year old female with severe right lower extremity radicular pain.  Symptoms are aggravated by standing or walking.  Get she gets some relief by sitting.  She has known transitional anatomy with degenerative unstable spondylolisthesis at L5-S1.  She is status post prior decompressive surgery on the right at L5-S1.  Her pain is failed all efforts of conservative management.  She has minimal left-sided symptoms.  Her lower back pain is reasonably well controlled.  She presents now for L5-S1 decompression and fusion in hopes of improving her symptoms.  Past Medical History:  Diagnosis Date  . Anemia    as a child  . Arthritis    osteoarthritis  . Chorioretinitis   . Colitis   . Fibromyalgia   . GERD (gastroesophageal reflux disease)    no longer takes meds   . Headache(784.0)    due to a fall several mos.  . History of kidney stones   . Hypertension   . Rheumatic fever    as a child  . Skipped heart beats    due to rheumatic fever as a child  . Sleep apnea    does not use CPAP due discomfort    Past Surgical History:  Procedure Laterality Date  . CARDIAC CATHETERIZATION    . COLONOSCOPY    . DILATION AND CURETTAGE OF UTERUS    . LUMBAR LAMINECTOMY/DECOMPRESSION MICRODISCECTOMY Right 11/16/2013   Procedure: LUMBAR LAMINECTOMY/Laminotomy/DECOMPRESSION MICRODISCECTOMY RIGHT LUMBAR FOUR-FIVE;  Surgeon: Charlie Pitter, MD;  Location: [facility];  Service: Neurosurgery;  Laterality: Right;  LUMBAR LAMINECTOMY/Laminotomy/DECOMPRESSION MICRODISCECTOMY RIGHT LUMBAR FOUR-FIVE  . LUMBAR LAMINECTOMY/DECOMPRESSION MICRODISCECTOMY Right 10/09/2018   Procedure: LAMINECTOMY AND FORAMINOTOMY LUMBAR FIVE- SACRAL ONE;  Surgeon: Earnie Larsson, MD;  Location: [place];  Service: Neurosurgery;  Laterality: Right;  LAMINECTOMY AND FORAMINOTOMY LUMBAR FIVE- SACRAL ONE  . TONSILLECTOMY      History reviewed. No pertinent family  history. Social History:  reports that she has never smoked. She has never used smokeless tobacco. She reports that she does not drink alcohol or use drugs.  Allergies:  Allergies  Allergen Reactions  . Amaranth (Fd&C Red #2) Shortness Of Breath  . Sulfa Antibiotics Hives, Rash and Other (See Comments)    Blisters     Medications Prior to Admission  Medication Sig Dispense Refill  . acetaminophen (TYLENOL) 650 MG CR tablet Take 1,300 mg by mouth 2 (two) times daily.    Marland Kitchen aspirin EC 81 MG tablet Take 81 mg by mouth daily.    . calcium carbonate (OSCAL) 1500 (600 Ca) MG TABS tablet Take 600 mg of elemental calcium by mouth daily with breakfast.    . gabapentin (NEURONTIN) 300 MG capsule Take 300 mg by mouth 3 (three) times daily.    . meloxicam (MOBIC) 15 MG tablet Take 15 mg by mouth daily.    . Multiple Vitamin (MULTIVITAMIN WITH MINERALS) TABS tablet Take 1 tablet by mouth daily.    . nebivolol (BYSTOLIC) 10 MG tablet Take 10 mg by mouth daily.    Vladimir Faster Glycol-Propyl Glycol (SYSTANE OP) Place 1 drop into both eyes daily as needed (dry eyes).    . pregabalin (LYRICA) 75 MG capsule Take 75 mg by mouth 3 (three) times daily.     . sertraline (ZOLOFT) 50 MG tablet Take 50 mg by mouth daily.    . Menthol, Topical Analgesic, (BLUE-EMU MAXIMUM STRENGTH EX) Apply 1 application topically  daily as needed (back pain).      No results found for this or any previous visit (from the past 48 hour(s)). No results found.  Pertinent items noted in HPI and remainder of comprehensive ROS otherwise negative.  Blood pressure (!) 175/73, pulse 64, temperature 98.6 F (37 C), resp. rate 17, height 5\' 2"  (1.575 m), weight 96.2 kg, SpO2 98 %.  Patient is awake and alert.  She is oriented and appropriate.  Speech is fluent.  Judgment insight are intact.  Cranial nerve function normal bilaterally.  Motor examination reveals some mild weakness of dorsiflexion her right foot grading out at 4/5.  She has  decreased sensation pinprick and light touch in her right L5 dermatome.  Deep tendon reflexes are normal active scepter Achilles reflexes are absent bilaterally.  No evidence of long track signs.  Examination head ears eyes nose and throat is unremarked.  Chest and abdomen are obese but otherwise benign.  Extremities are free from injury or deformity. Assessment/Plan L5-S1 unstable grade 1 spondylolisthesis with chronic radiculopathy.  Plan bilateral L5-S1 decompressive laminotomies and foraminotomies followed by posterior lumbar interbody fusion utilizing interbody cages and local autograft coupled with posterior lateral arthrodesis utilizing nonsegmental pedicle screw fixation.  Risks and benefits of been explained.  Patient wishes to proceed.  Kathaleen MaserHenry A Iyanni Hepp 06/22/2019, 7:49 AM

## 2019-06-22 NOTE — Plan of Care (Signed)
Patient admitted and in stable condition at this time.

## 2019-06-23 MED ORDER — HYDROCODONE-ACETAMINOPHEN 10-325 MG PO TABS: 1.0000 | ORAL_TABLET | ORAL | 0 refills | Status: DC | PRN

## 2019-06-23 MED ORDER — CYCLOBENZAPRINE HCL 10 MG PO TABS: 10.0000 mg | ORAL_TABLET | Freq: Three times a day (TID) | ORAL | 0 refills | Status: DC | PRN

## 2019-06-23 MED FILL — Heparin Sodium (Porcine) Inj 1000 Unit/ML: INTRAMUSCULAR | Qty: 30 | Status: AC

## 2019-06-23 MED FILL — Sodium Chloride IV Soln 0.9%: INTRAVENOUS | Qty: 1000 | Status: AC

## 2019-06-23 NOTE — Evaluation (Signed)
Physical Therapy Evaluation Patient Details Name: Emily Mcbride MRN: [id number] DOB: 10/17/1941 Today's Date: 06/23/2019   History of Present Illness  Pt is a 78 y/o female who presents s/p L5-S1 PLIF on 06/22/2019. PMH significant for HTN, fibromyalgia, prior lumbar laminectomy/decompression x2.  Clinical Impression  Pt admitted with above diagnosis. Pt currently with functional limitations due to the deficits listed below (see PT Problem List). At the time of PT eval pt was able to perform transfers and ambulation with gross min guard assist for balance support and safety. Pt did require mod assist for stair negotiation, and was educated on needing husband close by and providing HHA to enter her home. She was also educated on brace application/wearing schedule, car transfer, and activity progression. Pt will benefit from skilled PT to increase their independence and safety with mobility to allow discharge to the venue listed below.       Follow Up Recommendations No PT follow up;Supervision - Intermittent    Equipment Recommendations  None recommended by PT    Recommendations for Other Services       Precautions / Restrictions Precautions Precautions: Fall;Back Precaution Booklet Issued: Yes (comment) Precaution Comments: Reviewed handout in detail and pt was cued for precautions during functional mobility.  Required Braces or Orthoses: Spinal Brace Spinal Brace: Lumbar corset;Applied in sitting position Restrictions Weight Bearing Restrictions: No      Mobility  Bed Mobility               General bed mobility comments: Pt was received up in room with NT.   Transfers Overall transfer level: Needs assistance Equipment used: Rolling walker (2 wheeled) Transfers: Sit to/from Stand Sit to Stand: Min guard         General transfer comment: Close guard for safety as pt powered-up to full stand. VC's for hand placement on seated surface for safety.    Ambulation/Gait Ambulation/Gait assistance: Min guard Gait Distance (Feet): 300 Feet Assistive device: Rolling walker (2 wheeled) Gait Pattern/deviations: Step-through pattern;Decreased stride length;Trunk flexed Gait velocity: Decreased Gait velocity interpretation: <1.8 ft/sec, indicate of risk for recurrent falls General Gait Details: Very slow and guarded, with rest breaks taken 2 fatigue and SOB. No overt LOB noted.   Stairs Stairs: Yes Stairs assistance: Mod assist Stair Management: One rail Left;Step to pattern;Forwards Number of Stairs: 2 General stair comments: HHA on R to simulate home environment. VC's for sequencing and general safety. Pt required mod assist for power-up to next step and also to control descent down.   Wheelchair Mobility    Modified Rankin (Stroke Patients Only)       Balance Overall balance assessment: Needs assistance Sitting-balance support: Feet supported;No upper extremity supported Sitting balance-Leahy Scale: Fair     Standing balance support: No upper extremity supported;During functional activity Standing balance-Leahy Scale: Poor Standing balance comment: reliant on RW for support                             Pertinent Vitals/Pain Pain Assessment: Faces Faces Pain Scale: Hurts a little bit Pain Location: Incision site Pain Descriptors / Indicators: Operative site guarding;Discomfort Pain Intervention(s): Limited activity within patient's tolerance;Monitored during session;Repositioned    Home Living Family/patient expects to be discharged to:: Private residence Living Arrangements: Spouse/significant other Available Help at Discharge: Family;Available 24 hours/day Type of Home: House Home Access: Stairs to enter Entrance Stairs-Rails: None Entrance Stairs-Number of Steps: 2 Home Layout: Two level Home Equipment: Shower  seat;Walker - 2 wheels;Walker - 4 wheels;Adaptive equipment      Prior Function Level of  Independence: Needs assistance   Gait / Transfers Assistance Needed: Using rollator all the time prior to surgery  ADL's / Homemaking Assistance Needed: reports independent with ADLs, limited IADLs        Hand Dominance        Extremity/Trunk Assessment   Upper Extremity Assessment Upper Extremity Assessment: Defer to OT evaluation    Lower Extremity Assessment Lower Extremity Assessment: Generalized weakness;LLE deficits/detail LLE Deficits / Details: Decreased muscular strength and endurance consistent with pre-op diagnosis    Cervical / Trunk Assessment Cervical / Trunk Assessment: Other exceptions Cervical / Trunk Exceptions: s/p surgery  Communication   Communication: No difficulties  Cognition Arousal/Alertness: Awake/alert Behavior During Therapy: WFL for tasks assessed/performed Overall Cognitive Status: Within Functional Limits for tasks assessed                                        General Comments      Exercises     Assessment/Plan    PT Assessment Patient needs continued PT services  PT Problem List Decreased strength;Decreased activity tolerance;Decreased balance;Decreased mobility;Decreased knowledge of use of DME;Decreased safety awareness;Decreased knowledge of precautions;Pain       PT Treatment Interventions DME instruction;Gait training;Stair training;Functional mobility training;Therapeutic activities;Therapeutic exercise;Neuromuscular re-education;Patient/family education    PT Goals (Current goals can be found in the Care Plan section)  Acute Rehab PT Goals Patient Stated Goal: Home today PT Goal Formulation: With patient Time For Goal Achievement: 06/30/19 Potential to Achieve Goals: Good    Frequency Min 5X/week   Barriers to discharge        Co-evaluation               AM-PAC PT "6 Clicks" Mobility  Outcome Measure Help needed turning from your back to your side while in a flat bed without using  bedrails?: None Help needed moving from lying on your back to sitting on the side of a flat bed without using bedrails?: A Little Help needed moving to and from a bed to a chair (including a wheelchair)?: A Little Help needed standing up from a chair using your arms (e.g., wheelchair or bedside chair)?: A Little Help needed to walk in hospital room?: A Little Help needed climbing 3-5 steps with a railing? : A Little 6 Click Score: 19    End of Session Equipment Utilized During Treatment: Gait belt;Back brace Activity Tolerance: Patient tolerated treatment well Patient left: in chair;with call bell/phone within reach Nurse Communication: Mobility status PT Visit Diagnosis: Unsteadiness on feet (R26.81);Pain;Other symptoms and signs involving the nervous system (R29.898) Pain - part of body: (back)    Time: 4098-1191 PT Time Calculation (min) (ACUTE ONLY): 40 min   Charges:   PT Evaluation $PT Eval Moderate Complexity: 1 Mod PT Treatments $Gait Training: 23-37 mins        Conni Slipper, PT, DPT Acute Rehabilitation Services Pager: [phone] Office: [phone]   [name] 06/23/2019, 11:09 AM

## 2019-06-23 NOTE — Discharge Instructions (Signed)

## 2019-06-23 NOTE — Plan of Care (Signed)
Patient alert and oriented, mae's well, voiding adequate amount of urine, swallowing without difficulty, no c/o pain at time of discharge. Patient discharged home with family. Script and discharged instructions given to patient. Patient and family stated understanding of instructions given. Patient has an appointment with Dr. Pool  

## 2019-06-23 NOTE — Discharge Summary (Signed)
Physician Discharge Summary  Patient ID: Emily Mcbride MRN: [id number] DOB/AGE: 1941/10/30 78 y.o.  Admit date: 06/22/2019 Discharge date: 06/23/2019  Admission Diagnoses:  Discharge Diagnoses:  Active Problems:   Degenerative spondylolisthesis   Discharged Condition: good  Hospital Course: Patient admitted to the hospital where she underwent uncomplicated I4-P8 decompression and fusion.  Postoperatively doing very well.  Preoperative back and lower extremity pain much improved.  Standing and walking better.  Voiding.  Ready for discharge home.  Consults:   Significant Diagnostic Studies:   Treatments:   Discharge Exam: Blood pressure (!) 121/52, pulse 86, temperature 99 F (37.2 C), temperature source Oral, resp. rate 18, height 5\' 2"  (1.575 m), weight 96.2 kg, SpO2 97 %. Awake and alert.  Oriented and appropriate.  Cranial nerve function intact.  Motor examination extremities with some stable mild dorsiflexion weakness on the right side.  Sensory examination with decrease sensation in her right L5 dermatome which is somewhat improved actually.  Wound clean and dry.  Chest and abdomen benign.  Disposition: Discharge disposition: 01-Home or Self Care        Allergies as of 06/23/2019      Reactions   Amaranth (fd&c Red #2) Shortness Of Breath   Sulfa Antibiotics Hives, Rash, Other (See Comments)   Blisters       Medication List    TAKE these medications   acetaminophen 650 MG CR tablet Commonly known as: TYLENOL Take 1,300 mg by mouth 2 (two) times daily.   aspirin EC 81 MG tablet Take 81 mg by mouth daily.   BLUE-EMU MAXIMUM STRENGTH EX Apply 1 application topically daily as needed (back pain).   calcium carbonate 1500 (600 Ca) MG Tabs tablet Commonly known as: OSCAL Take 600 mg of elemental calcium by mouth daily with breakfast.   cyclobenzaprine 10 MG tablet Commonly known as: FLEXERIL Take 1 tablet (10 mg total) by mouth 3 (three) times daily as  needed for muscle spasms.   gabapentin 300 MG capsule Commonly known as: NEURONTIN Take 300 mg by mouth 3 (three) times daily.   HYDROcodone-acetaminophen 10-325 MG tablet Commonly known as: NORCO Take 1 tablet by mouth every 4 (four) hours as needed for moderate pain ((score 4 to 6)).   meloxicam 15 MG tablet Commonly known as: MOBIC Take 15 mg by mouth daily.   multivitamin with minerals Tabs tablet Take 1 tablet by mouth daily.   nebivolol 10 MG tablet Commonly known as: BYSTOLIC Take 10 mg by mouth daily.   pregabalin 75 MG capsule Commonly known as: LYRICA Take 75 mg by mouth 3 (three) times daily.   sertraline 50 MG tablet Commonly known as: ZOLOFT Take 50 mg by mouth daily.   SYSTANE OP Place 1 drop into both eyes daily as needed (dry eyes).            Durable Medical Equipment  (From admission, onward)         Start     Ordered   06/22/19 1237  DME Walker rolling  Once    Question:  Patient needs a walker to treat with the following condition  Answer:  Degenerative spondylolisthesis   06/22/19 1236   06/22/19 1237  DME 3 n 1  Once     06/22/19 1236           Signed: Mallie Mussel A Raheen Capili 06/23/2019, 10:14 AM

## 2019-06-23 NOTE — Progress Notes (Signed)
OT Cancellation Note  Patient Details Name: Emily Mcbride MRN: [id number] DOB: 03-16-1941   Cancelled Treatment:    Reason Eval/Treat Not Completed: Other (comment). Pt currently eating breakfast.  [place], OTR/L Acute Rehab Services Pager [phone] Office [phone]     Almon Register 06/23/2019, 8:33 AM

## 2019-06-23 NOTE — Evaluation (Signed)
Occupational Therapy Evaluation and Discharge Patient Details Name: Emily Mcbride MRN: [id number] DOB: July 16, 1941 Today's Date: 06/23/2019    History of Present Illness Pt is a 78 y/o female who presents s/p L5-S1 PLIF on 06/22/2019. PMH significant for HTN, fibromyalgia, prior lumbar laminectomy/decompression x2.   Clinical Impression   This 78 yo female admitted and underwent above presents to acute OT with all education completed, we will D/C from acute OT.    Follow Up Recommendations  No OT follow up;Supervision/Assistance - 24 hour    Equipment Recommendations  None recommended by OT       Precautions / Restrictions Precautions Precautions: Back Precaution Booklet Issued: Yes (comment) Precaution Comments: Reviewed handout in detail and pt was cued for precautions during functional mobility.  Required Braces or Orthoses: Spinal Brace Spinal Brace: Applied in standing position;Applied in sitting position Restrictions Weight Bearing Restrictions: No      Mobility Bed Mobility               General bed mobility comments: Pt up in recliner upon my arrival  Transfers Overall transfer level: Needs assistance Equipment used: Rolling walker (2 wheeled) Transfers: Sit to/from Stand Sit to Stand: Min guard         General transfer comment: VCs for safe hand placement    Balance Overall balance assessment: Needs assistance Sitting-balance support: Feet supported;No upper extremity supported Sitting balance-Leahy Scale: Fair     Standing balance support: No upper extremity supported;During functional activity Standing balance-Leahy Scale: Fair                           ADL either performed or assessed with clinical judgement   ADL                                         General ADL Comments: Pt educated on LBD without bending (she can do all except tie shoes with use of AE--reports her husband will A tying shoes), educated on  not sitting for more than 20-30 minutes at a time, height of 3n1 that is best for her, use of wet wipes for back peri care, trying not to bend so much for peri care,  using two cups for brushing teeth. We discussed pt possibly getting tub transer bench due to pt reports it is hard for her to step into tub even with grab bars--pt shown picture of tub transfer bench and how it fits in tub.     Vision Patient Visual Report: No change from baseline              Pertinent Vitals/Pain Pain Assessment: 0-10 Pain Score: 3  Faces Pain Scale: Hurts a little bit Pain Location: Incision site Pain Descriptors / Indicators: Operative site guarding;Discomfort Pain Intervention(s): Limited activity within patient's tolerance;Monitored during session;Repositioned     Hand Dominance Right   Extremity/Trunk Assessment Upper Extremity Assessment Upper Extremity Assessment: Overall WFL for tasks assessed     Communication Communication Communication: No difficulties   Cognition Arousal/Alertness: Awake/alert Behavior During Therapy: WFL for tasks assessed/performed Overall Cognitive Status: Within Functional Limits for tasks assessed  Home Living Family/patient expects to be discharged to:: Private residence Living Arrangements: Spouse/significant other Available Help at Discharge: Available 24 hours/day;Family Type of Home: House Home Access: Stairs to enter [employer] of Steps: 2 Entrance Stairs-Rails: None Home Layout: Two level Alternate Level Stairs-Number of Steps: Consulting civil engineer Shower/Tub: Chief Strategy Officer: Standard     Home Equipment: Emergency planning/management officer - 2 wheels;Walker - 4 wheels;Adaptive equipment;Grab bars - tub/shower Adaptive Equipment: Reacher;Long-handled sponge;Sock aid Additional Comments: 1 grab bar in wall and 2  suction ones      Prior Functioning/Environment  Level of Independence: Needs assistance  Gait / Transfers Assistance Needed: Using rollator all the time prior to surgery ADL's / Homemaking Assistance Needed: reports independent with ADLs, limited IADLs            OT Problem List: Decreased range of motion;Obesity;Impaired balance (sitting and/or standing);Pain         OT Goals(Current goals can be found in the care plan section) Acute Rehab OT Goals Patient Stated Goal: Home today  OT Frequency:                AM-PAC OT "6 Clicks" Daily Activity     Outcome Measure Help from another person eating meals?: None Help from another person taking care of personal grooming?: None Help from another person toileting, which includes using toliet, bedpan, or urinal?: A Little Help from another person bathing (including washing, rinsing, drying)?: A Little Help from another person to put on and taking off regular upper body clothing?: None Help from another person to put on and taking off regular lower body clothing?: A Little 6 Click Score: 21   End of Session Equipment Utilized During Treatment: Rolling walker;Back brace Nurse Communication: (no further OT needs, pt needs husband's phone number written down for her)  Activity Tolerance: Patient tolerated treatment well Patient left: in chair;with call bell/phone within reach  OT Visit Diagnosis: Unsteadiness on feet (R26.81);Pain Pain - part of body: (incisional)                Time: 1610-9604 OT Time Calculation (min): 51 min Charges:  OT General Charges $OT Visit: 1 Visit OT Evaluation $OT Eval Moderate Complexity: 1 Mod OT Treatments $Self Care/Home Management : 23-37 mins  Ignacia Palma, OTR/L Acute [employer] Pager [phone] Office [phone]     Evette Georges 06/23/2019, 11:44 AM

## 2019-11-29 IMAGING — XA DG MYELOGRAPHY LUMBAR INJ LUMBOSACRAL
13 of 16 series · 13 of 16 positions shown · non-contrast
Comparison: Lumbar spine MRI 12/20/2018

CLINICAL DATA: Low back, right buttock, and right leg pain.
TECHNIQUE: Contiguous axial images were obtained through the Lumbar spine after
the intrathecal infusion of infusion. Coronal and sagittal
reconstructions were obtained of the axial image sets.

[Series 1: w lumbar spine lat · 0.15mm/px · 1 of 1 slices shown]
[im 1/1]
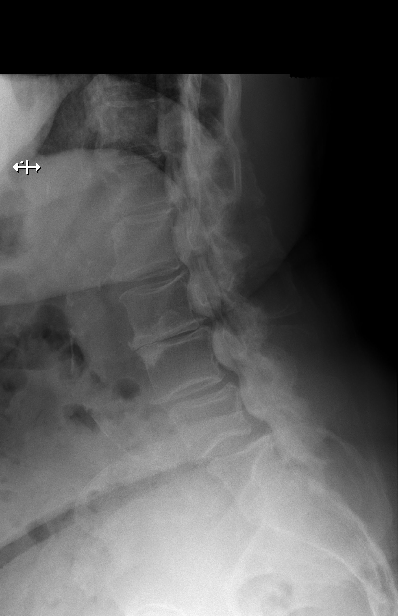

[Series 1: vasc standard · 1 of 1 slices shown (1 of 11)]
[im 1/1]
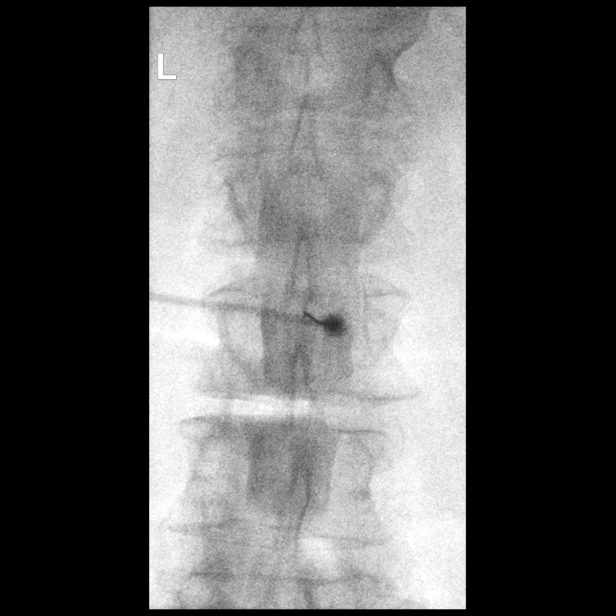

[Series 2: vasc standard · 1 of 1 slices shown (2 of 11)]
[im 1/1]
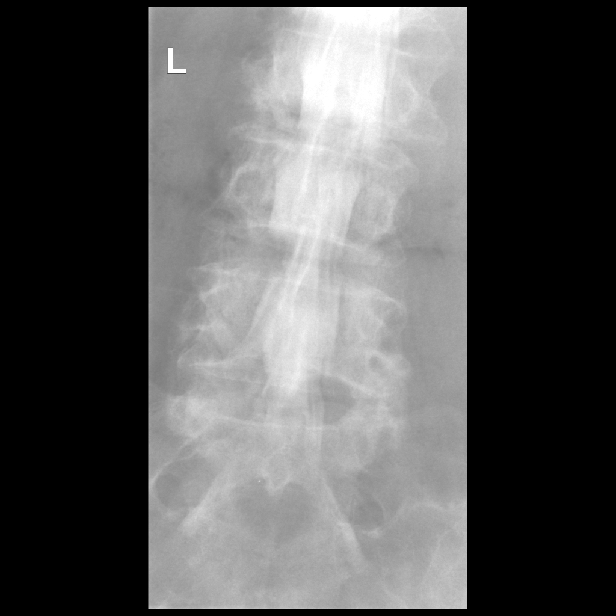

[Series 3: vasc standard · 1 of 1 slices shown (3 of 11)]
[im 1/1]
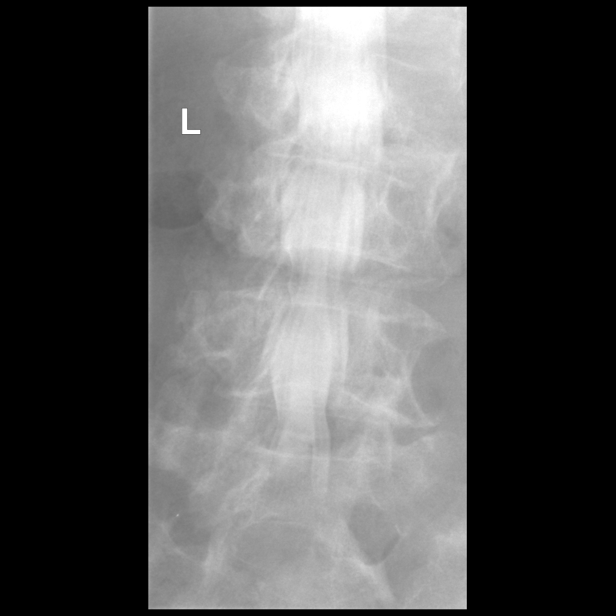

[Series 3: w lumbar spine extension · 0.15mm/px · 1 of 1 slices shown]
[im 1/1]
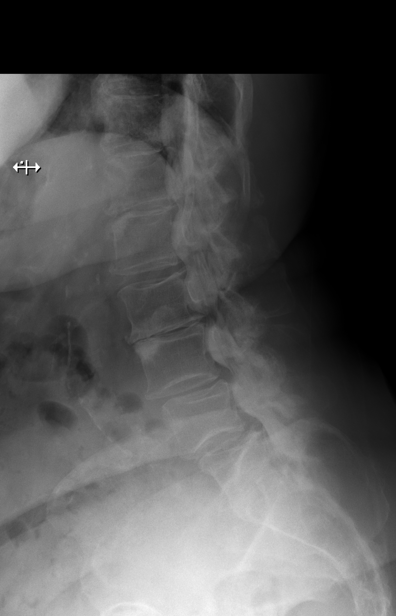

[Series 4: vasc standard · 1 of 1 slices shown (4 of 11)]
[im 1/1]
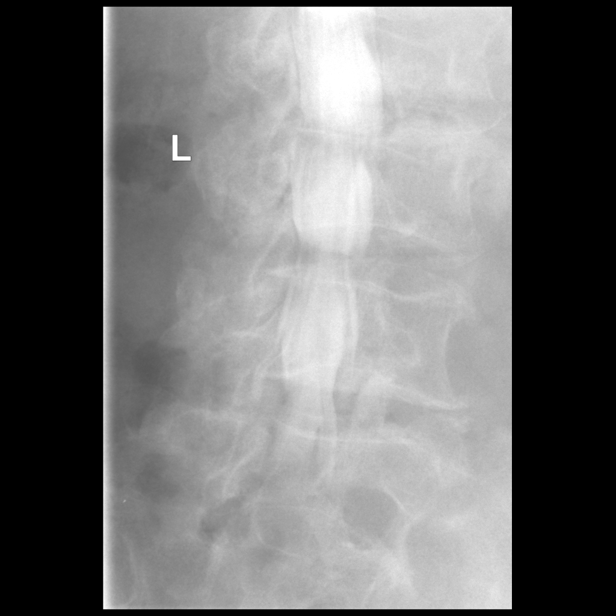

[Series 6: vasc standard · 1 of 1 slices shown (5 of 11)]
[im 1/1]
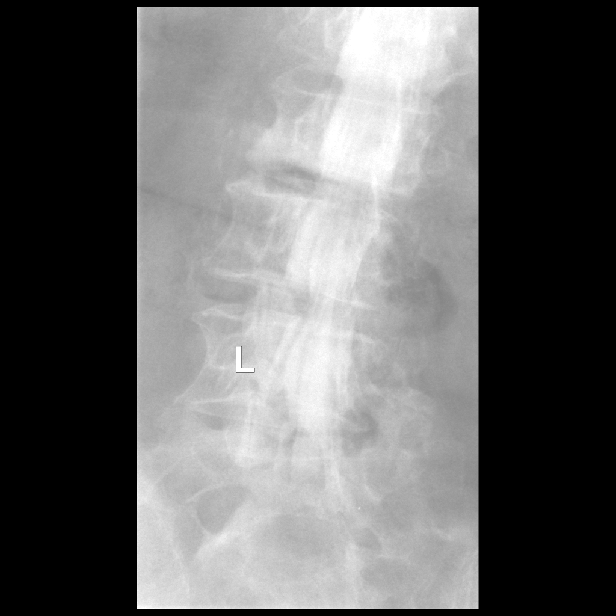

[Series 10: vasc standard · 1 of 1 slices shown (6 of 11)]
[im 1/1]
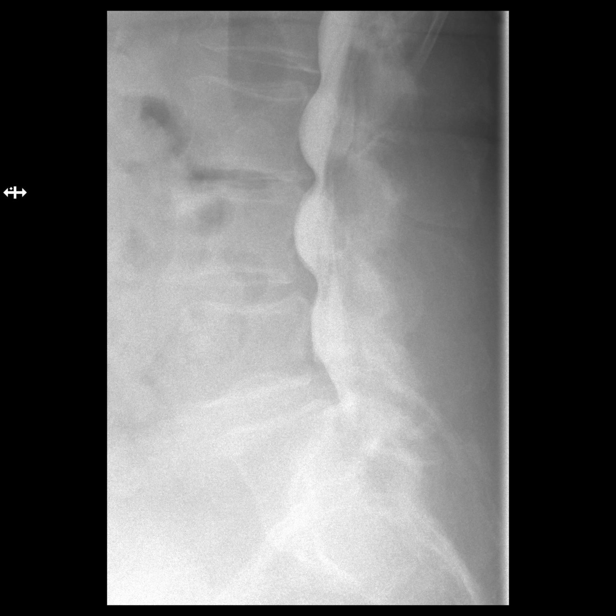

[Series 11: vasc standard · 1 of 1 slices shown (7 of 11)]
[im 1/1]
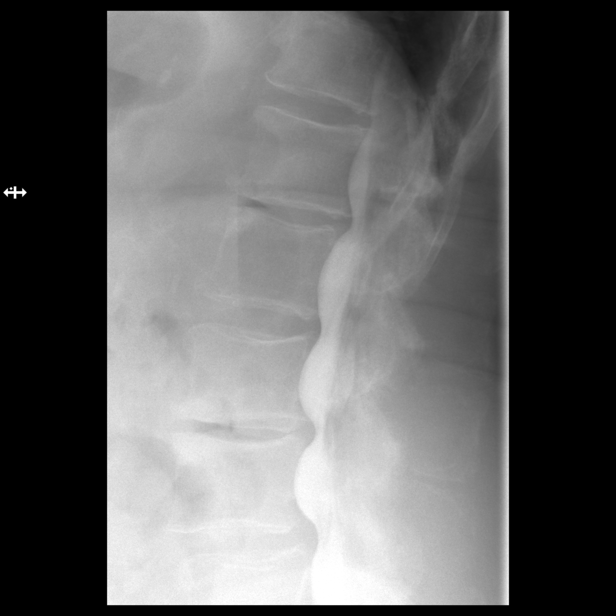

[Series 12: vasc standard · 1 of 1 slices shown (8 of 11)]
[im 1/1]
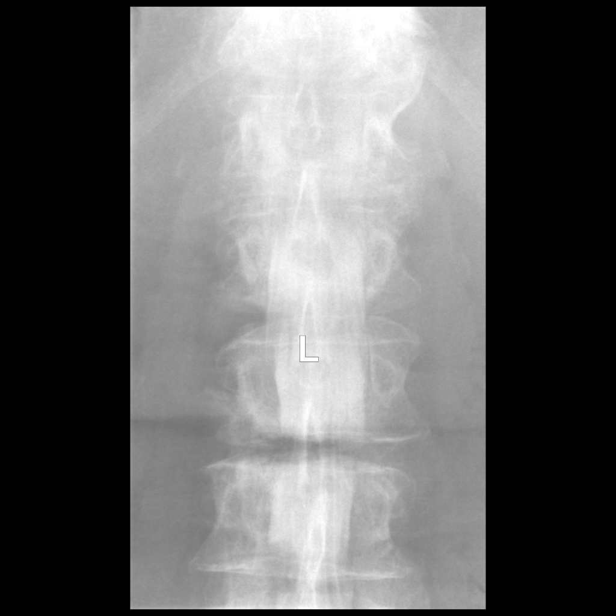

[Series 13: vasc standard · 1 of 1 slices shown (9 of 11)]
[im 1/1]
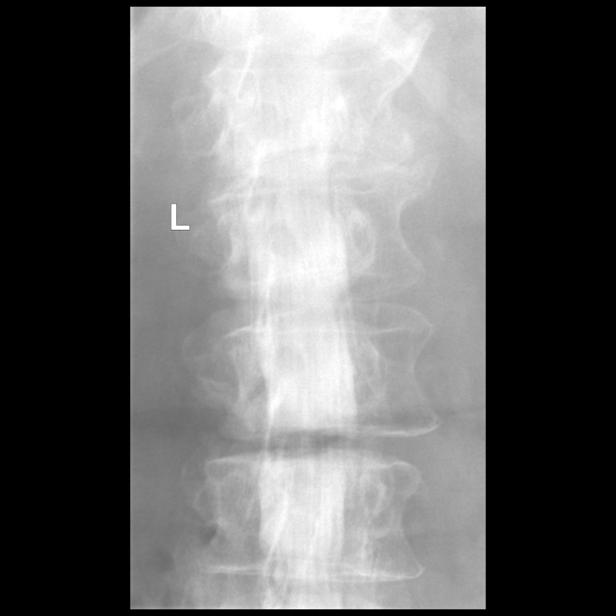

[Series 15: vasc standard · 1 of 1 slices shown (10 of 11)]
[im 1/1]
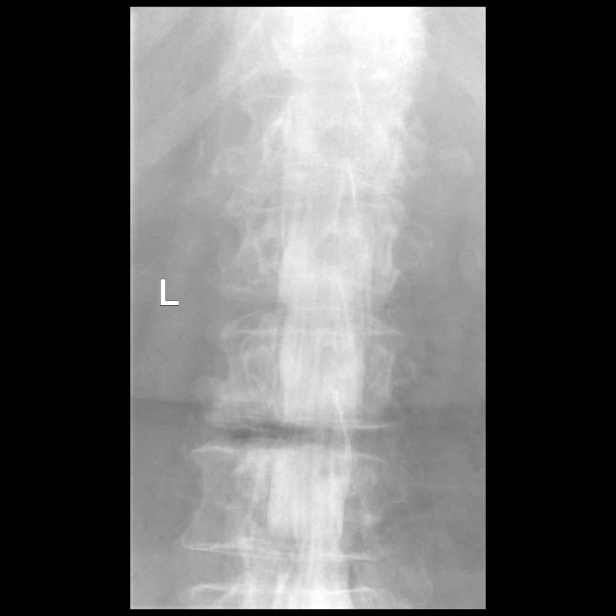

[Series 16: vasc standard · 1 of 1 slices shown (11 of 11)]
[im 1/1]
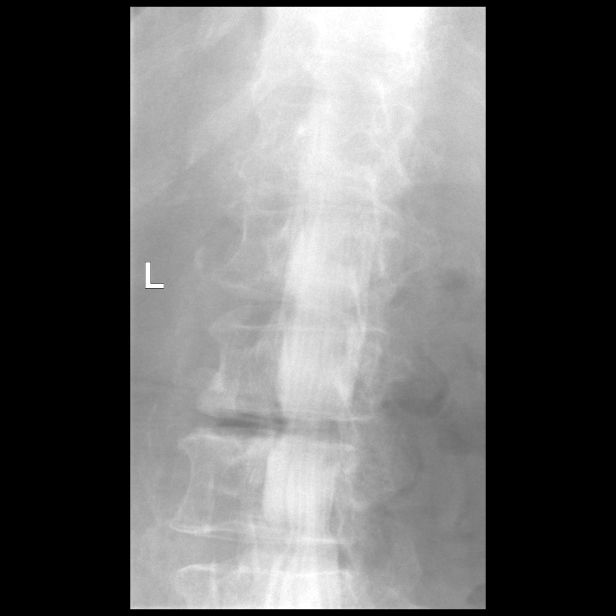

[13 of 16 positions shown; findings below may reference images not displayed]

EXAM:
LUMBAR MYELOGRAM

FLUOROSCOPY TIME:  Fluoroscopy Time: 60 seconds

Radiation Exposure Index: 368.79 microGray*m^2

PROCEDURE:
After thorough discussion of risks and benefits of the procedure
including bleeding, infection, injury to nerves, blood vessels,
adjacent structures as well as headache and CSF leak, written and
oral informed consent was obtained. Consent was obtained by Dr.
Promelus Ferjuste. Time out form was completed.

Patient was positioned prone on the fluoroscopy table. Local
anesthesia was provided with 1% lidocaine without epinephrine after
prepped and draped in the usual sterile fashion. Puncture was
performed at L2-3 using a 3 1/2 inch 22-gauge spinal needle via a
right interlaminar approach. Using a single pass through the dura,
the needle was placed within the thecal sac, with return of clear
CSF. 15 mL of Isovue C-0XX was injected into the thecal sac, with
normal opacification of the nerve roots and cauda equina consistent
with free flow within the subarachnoid space.

I personally performed the lumbar puncture and administered the
intrathecal contrast. I also personally supervised acquisition of
the myelogram images.
FINDINGS: LUMBAR MYELOGRAM FINDINGS:

There is partial lumbarization of S1 with a rudimentary S1-2 disc.
There is grade 1 anterolisthesis of L3 on L4, L4 on L5, and L5 on
S1, slightly more prominent with standing compared to prone
positioning. Range of motion is extremely limited on upright flexion
and extension radiographs without evidence of dynamic instability.
Ventral extradural defects are present throughout the lumbar spine
with likely moderate spinal stenosis at L3-4. Asymmetric left
lateral recess stenosis is present at L4-5.

CT LUMBAR MYELOGRAM FINDINGS:

Grade 1 anterolisthesis of T12 on L1, L3 on L4, L4 on L5, and L5 on
S1 is unchanged from the prior MRI. No fracture or suspicious
osseous lesion is identified. Vacuum disc is present at L1-2, L3-4,
and L5-S1. Disc space narrowing is moderate at L1-2 and L3-4 and
mild at L4-5. The conus medullaris terminates at L1.

Aortic atherosclerosis is noted with mild ectasia of the infrarenal
abdominal aorta measuring up to 2.5 cm in diameter and with central
displacement calcifications in the distal aorta which may reflect a
short segment dissection. Aortic diameter is similar to the prior
MRI. A 9 mm nonobstructing left renal calculus is noted.

T12-L1: Anterolisthesis with mild bulging of uncovered disc and
moderate to severe facet arthrosis without stenosis, unchanged.

L1-2: Disc bulging and mild facet hypertrophy without significant
stenosis, unchanged.

L2-3: Mild disc bulging and facet hypertrophy without stenosis,
unchanged.

L3-4: Circumferential disc bulging, a left foraminal disc extrusion,
ligamentum flavum hypertrophy, and mild-to-moderate right and severe
left facet hypertrophy result in moderate spinal stenosis, mild
bilateral lateral recess stenosis, and severe left neural foraminal
stenosis with potential left L3 nerve root impingement, unchanged.

L4-5 prior right laminectomy anterolisthesis with bulging uncovered
disc and severe facet hypertrophy result in moderate to severe left
lateral recess stenosis and moderate to severe left greater than
right neural foraminal stenosis with potential left L4 and L5 nerve
root impingement, unchanged.

L5-S1: Prior right laminectomy. Anterolisthesis with bulging
uncovered disc, a right foraminal to extraforaminal disc protrusion,
and severe facet hypertrophy result in moderate left lateral recess
stenosis and severe right and mild left neural foraminal stenosis
without significant generalized spinal stenosis, unchanged.
Potential right L5 and left S1 nerve root impingement.
IMPRESSION: 1. Unchanged lumbar disc and facet degeneration.
2. Moderate spinal stenosis and severe left neural foraminal
stenosis at L3-4.
3. Severe right neural foraminal and moderate left lateral recess
stenosis at L5-S1. Potential right L5 nerve root impingement.
4. Moderate to severe left lateral recess and left greater than
right neural foraminal stenosis at L4-5.
5. Nonobstructing left nephrolithiasis.
6. Ectatic abdominal aorta measuring up to 2.5 cm in diameter.
7.  Aortic Atherosclerosis ([id number]).

## 2019-12-10 IMAGING — RF DG C-ARM 61-120 MIN
1 series · 2 of 2 positions shown · non-contrast
Comparison: CT myelogram 06/11/2019.

CLINICAL DATA: L5-S1 PLIF for spondylolisthesis.

EXAM:
LUMBAR SPINE - 2-3 VIEW; DG C-ARM 61-120 MIN

[Series 1: run · 2 of 2 slices shown]
[im 1/2]
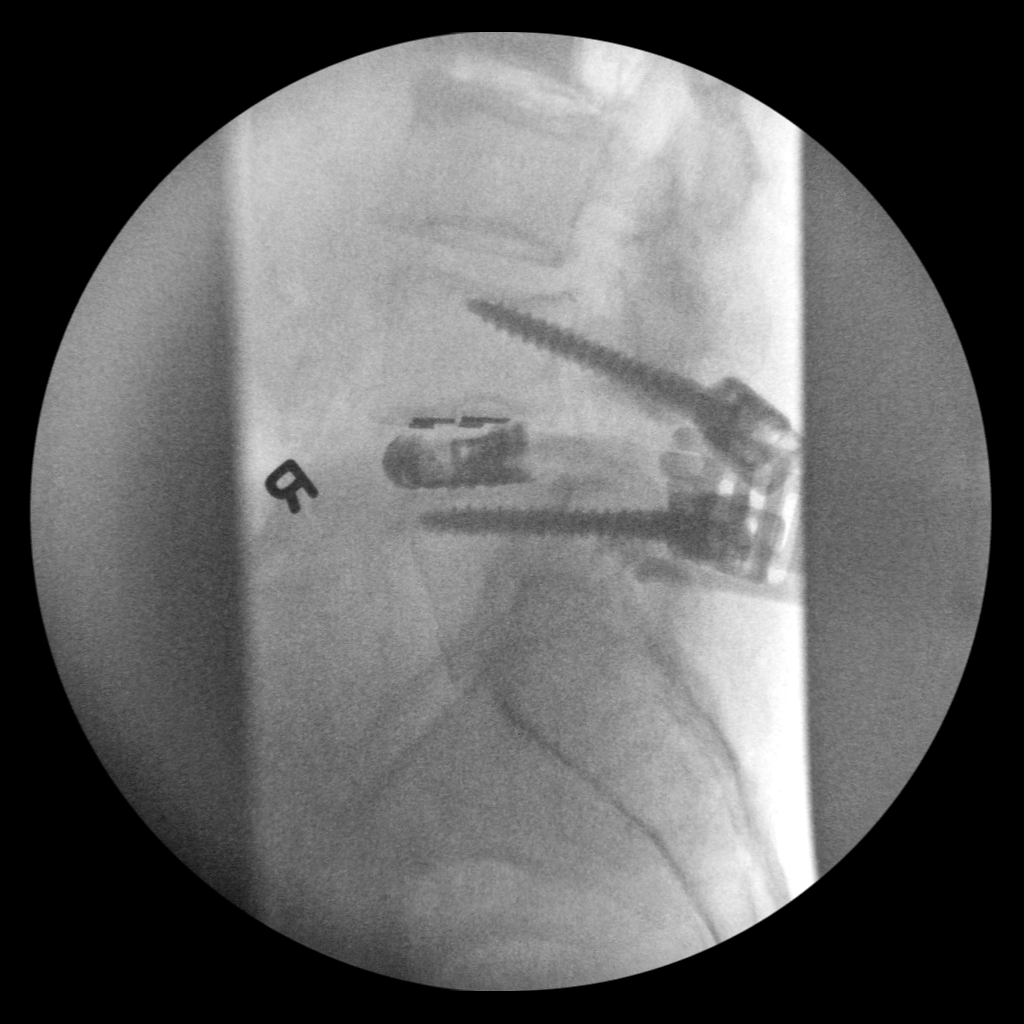
[im 2/2]
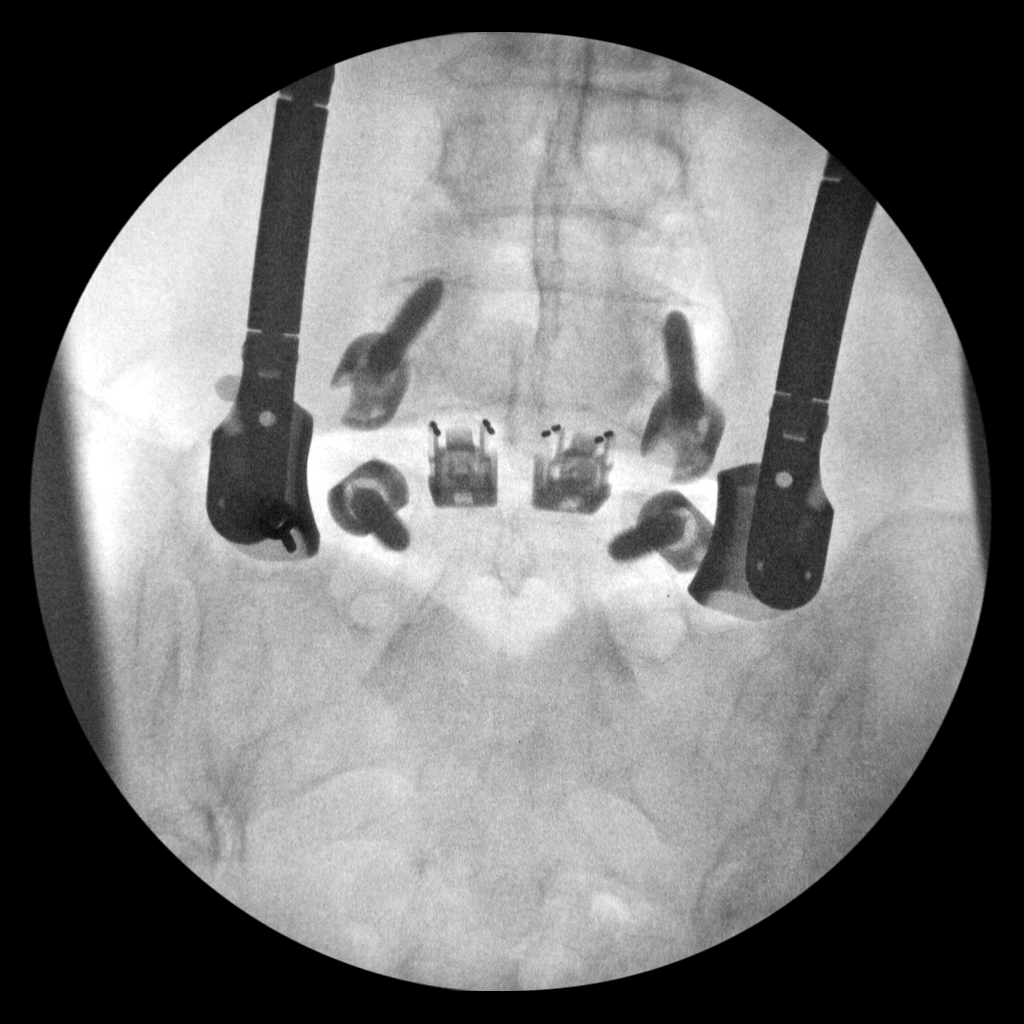

[2 of 2 positions shown; findings below may reference images not displayed]

FINDINGS: Spot PA and lateral fluoroscopic images of the lower lumbar spine
demonstrate a transitional S1 segment. Bilateral pedicle screws have
been placed at the L5 and S1 levels and appear well positioned. The
interconnecting rods have not yet been placed. Interbody spacers at
L5-S1 are well positioned.
IMPRESSION: Intraoperative views during L5-S1 PLIF. No demonstrated
complication.

## 2020-04-12 ENCOUNTER — Ambulatory Visit: Payer: Medicare PPO | Attending: Internal Medicine

## 2020-04-12 DIAGNOSIS — Z23 Encounter for immunization: Secondary | ICD-10-CM

## 2020-04-12 NOTE — Progress Notes (Signed)
Covid-19 Vaccination Clinic  Name:  Emily Mcbride    MRN: [id number] DOB: 07/30/1941  04/12/2020  Emily Mcbride was observed post Covid-19 immunization for 15 minutes without incident. She was provided with Vaccine Information Sheet and instruction to access the V-Safe system.   Emily Mcbride was instructed to call [phone] with any severe reactions post vaccine: Marland Kitchen Difficulty breathing  . Swelling of face and throat  . A fast heartbeat  . A bad rash all over body  . Dizziness and weakness   Immunizations Administered    Name Date Dose VIS Date Route   Moderna COVID-19 Vaccine 04/12/2020 10:18 AM 0.5 mL 12/01/2019 Intramuscular   Manufacturer: Moderna   Lot: [id number]   NDC: 29528-413-24

## 2021-07-06 ENCOUNTER — Other Ambulatory Visit: Payer: Self-pay

## 2021-07-06 ENCOUNTER — Ambulatory Visit: Payer: Medicare PPO | Attending: Pain Medicine

## 2021-07-06 VITALS — BP 183/84

## 2021-07-06 DIAGNOSIS — G8929 Other chronic pain: Secondary | ICD-10-CM | POA: Diagnosis present

## 2021-07-06 DIAGNOSIS — M6281 Muscle weakness (generalized): Secondary | ICD-10-CM | POA: Diagnosis present

## 2021-07-06 DIAGNOSIS — M545 Low back pain, unspecified: Secondary | ICD-10-CM | POA: Diagnosis present

## 2021-07-06 DIAGNOSIS — R293 Abnormal posture: Secondary | ICD-10-CM | POA: Insufficient documentation

## 2021-07-06 NOTE — Therapy (Addendum)
[facility] [address] [place], [place], [address] Phone: [phone]   Fax:  [phone]  Physical Therapy Evaluation  Patient Details  Name: Emily Mcbride MRN: [id number] Date of Birth: 02-Oct-1941 Referring Provider (PT): Jake Church, MD   Encounter Date: 07/06/2021    Past Medical History:  Diagnosis Date   Anemia    as a child   Arthritis    osteoarthritis   Chorioretinitis    Colitis    Fibromyalgia    GERD (gastroesophageal reflux disease)    no longer takes meds    Headache(784.0)    due to a fall several mos.   History of kidney stones    Hypertension    Rheumatic fever    as a child   Skipped heart beats    due to rheumatic fever as a child   Sleep apnea    does not use CPAP due discomfort    Past Surgical History:  Procedure Laterality Date   CARDIAC CATHETERIZATION     COLONOSCOPY     DILATION AND CURETTAGE OF UTERUS     LUMBAR LAMINECTOMY/DECOMPRESSION MICRODISCECTOMY Right 11/16/2013   Procedure: LUMBAR LAMINECTOMY/Laminotomy/DECOMPRESSION MICRODISCECTOMY RIGHT LUMBAR FOUR-FIVE;  Surgeon: Temple Pacini, MD;  Location: [facility];  Service: Neurosurgery;  Laterality: Right;  LUMBAR LAMINECTOMY/Laminotomy/DECOMPRESSION MICRODISCECTOMY RIGHT LUMBAR FOUR-FIVE   LUMBAR LAMINECTOMY/DECOMPRESSION MICRODISCECTOMY Right 10/09/2018   Procedure: LAMINECTOMY AND FORAMINOTOMY LUMBAR FIVE- SACRAL ONE;  Surgeon: Julio Sicks, MD;  Location: [facility];  Service: Neurosurgery;  Laterality: Right;  LAMINECTOMY AND FORAMINOTOMY LUMBAR FIVE- SACRAL ONE   TONSILLECTOMY       07/06/21 1434  Symptoms/Limitations  Subjective Patient reports that she has been having a lot of awful falls. If she bends her L knee she will fall forwad. She showd that she has fallen on her hands and reports still having grit and gravel at an [place] park. She states that holdig her hands over her head, she feels faint and  wants to pass out. She states that  she doen not typically walk with a cane anymore. She is consistent with receiving injections in the low back to reduce pain. However ,today the pain has increased yet again in her back. She is curious about having a stimultor implant into the spine to reduce the pain. Recent MD appt discussed her to next have an xray for the L knee as it tends to collapse under her. She reports she does not want to to have surgery at this time. She was seeing Marin Olp NP whio referred her to PT to encourage strength in the core and fall prevention. She has access to a gym facility but feels she anticipates having more benefit from PT. When she feels a strain in her back she begons to lean forward and has a hard time standing straight up. When she had previous injections, she felt the R leg has stayed very numb. She has had frequent UTIs but no other changes in bowel or bladder. Patient reports the numbness in her feet is limiting her walking and balance.  Pertinent History Fibromyalgia, Posterior rod and pedical screw fison L5-S2 with interdisc spacer, disc space narrowing and endplate sclerosis at L3-L4 and L1-L2  How long can you sit comfortably? according to the chair - straighback chair helps (1 - 3 hour) ; in a soft office chair 10 mins or less  How long can you stand comfortably? < 5 mins ; leans to do dishes or wash hands in  bathroom  How long can you walk comfortably? or less  Diagnostic tests xray 04/21/21 ; R convex scoliosis, 5 lumbar vertebrae , lateral alignment is normal .  Posterior rod and pedical screw fison L5-S2 with interdisc spacer, no perihardware lunency, vertebral body height are maintained, disc space narrowing and endplate sclerosis at L3-L4 and L1-L2  Patient Stated Goals to just be able t owalk without the pain and falling  Pain Assessment  Currently in Pain? Yes  Pain Score 7 (has eased off with sitting for a moment)  Pain Location Hip  Pain Descriptors / Indicators  Aching;Squeezing;Stabbing  Pain Type Chronic pain  Pain Radiating Towards L hip  Pain Onset More than a month ago      07/06/21 0230  Assessment  Medical Diagnosis Lumbar Radiculopathy  Referring Provider (PT) Jake Church, MD  Posture/Postural Control  Posture/Postural Control Postural limitations  Postural Limitations Flexed trunk;Weight shift left  Bed Mobility  Bed Mobility Left Sidelying to Sit;Right Sidelying to Sit  Balance  Balance Assessed Yes  Standardized Balance Assessment  Standardized Balance Assessment Five Times Sit to Stand (Rhomberg : no limitations)  Five times sit to stand comments  23sec       Objective measurements completed on examination: See above findings.     PT Long Term Goals - 07/06/21 1515       PT LONG TERM GOAL #1   Title Patient to be independent with bed mobility    Baseline pain with rolling - needs contact guard    Time 3    Period Weeks    Status New    Target Date 07/27/21      PT LONG TERM GOAL #2   Title Patient will be able to perform repeated sit to stand with minimal limitations in less than 20 sec to indicate improved freedom of mobility.    Baseline 26 sec; use of UE    Time 3    Period Weeks    Status New    Target Date 07/27/21      PT LONG TERM GOAL #3   Title Patient will be able to perform functional squat in order to prevent excessing trunk flexion and compensation with lifting.    Baseline hinges at the hips and minimally uses LE for lifting    Time 6    Period Weeks    Status New    Target Date 08/17/21                07/06/21 1545  Plan  Clinical Impression Statement Emily Mcbride is a pleasant 80 yo female presenting to OPPT wiuth a history of lumbar fusion, neuropathy, and chronic back pain. She has limitationg with indpeendent gait and ,mobility. She has had several recent falls and has been educated to continue with using a walking device in the community to prevent further injuries. Patient  demonstrates limited strenght and endurance for ADLs and maintaining quality of life. Skilled PT recommedned to address present limitations. Patient able to perform Rhomberg without significant sway or deviation  Personal Factors and Comorbidities Age;Comorbidity 1;Comorbidity 2;Time since onset of injury/illness/exacerbation;Past/Current Experience  Examination-Activity Limitations Bed Mobility;Bend;Lift;Hygiene/Grooming;Dressing;Stairs;Stand;Locomotion Level;Sleep;Transfers;Carry  Examination-Participation Restrictions Cleaning;Laundry;Meal Prep;Yard Work  Pt will benefit from skilled therapeutic intervention in order to improve on the following deficits Abnormal gait;Decreased activity tolerance;Decreased mobility;Difficulty walking;Increased muscle spasms;Decreased scar mobility;Decreased safety awareness;Pain;Obesity;Postural dysfunction;Impaired flexibility;Decreased balance;Decreased endurance;Decreased range of motion;Decreased strength;Hypomobility;Increased fascial restricitons  Stability/Clinical Decision Making Unstable/Unpredictable  Clinical Decision Making Low  Rehab Potential  Good  PT Frequency 2x / week  PT Duration 6 weeks  PT Treatment/Interventions ADLs/Self Care Home Management;Electrical Stimulation;Neuromuscular re-education;Manual techniques;Therapeutic exercise;Therapeutic activities;Moist Heat;Balance training;Gait training;Patient/family education  PT Next Visit Plan modalities as needed - progress to thoracolumbar mobility, gentle core strength, functional transfers  Consulted and Agree with Plan of Care Patient         Patient will benefit from skilled therapeutic intervention in order to improve the following deficits and impairments:  Abnormal gait, Decreased activity tolerance, Decreased mobility, Difficulty walking, Increased muscle spasms, Decreased scar mobility, Decreased safety awareness, Pain, Obesity, Postural dysfunction, Impaired flexibility, Decreased  balance, Decreased endurance, Decreased range of motion, Decreased strength, Hypomobility, Increased fascial restricitons  Visit Diagnosis: Chronic midline low back pain without sciatica - Plan: PT plan of care cert/re-cert  Abnormal posture - Plan: PT plan of care cert/re-cert  Muscle weakness (generalized) - Plan: PT plan of care cert/re-cert     Problem List Patient Active Problem List   Diagnosis Date Noted   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   Lumbar foraminal stenosis 10/09/2018   Spinal stenosis, lumbar region, with neurogenic claudication 11/16/2013    Levonne Spiller PT, DPT   [facility] [address] [place], [place], [address] Phone: [phone]   Fax:  [phone]  Name: Emily Mcbride MRN: [id number] Date of Birth: 1941/02/24

## 2021-07-10 ENCOUNTER — Other Ambulatory Visit: Payer: Self-pay

## 2021-07-10 ENCOUNTER — Ambulatory Visit: Payer: Medicare PPO

## 2021-07-10 DIAGNOSIS — M6281 Muscle weakness (generalized): Secondary | ICD-10-CM

## 2021-07-10 DIAGNOSIS — G8929 Other chronic pain: Secondary | ICD-10-CM

## 2021-07-10 DIAGNOSIS — R293 Abnormal posture: Secondary | ICD-10-CM

## 2021-07-10 DIAGNOSIS — M545 Low back pain, unspecified: Secondary | ICD-10-CM | POA: Diagnosis not present

## 2021-07-10 NOTE — Therapy (Signed)
[facility] [address] [place], [place], [address] Phone: [phone]   Fax:  [phone]  Physical Therapy Treatment  Patient Details  Name: Emily Mcbride MRN: [id number] Date of Birth: 05-21-1941 Referring Provider (PT): Jake Church, MD   Encounter Date: 07/10/2021   PT End of Session - 07/10/21 1521     Visit Number 2    Number of Visits 12    Date for PT Re-Evaluation 08/17/21    PT Start Time 1300    PT Stop Time 1345    PT Time Calculation (min) 45 min             Past Medical History:  Diagnosis Date   Anemia    as a child   Arthritis    osteoarthritis   Chorioretinitis    Colitis    Fibromyalgia    GERD (gastroesophageal reflux disease)    no longer takes meds    Headache(784.0)    due to a fall several mos.   History of kidney stones    Hypertension    Rheumatic fever    as a child   Skipped heart beats    due to rheumatic fever as a child   Sleep apnea    does not use CPAP due discomfort    Past Surgical History:  Procedure Laterality Date   CARDIAC CATHETERIZATION     COLONOSCOPY     DILATION AND CURETTAGE OF UTERUS     LUMBAR LAMINECTOMY/DECOMPRESSION MICRODISCECTOMY Right 11/16/2013   Procedure: LUMBAR LAMINECTOMY/Laminotomy/DECOMPRESSION MICRODISCECTOMY RIGHT LUMBAR FOUR-FIVE;  Surgeon: Temple Pacini, MD;  [facility]: [facility];  Service: Neurosurgery;  Laterality: Right;  LUMBAR LAMINECTOMY/Laminotomy/DECOMPRESSION MICRODISCECTOMY RIGHT LUMBAR FOUR-FIVE   LUMBAR LAMINECTOMY/DECOMPRESSION MICRODISCECTOMY Right 10/09/2018   Procedure: LAMINECTOMY AND FORAMINOTOMY LUMBAR FIVE- SACRAL ONE;  Surgeon: Julio Sicks, MD;  Location: [facility];  Service: Neurosurgery;  Laterality: Right;  LAMINECTOMY AND FORAMINOTOMY LUMBAR FIVE- SACRAL ONE   TONSILLECTOMY      There were no vitals filed for this visit.   Subjective Assessment - 07/10/21 1315     Subjective COVID-19 screening performed upon entry into  clinic. Patient states that she feels the top of her L hip is still about the same .She states it is a 4/10 but only because she is afraid to say 8/10 as someone once told her she should be going to the ER if that were the case. (she was informed and encouraged to answer per her own truth). She state that  she has been up and moving around a bit lately.    Pertinent History Fibromyalgia, Posterior rod and pedical screw fison L5-S2 with interdisc spacer, disc space narrowing and endplate sclerosis at L3-L4 and L1-L2    How long can you sit comfortably? according to the chair - straighback chair helps (1 - 3 hour) ; in a soft office chair 10 mins or less    How long can you stand comfortably? < 5 mins ; leans to do dishes or wash hands in bathroom    How long can you walk comfortably? or less    Diagnostic tests xray 04/21/21 ; R convex scoliosis, 5 lumbar vertebrae , lateral alignment is normal .  Posterior rod and pedical screw fison L5-S2 with interdisc spacer, no perihardware lunency, vertebral body height are maintained, disc space narrowing and endplate sclerosis at L3-L4 and L1-L2    Patient Stated Goals to just be able t owalk without the pain and falling  [facility] [address] [place], [place], [address] Phone: [phone]   Fax:  [phone]  Physical Therapy Treatment  Patient Details  Name: Emily Mcbride MRN: [id number] Date of Birth: 05-21-1941 Referring Provider (PT): Jake Church, MD   Encounter Date: 07/10/2021   PT End of Session - 07/10/21 1521     Visit Number 2    Number of Visits 12    Date for PT Re-Evaluation 08/17/21    PT Start Time 1300    PT Stop Time 1345    PT Time Calculation (min) 45 min             Past Medical History:  Diagnosis Date   Anemia    as a child   Arthritis    osteoarthritis   Chorioretinitis    Colitis    Fibromyalgia    GERD (gastroesophageal reflux disease)    no longer takes meds    Headache(784.0)    due to a fall several mos.   History of kidney stones    Hypertension    Rheumatic fever    as a child   Skipped heart beats    due to rheumatic fever as a child   Sleep apnea    does not use CPAP due discomfort    Past Surgical History:  Procedure Laterality Date   CARDIAC CATHETERIZATION     COLONOSCOPY     DILATION AND CURETTAGE OF UTERUS     LUMBAR LAMINECTOMY/DECOMPRESSION MICRODISCECTOMY Right 11/16/2013   Procedure: LUMBAR LAMINECTOMY/Laminotomy/DECOMPRESSION MICRODISCECTOMY RIGHT LUMBAR FOUR-FIVE;  Surgeon: Temple Pacini, MD;  [facility]: [facility];  Service: Neurosurgery;  Laterality: Right;  LUMBAR LAMINECTOMY/Laminotomy/DECOMPRESSION MICRODISCECTOMY RIGHT LUMBAR FOUR-FIVE   LUMBAR LAMINECTOMY/DECOMPRESSION MICRODISCECTOMY Right 10/09/2018   Procedure: LAMINECTOMY AND FORAMINOTOMY LUMBAR FIVE- SACRAL ONE;  Surgeon: Julio Sicks, MD;  Location: [facility];  Service: Neurosurgery;  Laterality: Right;  LAMINECTOMY AND FORAMINOTOMY LUMBAR FIVE- SACRAL ONE   TONSILLECTOMY      There were no vitals filed for this visit.   Subjective Assessment - 07/10/21 1315     Subjective COVID-19 screening performed upon entry into  clinic. Patient states that she feels the top of her L hip is still about the same .She states it is a 4/10 but only because she is afraid to say 8/10 as someone once told her she should be going to the ER if that were the case. (she was informed and encouraged to answer per her own truth). She state that  she has been up and moving around a bit lately.    Pertinent History Fibromyalgia, Posterior rod and pedical screw fison L5-S2 with interdisc spacer, disc space narrowing and endplate sclerosis at L3-L4 and L1-L2    How long can you sit comfortably? according to the chair - straighback chair helps (1 - 3 hour) ; in a soft office chair 10 mins or less    How long can you stand comfortably? < 5 mins ; leans to do dishes or wash hands in bathroom    How long can you walk comfortably? or less    Diagnostic tests xray 04/21/21 ; R convex scoliosis, 5 lumbar vertebrae , lateral alignment is normal .  Posterior rod and pedical screw fison L5-S2 with interdisc spacer, no perihardware lunency, vertebral body height are maintained, disc space narrowing and endplate sclerosis at L3-L4 and L1-L2    Patient Stated Goals to just be able t owalk without the pain and falling  to thoracolumbar mobility, gentle core strength, functional transfers    Consulted and Agree with Plan of Care Patient             Patient will benefit from skilled therapeutic intervention in order to improve the following deficits and impairments:  Abnormal gait, Decreased activity tolerance, Decreased mobility, Difficulty walking, Increased muscle spasms, Decreased scar mobility, Decreased safety awareness, Pain, Obesity, Postural dysfunction, Impaired flexibility, Decreased balance, Decreased endurance, Decreased range of motion, Decreased strength, Hypomobility, Increased fascial restricitons  Visit Diagnosis: Chronic midline low back pain without sciatica  Abnormal posture  Muscle weakness (generalized)     Problem List Patient Active Problem List   Diagnosis Date Noted   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   Lumbar foraminal stenosis 10/09/2018   Spinal stenosis, lumbar region, with neurogenic claudication 11/16/2013    Levonne Spiller 07/10/2021, 3:25 PM  [facility] [address] [place], [place], [address] Phone: [phone]   Fax:  [phone]  Name: Emily Mcbride MRN: [id number] Date of Birth: January 28, 1941

## 2021-07-13 ENCOUNTER — Ambulatory Visit: Payer: Medicare PPO | Admitting: Physical Therapy

## 2021-07-13 ENCOUNTER — Encounter: Payer: Self-pay | Admitting: Physical Therapy

## 2021-07-13 ENCOUNTER — Other Ambulatory Visit: Payer: Self-pay

## 2021-07-13 DIAGNOSIS — R293 Abnormal posture: Secondary | ICD-10-CM

## 2021-07-13 DIAGNOSIS — M545 Low back pain, unspecified: Secondary | ICD-10-CM | POA: Diagnosis not present

## 2021-07-13 DIAGNOSIS — M6281 Muscle weakness (generalized): Secondary | ICD-10-CM

## 2021-07-13 DIAGNOSIS — G8929 Other chronic pain: Secondary | ICD-10-CM

## 2021-07-13 NOTE — Therapy (Signed)
PT Frequency 2x / week    PT Duration 6 weeks    PT Treatment/Interventions ADLs/Self Care Home Management;Electrical Stimulation;Neuromuscular re-education;Manual techniques;Therapeutic exercise;Therapeutic activities;Moist Heat;Balance training;Gait training;Patient/family education    PT Next Visit Plan modalities as needed - progress to thoracolumbar mobility, gentle core strength, functional transfers    Consulted and Agree with Plan of Care Patient             Patient will benefit from skilled therapeutic intervention in order to improve the following deficits and impairments:  Abnormal gait, Decreased activity tolerance, Decreased mobility, Difficulty walking, Increased muscle spasms, Decreased scar mobility, Decreased safety awareness, Pain, Obesity, Postural dysfunction, Impaired flexibility, Decreased balance, Decreased endurance, Decreased range of motion, Decreased strength, Hypomobility, Increased fascial restricitons  Visit Diagnosis: Chronic midline low back pain without sciatica  Abnormal posture  Muscle weakness (generalized)     Problem List Patient Active Problem List   Diagnosis Date Noted   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   Lumbar foraminal stenosis 10/09/2018   Spinal stenosis, lumbar region, with neurogenic claudication 11/16/2013    Marvell Fuller, PTA 07/13/2021, 4:10 PM  [facility] [address] [place], [place], [address] Phone: [phone]   Fax:  [phone]  Name: Emily Mcbride MRN: [id number] Date of Birth: 02/27/1941  PT Frequency 2x / week    PT Duration 6 weeks    PT Treatment/Interventions ADLs/Self Care Home Management;Electrical Stimulation;Neuromuscular re-education;Manual techniques;Therapeutic exercise;Therapeutic activities;Moist Heat;Balance training;Gait training;Patient/family education    PT Next Visit Plan modalities as needed - progress to thoracolumbar mobility, gentle core strength, functional transfers    Consulted and Agree with Plan of Care Patient             Patient will benefit from skilled therapeutic intervention in order to improve the following deficits and impairments:  Abnormal gait, Decreased activity tolerance, Decreased mobility, Difficulty walking, Increased muscle spasms, Decreased scar mobility, Decreased safety awareness, Pain, Obesity, Postural dysfunction, Impaired flexibility, Decreased balance, Decreased endurance, Decreased range of motion, Decreased strength, Hypomobility, Increased fascial restricitons  Visit Diagnosis: Chronic midline low back pain without sciatica  Abnormal posture  Muscle weakness (generalized)     Problem List Patient Active Problem List   Diagnosis Date Noted   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   Lumbar foraminal stenosis 10/09/2018   Spinal stenosis, lumbar region, with neurogenic claudication 11/16/2013    Marvell Fuller, PTA 07/13/2021, 4:10 PM  [facility] [address] [place], [place], [address] Phone: [phone]   Fax:  [phone]  Name: Emily Mcbride MRN: [id number] Date of Birth: 02/27/1941  [facility] [address] [place], [place], [address] Phone: [phone]   Fax:  [phone]  Physical Therapy Treatment  Patient Details  Name: Emily Mcbride MRN: [id number] Date of Birth: 1941/12/02 Referring Provider (PT): Jake Church, MD   Encounter Date: 07/13/2021   PT End of Session - 07/13/21 1403     Visit Number 3    Number of Visits 12    Date for PT Re-Evaluation 08/17/21    PT Start Time 1302    PT Stop Time 1345    PT Time Calculation (min) 43 min    Activity Tolerance Patient tolerated treatment well    Behavior During Therapy [facility] for tasks assessed/performed             Past Medical History:  Diagnosis Date   Anemia    as a child   Arthritis    osteoarthritis   Chorioretinitis    Colitis    Fibromyalgia    GERD (gastroesophageal reflux disease)    no longer takes meds    Headache(784.0)    due to a fall several mos.   History of kidney stones    Hypertension    Rheumatic fever    as a child   Skipped heart beats    due to rheumatic fever as a child   Sleep apnea    does not use CPAP due discomfort    Past Surgical History:  Procedure Laterality Date   CARDIAC CATHETERIZATION     COLONOSCOPY     DILATION AND CURETTAGE OF UTERUS     LUMBAR LAMINECTOMY/DECOMPRESSION MICRODISCECTOMY Right 11/16/2013   Procedure: LUMBAR LAMINECTOMY/Laminotomy/DECOMPRESSION MICRODISCECTOMY RIGHT LUMBAR FOUR-FIVE;  Surgeon: Temple Pacini, MD;  [facility]: [facility];  Service: Neurosurgery;  Laterality: Right;  LUMBAR LAMINECTOMY/Laminotomy/DECOMPRESSION MICRODISCECTOMY RIGHT LUMBAR FOUR-FIVE   LUMBAR LAMINECTOMY/DECOMPRESSION MICRODISCECTOMY Right 10/09/2018   Procedure: LAMINECTOMY AND FORAMINOTOMY LUMBAR FIVE- SACRAL ONE;  Surgeon: Julio Sicks, MD;  Location: [facility];  Service: Neurosurgery;  Laterality: Right;  LAMINECTOMY AND FORAMINOTOMY LUMBAR FIVE- SACRAL ONE   TONSILLECTOMY      There were no vitals filed for  this visit.   Subjective Assessment - 07/13/21 1300     Subjective COVID-19 screening performed upon entry into clinic. Reports that she was picking beans and standing up to cook them and has had more pain in L low back and into L thigh which the muscle feel strained.    Pertinent History Fibromyalgia, Posterior rod and pedical screw fison L5-S2 with interdisc spacer, disc space narrowing and endplate sclerosis at L3-L4 and L1-L2    How long can you sit comfortably? according to the chair - straighback chair helps (1 - 3 hour) ; in a soft office chair 10 mins or less    How long can you stand comfortably? < 5 mins ; leans to do dishes or wash hands in bathroom    How long can you walk comfortably? or less    Diagnostic tests xray 04/21/21 ; R convex scoliosis, 5 lumbar vertebrae , lateral alignment is normal .  Posterior rod and pedical screw fison L5-S2 with interdisc spacer, no perihardware lunency, vertebral body height are maintained, disc space narrowing and endplate sclerosis at L3-L4 and L1-L2    Patient Stated Goals to just be able t owalk without the pain and falling    Currently in Pain? Yes    Pain Score 2     Pain Location Back    Pain Orientation

## 2021-07-17 ENCOUNTER — Ambulatory Visit: Payer: Medicare PPO | Admitting: Physical Therapy

## 2021-07-17 ENCOUNTER — Encounter: Payer: Self-pay | Admitting: Physical Therapy

## 2021-07-17 ENCOUNTER — Other Ambulatory Visit: Payer: Self-pay

## 2021-07-17 DIAGNOSIS — M545 Low back pain, unspecified: Secondary | ICD-10-CM | POA: Diagnosis not present

## 2021-07-17 DIAGNOSIS — G8929 Other chronic pain: Secondary | ICD-10-CM

## 2021-07-17 DIAGNOSIS — R293 Abnormal posture: Secondary | ICD-10-CM

## 2021-07-17 DIAGNOSIS — M6281 Muscle weakness (generalized): Secondary | ICD-10-CM

## 2021-07-17 NOTE — Therapy (Signed)
[facility] [address] [place], [place], [address] Phone: [phone]   Fax:  [phone]  Physical Therapy Treatment  Patient Details  Name: Emily Mcbride MRN: [id number] Date of Birth: 1941-02-16 Referring Provider (PT): Jake Church, MD   Encounter Date: 07/17/2021   PT End of Session - 07/17/21 1310     Visit Number 4    Number of Visits 12    Date for PT Re-Evaluation 08/17/21    PT Start Time 1303    PT Stop Time 1346    PT Time Calculation (min) 43 min    Activity Tolerance Patient tolerated treatment well    Behavior During Therapy [facility] for tasks assessed/performed             Past Medical History:  Diagnosis Date   Anemia    as a child   Arthritis    osteoarthritis   Chorioretinitis    Colitis    Fibromyalgia    GERD (gastroesophageal reflux disease)    no longer takes meds    Headache(784.0)    due to a fall several mos.   History of kidney stones    Hypertension    Rheumatic fever    as a child   Skipped heart beats    due to rheumatic fever as a child   Sleep apnea    does not use CPAP due discomfort    Past Surgical History:  Procedure Laterality Date   CARDIAC CATHETERIZATION     COLONOSCOPY     DILATION AND CURETTAGE OF UTERUS     LUMBAR LAMINECTOMY/DECOMPRESSION MICRODISCECTOMY Right 11/16/2013   Procedure: LUMBAR LAMINECTOMY/Laminotomy/DECOMPRESSION MICRODISCECTOMY RIGHT LUMBAR FOUR-FIVE;  Surgeon: Temple Pacini, MD;  Location: [facility];  Service: Neurosurgery;  Laterality: Right;  LUMBAR LAMINECTOMY/Laminotomy/DECOMPRESSION MICRODISCECTOMY RIGHT LUMBAR FOUR-FIVE   LUMBAR LAMINECTOMY/DECOMPRESSION MICRODISCECTOMY Right 10/09/2018   Procedure: LAMINECTOMY AND FORAMINOTOMY LUMBAR FIVE- SACRAL ONE;  Surgeon: Julio Sicks, MD;  Location: [facility];  Service: Neurosurgery;  Laterality: Right;  LAMINECTOMY AND FORAMINOTOMY LUMBAR FIVE- SACRAL ONE   TONSILLECTOMY      There were no vitals filed for  this visit.   Subjective Assessment - 07/17/21 1303     Subjective COVID-19 screening performed upon entry into clinic. Reports continued LBP and L hip. Had a muscle knot come up in L thigh but states that is where the pain radiates.    Pertinent History Fibromyalgia, Posterior rod and pedical screw fison L5-S2 with interdisc spacer, disc space narrowing and endplate sclerosis at L3-L4 and L1-L2    How long can you sit comfortably? according to the chair - straighback chair helps (1 - 3 hour) ; in a soft office chair 10 mins or less    How long can you stand comfortably? < 5 mins ; leans to do dishes or wash hands in bathroom    How long can you walk comfortably? or less    Diagnostic tests xray 04/21/21 ; R convex scoliosis, 5 lumbar vertebrae , lateral alignment is normal .  Posterior rod and pedical screw fison L5-S2 with interdisc spacer, no perihardware lunency, vertebral body height are maintained, disc space narrowing and endplate sclerosis at L3-L4 and L1-L2    Patient Stated Goals to just be able t owalk without the pain and falling    Currently in Pain? Yes    Pain Score --   no pain score provided   Pain Location Back    Pain Orientation Left;Lower  Pain Descriptors / Indicators Discomfort    Pain Type Chronic pain    Pain Radiating Towards L hip    Pain Onset More than a month ago    Pain Frequency Constant                [facility] PT Assessment - 07/17/21 0001       Assessment   Medical Diagnosis Lumbar Radiculopathy    Referring Provider (PT) Jake Church, MD                           [facility] Adult PT Treatment/Exercise - 07/17/21 0001       Lumbar Exercises: Stretches   Active Hamstring Stretch Left;3 reps;30 seconds    Single Knee to Chest Stretch Left;3 reps;30 seconds    Figure 4 Stretch 3 reps;30 seconds;Supine;Without overpressure      Lumbar Exercises: Aerobic   Nustep L2 x16 min      Modalities   Modalities Risk analyst Location L low back    Electrical Stimulation Action Pre-Mod    Electrical Stimulation Parameters 80-150 hz x10 min    Electrical Stimulation Goals Pain                         PT Long Term Goals - 07/06/21 1515       PT LONG TERM GOAL #1   Title Patient to be independent with bed mobility    Baseline pain with rolling - needs contact guard    Time 3    Period Weeks    Status New    Target Date 07/27/21      PT LONG TERM GOAL #2   Title Patient will be able to perform repeated sit to stand with minimal limitations in less than 20 sec to indicate improved freedom of mobility.    Baseline 26 sec; use of UE    Time 3    Period Weeks    Status New    Target Date 07/27/21      PT LONG TERM GOAL #3   Title Patient will be able to perform functional squat in order to prevent excessing trunk flexion and compensation with lifting.    Baseline hinges at the hips and minimally uses LE for lifting    Time 6    Period Weeks    Status New    Target Date 08/17/21                   Plan - 07/17/21 1456     Clinical Impression Statement Patient presented in clinic with continued L LBP and L hip pain. Patient introduced to light aerobic warm up to promote greater mobility. Passive hip and low back stretching continued in supine. Min-mod muscle tightness palpable in proximal L quad where patient reported feeling a "knot" was present. Normal stimulation response noted following removal of the modality.    Personal Factors and Comorbidities Age;Comorbidity 1;Comorbidity 2;Time since onset of injury/illness/exacerbation;Past/Current Experience    Examination-Activity Limitations Bed Mobility;Bend;Lift;Hygiene/Grooming;Dressing;Stairs;Stand;Locomotion Level;Sleep;Transfers;Carry    Examination-Participation Restrictions Cleaning;Laundry;Meal Prep;Yard Work    Stability/Clinical Decision Making Unstable/Unpredictable     Rehab Potential Good    PT Frequency 2x / week    PT Duration 6 weeks    PT Treatment/Interventions ADLs/Self Care Home Management;Electrical Stimulation;Neuromuscular re-education;Manual techniques;Therapeutic exercise;Therapeutic activities;Moist Heat;Balance training;Gait training;Patient/family education  PT Next Visit Plan modalities as needed - progress to thoracolumbar mobility, gentle core strength, functional transfers    Consulted and Agree with Plan of Care Patient             Patient will benefit from skilled therapeutic intervention in order to improve the following deficits and impairments:  Abnormal gait, Decreased activity tolerance, Decreased mobility, Difficulty walking, Increased muscle spasms, Decreased scar mobility, Decreased safety awareness, Pain, Obesity, Postural dysfunction, Impaired flexibility, Decreased balance, Decreased endurance, Decreased range of motion, Decreased strength, Hypomobility, Increased fascial restricitons  Visit Diagnosis: Chronic midline low back pain without sciatica  Abnormal posture  Muscle weakness (generalized)     Problem List Patient Active Problem List   Diagnosis Date Noted   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   Lumbar foraminal stenosis 10/09/2018   Spinal stenosis, lumbar region, with neurogenic claudication 11/16/2013    Marvell Fuller, PTA 07/17/2021, 3:06 PM  [facility] [address] [place], [place], [address] Phone: [phone]   Fax:  [phone]  Name: Emily Mcbride MRN: [id number] Date of Birth: 15-Dec-1941

## 2021-07-20 ENCOUNTER — Ambulatory Visit: Payer: Medicare PPO

## 2021-07-20 ENCOUNTER — Other Ambulatory Visit: Payer: Self-pay

## 2021-07-20 DIAGNOSIS — G8929 Other chronic pain: Secondary | ICD-10-CM

## 2021-07-20 DIAGNOSIS — M6281 Muscle weakness (generalized): Secondary | ICD-10-CM

## 2021-07-20 DIAGNOSIS — M545 Low back pain, unspecified: Secondary | ICD-10-CM | POA: Diagnosis not present

## 2021-07-20 DIAGNOSIS — R293 Abnormal posture: Secondary | ICD-10-CM

## 2021-07-20 NOTE — Therapy (Signed)
Abnormal gait, Decreased activity tolerance, Decreased mobility, Difficulty walking, Increased muscle spasms, Decreased scar mobility, Decreased safety awareness, Pain, Obesity, Postural dysfunction, Impaired flexibility, Decreased balance, Decreased endurance, Decreased range of motion, Decreased strength, Hypomobility, Increased fascial restricitons  Visit Diagnosis: Chronic midline low back pain without sciatica  Abnormal posture  Muscle weakness (generalized)     Problem List Patient Active Problem List   Diagnosis Date Noted   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   Lumbar foraminal stenosis 10/09/2018   Spinal stenosis, lumbar region, with neurogenic claudication 11/16/2013    Levonne Mcbride PT, DPT 07/20/2021, 3:57 PM  [facility] [address] [place], [place], [address] Phone: [phone]   Fax:  [phone]  Name: [name] MRN: [id number] Date of Birth: June 02, 1941  [facility] [address] [place], [place], [address] Phone: [phone]   Fax:  [phone]  Physical Therapy Treatment  Patient Details  Name: Emily Mcbride MRN: [id number] Date of Birth: 1941-01-19 Referring Provider (PT): Jake Church, MD   Encounter Date: 07/20/2021   PT End of Session - 07/20/21 1410     Visit Number 5    Number of Visits 12    Date for PT Re-Evaluation 08/17/21    PT Start Time 1345             Past Medical History:  Diagnosis Date   Anemia    as a child   Arthritis    osteoarthritis   Chorioretinitis    Colitis    Fibromyalgia    GERD (gastroesophageal reflux disease)    no longer takes meds    Headache(784.0)    due to a fall several mos.   History of kidney stones    Hypertension    Rheumatic fever    as a child   Skipped heart beats    due to rheumatic fever as a child   Sleep apnea    does not use CPAP due discomfort    Past Surgical History:  Procedure Laterality Date   CARDIAC CATHETERIZATION     COLONOSCOPY     DILATION AND CURETTAGE OF UTERUS     LUMBAR LAMINECTOMY/DECOMPRESSION MICRODISCECTOMY Right 11/16/2013   Procedure: LUMBAR LAMINECTOMY/Laminotomy/DECOMPRESSION MICRODISCECTOMY RIGHT LUMBAR FOUR-FIVE;  Surgeon: Temple Pacini, MD;  [facility];  Service: Neurosurgery;  Laterality: Right;  LUMBAR LAMINECTOMY/Laminotomy/DECOMPRESSION MICRODISCECTOMY RIGHT LUMBAR FOUR-FIVE   LUMBAR LAMINECTOMY/DECOMPRESSION MICRODISCECTOMY Right 10/09/2018   Procedure: LAMINECTOMY AND FORAMINOTOMY LUMBAR FIVE- SACRAL ONE;  Surgeon: Julio Sicks, MD;  Location: [facility];  Service: Neurosurgery;  Laterality: Right;  LAMINECTOMY AND FORAMINOTOMY LUMBAR FIVE- SACRAL ONE   TONSILLECTOMY      There were no vitals filed for this visit.   Subjective Assessment - 07/20/21 1357     Subjective COVID-19 screening performed upon entry into clinic. Reports continued LBP and L hip pain as she was  pulling corn. She states that the best she could do was lay flat and feel a little better.    Pertinent History Fibromyalgia, Posterior rod and pedical screw fison L5-S2 with interdisc spacer, disc space narrowing and endplate sclerosis at L3-L4 and L1-L2    How long can you sit comfortably? according to the chair - straighback chair helps (1 - 3 hour) ; in a soft office chair 10 mins or less    How long can you stand comfortably? < 5 mins ; leans to do dishes or wash hands in bathroom    How long can you walk comfortably? or less    Diagnostic tests xray 04/21/21 ; R convex scoliosis, 5 lumbar vertebrae , lateral alignment is normal .  Posterior rod and pedical screw fison L5-S2 with interdisc spacer, no perihardware lunency, vertebral body height are maintained, disc space narrowing and endplate sclerosis at L3-L4 and L1-L2    Patient Stated Goals to just be able to walk without the pain and falling    Currently in Pain? Yes    Pain Score 8     Pain Location Back    Pain Orientation Left    Pain Descriptors / Indicators Aching    Pain Onset More than a month ago    Pain Frequency Constant  [facility] [address] [place], [place], [address] Phone: [phone]   Fax:  [phone]  Physical Therapy Treatment  Patient Details  Name: Emily Mcbride MRN: [id number] Date of Birth: 1941-01-19 Referring Provider (PT): Jake Church, MD   Encounter Date: 07/20/2021   PT End of Session - 07/20/21 1410     Visit Number 5    Number of Visits 12    Date for PT Re-Evaluation 08/17/21    PT Start Time 1345             Past Medical History:  Diagnosis Date   Anemia    as a child   Arthritis    osteoarthritis   Chorioretinitis    Colitis    Fibromyalgia    GERD (gastroesophageal reflux disease)    no longer takes meds    Headache(784.0)    due to a fall several mos.   History of kidney stones    Hypertension    Rheumatic fever    as a child   Skipped heart beats    due to rheumatic fever as a child   Sleep apnea    does not use CPAP due discomfort    Past Surgical History:  Procedure Laterality Date   CARDIAC CATHETERIZATION     COLONOSCOPY     DILATION AND CURETTAGE OF UTERUS     LUMBAR LAMINECTOMY/DECOMPRESSION MICRODISCECTOMY Right 11/16/2013   Procedure: LUMBAR LAMINECTOMY/Laminotomy/DECOMPRESSION MICRODISCECTOMY RIGHT LUMBAR FOUR-FIVE;  Surgeon: Temple Pacini, MD;  [facility]: [facility];  Service: Neurosurgery;  Laterality: Right;  LUMBAR LAMINECTOMY/Laminotomy/DECOMPRESSION MICRODISCECTOMY RIGHT LUMBAR FOUR-FIVE   LUMBAR LAMINECTOMY/DECOMPRESSION MICRODISCECTOMY Right 10/09/2018   Procedure: LAMINECTOMY AND FORAMINOTOMY LUMBAR FIVE- SACRAL ONE;  Surgeon: Julio Sicks, MD;  Location: [facility];  Service: Neurosurgery;  Laterality: Right;  LAMINECTOMY AND FORAMINOTOMY LUMBAR FIVE- SACRAL ONE   TONSILLECTOMY      There were no vitals filed for this visit.   Subjective Assessment - 07/20/21 1357     Subjective COVID-19 screening performed upon entry into clinic. Reports continued LBP and L hip pain as she was  pulling corn. She states that the best she could do was lay flat and feel a little better.    Pertinent History Fibromyalgia, Posterior rod and pedical screw fison L5-S2 with interdisc spacer, disc space narrowing and endplate sclerosis at L3-L4 and L1-L2    How long can you sit comfortably? according to the chair - straighback chair helps (1 - 3 hour) ; in a soft office chair 10 mins or less    How long can you stand comfortably? < 5 mins ; leans to do dishes or wash hands in bathroom    How long can you walk comfortably? or less    Diagnostic tests xray 04/21/21 ; R convex scoliosis, 5 lumbar vertebrae , lateral alignment is normal .  Posterior rod and pedical screw fison L5-S2 with interdisc spacer, no perihardware lunency, vertebral body height are maintained, disc space narrowing and endplate sclerosis at L3-L4 and L1-L2    Patient Stated Goals to just be able to walk without the pain and falling    Currently in Pain? Yes    Pain Score 8     Pain Location Back    Pain Orientation Left    Pain Descriptors / Indicators Aching    Pain Onset More than a month ago    Pain Frequency Constant

## 2021-07-24 ENCOUNTER — Other Ambulatory Visit: Payer: Self-pay

## 2021-07-24 ENCOUNTER — Ambulatory Visit: Payer: Medicare PPO | Admitting: Physical Therapy

## 2021-07-24 DIAGNOSIS — G8929 Other chronic pain: Secondary | ICD-10-CM

## 2021-07-24 DIAGNOSIS — R293 Abnormal posture: Secondary | ICD-10-CM

## 2021-07-24 DIAGNOSIS — M6281 Muscle weakness (generalized): Secondary | ICD-10-CM

## 2021-07-24 DIAGNOSIS — M545 Low back pain, unspecified: Secondary | ICD-10-CM

## 2021-07-24 NOTE — Therapy (Signed)
[facility] [address] [place], [place], [address] Phone: [phone]   Fax:  [phone]  Physical Therapy Treatment  Patient Details  Name: Emily Mcbride MRN: [id number] Date of Birth: [birth year] Referring Provider (PT): Jake Church, MD   Encounter Date: 07/24/2021   PT End of Session - 07/24/21 1309     Visit Number 6    Number of Visits 12    Date for PT Re-Evaluation 08/17/21    PT Start Time 0105    PT Stop Time 0200    PT Time Calculation (min) 55 min    Activity Tolerance Patient tolerated treatment well    Behavior During Therapy [facility] for tasks assessed/performed             Past Medical History:  Diagnosis Date   Anemia    as a child   Arthritis    osteoarthritis   Chorioretinitis    Colitis    Fibromyalgia    GERD (gastroesophageal reflux disease)    no longer takes meds    Headache(784.0)    due to a fall several mos.   History of kidney stones    Hypertension    Rheumatic fever    as a child   Skipped heart beats    due to rheumatic fever as a child   Sleep apnea    does not use CPAP due discomfort    Past Surgical History:  Procedure Laterality Date   CARDIAC CATHETERIZATION     COLONOSCOPY     DILATION AND CURETTAGE OF UTERUS     LUMBAR LAMINECTOMY/DECOMPRESSION MICRODISCECTOMY Right 11/16/2013   Procedure: LUMBAR LAMINECTOMY/Laminotomy/DECOMPRESSION MICRODISCECTOMY RIGHT LUMBAR FOUR-FIVE;  Surgeon: Temple Pacini, MD;  [facility];  Service: Neurosurgery;  Laterality: Right;  LUMBAR LAMINECTOMY/Laminotomy/DECOMPRESSION MICRODISCECTOMY RIGHT LUMBAR FOUR-FIVE   LUMBAR LAMINECTOMY/DECOMPRESSION MICRODISCECTOMY Right 10/09/2018   Procedure: LAMINECTOMY AND FORAMINOTOMY LUMBAR FIVE- SACRAL ONE;  Surgeon: Julio Sicks, MD;  Location: [facility];  Service: Neurosurgery;  Laterality: Right;  LAMINECTOMY AND FORAMINOTOMY LUMBAR FIVE- SACRAL ONE   TONSILLECTOMY      There were no vitals filed for  this visit.   Subjective Assessment - 07/24/21 1308     Subjective COVID-19 screening performed upon entry into clinic. Patient arrived with ongoing back pain and left hip.    Pertinent History Fibromyalgia, Posterior rod and pedical screw fison L5-S2 with interdisc spacer, disc space narrowing and endplate sclerosis at L3-L4 and L1-L2    How long can you sit comfortably? according to the chair - straighback chair helps (1 - 3 hour) ; in a soft office chair 10 mins or less    How long can you stand comfortably? < 5 mins ; leans to do dishes or wash hands in bathroom    Diagnostic tests xray 04/21/21 ; R convex scoliosis, 5 lumbar vertebrae , lateral alignment is normal .  Posterior rod and pedical screw fison L5-S2 with interdisc spacer, no perihardware lunency, vertebral body height are maintained, disc space narrowing and endplate sclerosis at L3-L4 and L1-L2    Patient Stated Goals to just be able to walk without the pain and falling    Currently in Pain? Yes    Pain Score 6     Pain Location Back    Pain Orientation Left    Pain Descriptors / Indicators Discomfort    Pain Radiating Towards left hip    Pain Onset More than a month ago    Pain Frequency Constant  PT Next Visit Plan modalities as needed - progress to thoracolumbar mobility, gentle core strength, functional transfers    Consulted and Agree with Plan of Care Patient             Patient will benefit from skilled therapeutic intervention in order to improve the following deficits and impairments:  Abnormal gait, Decreased activity tolerance, Decreased mobility, Difficulty walking, Increased muscle spasms, Decreased scar mobility, Decreased safety awareness, Pain, Obesity, Postural dysfunction, Impaired flexibility, Decreased balance, Decreased endurance, Decreased range of motion, Decreased strength, Hypomobility, Increased fascial restricitons  Visit Diagnosis: Chronic midline low back pain without sciatica  Abnormal posture  Muscle weakness (generalized)     Problem List Patient Active Problem List   Diagnosis Date Noted   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   Lumbar foraminal stenosis 10/09/2018   Spinal stenosis, lumbar region, with neurogenic claudication 11/16/2013    Hermelinda Dellen, PTA 07/24/2021, 2:04 PM  [facility] [address] [place], [place], [address] Phone: [phone]   Fax:  [phone]  Name: Emily Mcbride MRN: [id number] Date of Birth: Jan 09, 1941  [facility] [address] [place], [place], [address] Phone: [phone]   Fax:  [phone]  Physical Therapy Treatment  Patient Details  Name: Emily Mcbride MRN: [id number] Date of Birth: [birth year] Referring Provider (PT): Jake Church, MD   Encounter Date: 07/24/2021   PT End of Session - 07/24/21 1309     Visit Number 6    Number of Visits 12    Date for PT Re-Evaluation 08/17/21    PT Start Time 0105    PT Stop Time 0200    PT Time Calculation (min) 55 min    Activity Tolerance Patient tolerated treatment well    Behavior During Therapy [facility] for tasks assessed/performed             Past Medical History:  Diagnosis Date   Anemia    as a child   Arthritis    osteoarthritis   Chorioretinitis    Colitis    Fibromyalgia    GERD (gastroesophageal reflux disease)    no longer takes meds    Headache(784.0)    due to a fall several mos.   History of kidney stones    Hypertension    Rheumatic fever    as a child   Skipped heart beats    due to rheumatic fever as a child   Sleep apnea    does not use CPAP due discomfort    Past Surgical History:  Procedure Laterality Date   CARDIAC CATHETERIZATION     COLONOSCOPY     DILATION AND CURETTAGE OF UTERUS     LUMBAR LAMINECTOMY/DECOMPRESSION MICRODISCECTOMY Right 11/16/2013   Procedure: LUMBAR LAMINECTOMY/Laminotomy/DECOMPRESSION MICRODISCECTOMY RIGHT LUMBAR FOUR-FIVE;  Surgeon: Temple Pacini, MD;  [facility]: [facility];  Service: Neurosurgery;  Laterality: Right;  LUMBAR LAMINECTOMY/Laminotomy/DECOMPRESSION MICRODISCECTOMY RIGHT LUMBAR FOUR-FIVE   LUMBAR LAMINECTOMY/DECOMPRESSION MICRODISCECTOMY Right 10/09/2018   Procedure: LAMINECTOMY AND FORAMINOTOMY LUMBAR FIVE- SACRAL ONE;  Surgeon: Julio Sicks, MD;  Location: [facility];  Service: Neurosurgery;  Laterality: Right;  LAMINECTOMY AND FORAMINOTOMY LUMBAR FIVE- SACRAL ONE   TONSILLECTOMY      There were no vitals filed for  this visit.   Subjective Assessment - 07/24/21 1308     Subjective COVID-19 screening performed upon entry into clinic. Patient arrived with ongoing back pain and left hip.    Pertinent History Fibromyalgia, Posterior rod and pedical screw fison L5-S2 with interdisc spacer, disc space narrowing and endplate sclerosis at L3-L4 and L1-L2    How long can you sit comfortably? according to the chair - straighback chair helps (1 - 3 hour) ; in a soft office chair 10 mins or less    How long can you stand comfortably? < 5 mins ; leans to do dishes or wash hands in bathroom    Diagnostic tests xray 04/21/21 ; R convex scoliosis, 5 lumbar vertebrae , lateral alignment is normal .  Posterior rod and pedical screw fison L5-S2 with interdisc spacer, no perihardware lunency, vertebral body height are maintained, disc space narrowing and endplate sclerosis at L3-L4 and L1-L2    Patient Stated Goals to just be able to walk without the pain and falling    Currently in Pain? Yes    Pain Score 6     Pain Location Back    Pain Orientation Left    Pain Descriptors / Indicators Discomfort    Pain Radiating Towards left hip    Pain Onset More than a month ago    Pain Frequency Constant

## 2021-07-24 NOTE — Patient Instructions (Signed)
Pelvic Tilt: Posterior - Legs Bent (Supine)  Tighten stomach and flatten back by rolling pelvis down and tighten buttock muscles.  Hold _10___ seconds. Relax. Repeat _10-30___ times per set. Do __2__ sets per session. Do _2___ sessions per day. Pelvic Tilt: Posterior - Legs Bent (Supine)  Bent Leg Lift (Hook-Lying)  Tighten stomach and buttock and slowly raise right leg _5___ inches from floor. Keep trunk rigid. Hold _3___ seconds. Repeat _10___ times per set. Do ___2-3_ sets per session. Do __2__ sessions per day.   Heel Raise (Sitting)    Raise heels, keeping toes on floor.  Tighten abdomin and buttock (draw in) Repeat __10__ times per set. Do __1-2__ sets per session. Do __1-2__ sessions per day.   FLEXION: Sitting (Active)    Sit, both feet flat. Lift right knee toward ceiling. Tighten abdomin and buttock (draw in) Complete _10__ sets of _1-2__ repetitions. Perform _1-2__ sessions per day.  Brushing Teeth    Place one foot on ledge and one hand on counter. Bend other knee slightly to keep back straight.  Copyright  VHI. All rights reserved.  Refrigerator   Squat with knees apart to reach lower shelves and drawers.   Copyright  VHI. All rights reserved.  Laundry [employer] down and hold basket close to stand. Use leg muscles to do the work.   Copyright  VHI. All rights reserved.  Housework - Vacuuming   Hold the vacuum with arm held at side. Step back and forth to move it, keeping head up. Avoid twisting.   Copyright  VHI. All rights reserved.  Housework - Wiping   Position yourself as close as possible to reach work surface. Avoid straining your back.   Copyright  VHI. All rights reserved.  Gardening - Mowing   Keep arms close to sides and walk with lawn mower.   Copyright  VHI. All rights reserved.  Sleeping on Side   Place pillow between knees. Use cervical support under neck and a roll around waist as needed.   Copyright   VHI. All rights reserved.  Log Roll   Lying on back, bend left knee and place left arm across chest. Roll all in one movement to the right. Reverse to roll to the left. Always move as one unit.   Copyright  VHI. All rights reserved.  Stand to Sit / Sit to Stand   To sit: Bend knees to lower self onto front edge of chair, then scoot back on seat. To stand: Reverse sequence by placing one foot forward, and scoot to front of seat. Use rocking motion to stand up.  Copyright  VHI. All rights reserved.  Posture - Standing   Good posture is important. Avoid slouching and forward head thrust. Maintain curve in low back and align ears over shoul- ders, hips over ankles.   Copyright  VHI. All rights reserved.  Posture - Sitting   Sit upright, head facing forward. Try using a roll to support lower back. Keep shoulders relaxed, and avoid rounded back. Keep hips level with knees. Avoid crossing legs for long periods.   Copyright  VHI. All rights reserved.  Computer Work   Position work to Art gallery manager. Use proper work and seat height. Keep shoulders back and down, wrists straight, and elbows at right angles. Use chair that provides full back support. Add footrest and lumbar roll as needed.   Copyright  VHI. All rights reserved.

## 2021-07-27 ENCOUNTER — Other Ambulatory Visit: Payer: Self-pay

## 2021-07-27 ENCOUNTER — Ambulatory Visit: Payer: Medicare PPO

## 2021-07-27 DIAGNOSIS — R293 Abnormal posture: Secondary | ICD-10-CM

## 2021-07-27 DIAGNOSIS — G8929 Other chronic pain: Secondary | ICD-10-CM

## 2021-07-27 DIAGNOSIS — M545 Low back pain, unspecified: Secondary | ICD-10-CM | POA: Diagnosis not present

## 2021-07-27 DIAGNOSIS — M6281 Muscle weakness (generalized): Secondary | ICD-10-CM

## 2021-07-27 NOTE — Therapy (Signed)
[facility] [address] [place], [place], [address] Phone: [phone]   Fax:  [phone]  Physical Therapy Treatment  Patient Details  Name: Emily Mcbride MRN: [id number] Date of Birth: [birth year] Referring Provider (PT): Jake Church, MD   Encounter Date: 07/27/2021   PT End of Session - 07/27/21 1309     Visit Number 7    Number of Visits 12    Date for PT Re-Evaluation 08/17/21    PT Start Time 1300    PT Stop Time 1400    PT Time Calculation (min) 60 min    Activity Tolerance Patient tolerated treatment well    Behavior During Therapy [facility] for tasks assessed/performed             Past Medical History:  Diagnosis Date   Anemia    as a child   Arthritis    osteoarthritis   Chorioretinitis    Colitis    Fibromyalgia    GERD (gastroesophageal reflux disease)    no longer takes meds    Headache(784.0)    due to a fall several mos.   History of kidney stones    Hypertension    Rheumatic fever    as a child   Skipped heart beats    due to rheumatic fever as a child   Sleep apnea    does not use CPAP due discomfort    Past Surgical History:  Procedure Laterality Date   CARDIAC CATHETERIZATION     COLONOSCOPY     DILATION AND CURETTAGE OF UTERUS     LUMBAR LAMINECTOMY/DECOMPRESSION MICRODISCECTOMY Right 11/16/2013   Procedure: LUMBAR LAMINECTOMY/Laminotomy/DECOMPRESSION MICRODISCECTOMY RIGHT LUMBAR FOUR-FIVE;  Surgeon: Temple Pacini, MD;  Location: [facility];  Service: Neurosurgery;  Laterality: Right;  LUMBAR LAMINECTOMY/Laminotomy/DECOMPRESSION MICRODISCECTOMY RIGHT LUMBAR FOUR-FIVE   LUMBAR LAMINECTOMY/DECOMPRESSION MICRODISCECTOMY Right 10/09/2018   Procedure: LAMINECTOMY AND FORAMINOTOMY LUMBAR FIVE- SACRAL ONE;  Surgeon: Julio Sicks, MD;  Location: [facility] OR;  Service: Neurosurgery;  Laterality: Right;  LAMINECTOMY AND FORAMINOTOMY LUMBAR FIVE- SACRAL ONE   TONSILLECTOMY      There were no vitals filed for  this visit.   Subjective Assessment - 07/27/21 1309     Subjective COVID-19 screening performed upon entry into clinic. Patient with increased pain at the R hip and gluteal area. Patient states that she has been doing some gardening in the house and that may be the reason why. She has not been consistent with her HEP she states.    Pertinent History Fibromyalgia, Posterior rod and pedical screw fison L5-S2 with interdisc spacer, disc space narrowing and endplate sclerosis at L3-L4 and L1-L2    How long can you sit comfortably? according to the chair - straighback chair helps (1 - 3 hour) ; in a soft office chair 10 mins or less    How long can you stand comfortably? < 5 mins ; leans to do dishes or wash hands in bathroom    How long can you walk comfortably? or less    Diagnostic tests xray 04/21/21 ; R convex scoliosis, 5 lumbar vertebrae , lateral alignment is normal .  Posterior rod and pedical screw fison L5-S2 with interdisc spacer, no perihardware lunency, vertebral body height are maintained, disc space narrowing and endplate sclerosis at L3-L4 and L1-L2    Patient Stated Goals to just be able to walk without the pain and falling    Currently in Pain? Yes    Pain Score 7  Pain Location Back    Pain Orientation Left;Right    Pain Descriptors / Indicators Other (Comment);Aching;Discomfort    Pain Radiating Towards bilateral hip    Pain Onset More than a month ago                [facility] PT Assessment - 07/27/21 0001       Assessment   Medical Diagnosis Lumbar Radiculopathy    Referring Provider (PT) Jake Church, MD      Posture/Postural Control   Posture/Postural Control Postural limitations    Postural Limitations Flexed trunk;Weight shift left                           [facility] Adult PT Treatment/Exercise - 07/27/21 0001       Exercises   Exercises Lumbar;Other Exercises      Lumbar Exercises: Stretches   Active Hamstring Stretch Left;3 reps;30  seconds    Single Knee to Chest Stretch Left;Right;3 reps;30 seconds    Double Knee to Chest Stretch 60 seconds    Double Knee to Chest Stretch Limitations feet on physioball    Lower Trunk Rotation 2 reps;60 seconds    Piriformis Stretch 20 seconds;3 reps      Lumbar Exercises: Aerobic   Nustep L3 x82min abdominal bracing focus      Lumbar Exercises: Supine   Bridge with [employer] 15 reps    Bridge with [employer] Limitations unable to cleear rear from table                         PT Long Term Goals - 07/24/21 1310       PT LONG TERM GOAL #1   Title Patient to be independent with bed mobility    Baseline performed today with educational cues for technique/will need to review another session 07/24/21    Time 3    Period Weeks    Status On-going      PT LONG TERM GOAL #2   Title Patient will be able to perform repeated sit to stand with minimal limitations in less than 20 sec to indicate improved freedom of mobility.    Baseline 26 sec; use of UE    Time 3    Period Weeks    Status On-going      PT LONG TERM GOAL #3   Title Patient will be able to perform functional squat in order to prevent excessing trunk flexion and compensation with lifting.    Baseline hinges at the hips and minimally uses LE for lifting    Time 6    Period Weeks    Status On-going                   Plan - 07/27/21 1331     Clinical Impression Statement Emily Mcbride demonstrates fair tolerance to PT this visit. She demonstrates moderate limitations with bed mobility but only needing verbal cues to encourage log rolling to perform supine to sit. Progression of glute and core strength this session encouraged by initiating hip bridges. Limited progression made otherwaise due to patient concerns with discussing family relations - non PT related. Time of treatment does not include time utilized not performing activities.    Personal Factors and Comorbidities Age;Comorbidity  1;Comorbidity 2;Time since onset of injury/illness/exacerbation;Past/Current Experience    Examination-Activity Limitations Bed Mobility;Bend;Lift;Hygiene/Grooming;Dressing;Stairs;Stand;Locomotion Level;Sleep;Transfers;Carry    Examination-Participation Restrictions Cleaning;Laundry;Meal Prep;Yard Work    Stability/Clinical  Decision Making Unstable/Unpredictable    Clinical Decision Making Low    Rehab Potential Good    PT Frequency 2x / week    PT Duration 6 weeks    PT Treatment/Interventions ADLs/Self Care Home Management;Electrical Stimulation;Neuromuscular re-education;Manual techniques;Therapeutic exercise;Therapeutic activities;Moist Heat;Balance training;Gait training;Patient/family education    PT Next Visit Plan modalities as needed - progress to thoracolumbar mobility, gentle core strength, functional transfers    Consulted and Agree with Plan of Care Patient             Patient will benefit from skilled therapeutic intervention in order to improve the following deficits and impairments:  Abnormal gait, Decreased activity tolerance, Decreased mobility, Difficulty walking, Increased muscle spasms, Decreased scar mobility, Decreased safety awareness, Pain, Obesity, Postural dysfunction, Impaired flexibility, Decreased balance, Decreased endurance, Decreased range of motion, Decreased strength, Hypomobility, Increased fascial restricitons  Visit Diagnosis: Chronic midline low back pain without sciatica  Abnormal posture  Muscle weakness (generalized)     Problem List Patient Active Problem List   Diagnosis Date Noted   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   Lumbar foraminal stenosis 10/09/2018   Spinal stenosis, lumbar region, with neurogenic claudication 11/16/2013    Levonne Spiller PT, DPT 07/27/2021, 2:15 PM  [facility] [address] [place], [place], [address] Phone: [phone]   Fax:  [phone]  Name:  Emily Mcbride MRN: [id number] Date of Birth: 1941/03/17

## 2021-08-07 ENCOUNTER — Ambulatory Visit: Payer: Medicare PPO | Attending: Pain Medicine | Admitting: Physical Therapy

## 2021-08-07 ENCOUNTER — Other Ambulatory Visit: Payer: Self-pay

## 2021-08-07 DIAGNOSIS — M6281 Muscle weakness (generalized): Secondary | ICD-10-CM | POA: Diagnosis present

## 2021-08-07 DIAGNOSIS — G8929 Other chronic pain: Secondary | ICD-10-CM | POA: Insufficient documentation

## 2021-08-07 DIAGNOSIS — R293 Abnormal posture: Secondary | ICD-10-CM | POA: Insufficient documentation

## 2021-08-07 DIAGNOSIS — M545 Low back pain, unspecified: Secondary | ICD-10-CM | POA: Insufficient documentation

## 2021-08-07 NOTE — Therapy (Signed)
activity tolerance, Decreased mobility, Difficulty walking, Increased muscle spasms, Decreased scar mobility, Decreased safety awareness, Pain, Obesity, Postural dysfunction, Impaired flexibility, Decreased balance, Decreased endurance, Decreased range of motion, Decreased strength, Hypomobility, Increased fascial restricitons  Visit Diagnosis: Chronic midline low back pain without sciatica  Abnormal posture  Muscle weakness (generalized)     Problem List Patient Active Problem List   Diagnosis Date Noted   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   Lumbar foraminal stenosis 10/09/2018   Spinal stenosis, lumbar region, with neurogenic claudication 11/16/2013   Emily Mcbride, PTA 08/07/21 1:49 PM   [facility] [address] [place], [place], [address] Phone: [phone]   Fax:  [phone]  Name: Emily Mcbride MRN: [id number] Date of Birth: 08/31/1941  [facility] [place], [place], [address] Phone: [phone]   Fax:  [phone]  Physical Therapy Treatment  Patient Details  Name: Emily Mcbride MRN: [id number] Date of Birth: 11-24-1941 Referring Provider (PT): Sherlyn Lees, MD   Encounter Date: 08/07/2021   PT End of Session - 08/07/21 1314     Visit Number 8    Number of Visits 12    Date for PT Re-Evaluation 08/17/21    PT Start Time 0100    PT Stop Time 0148    PT Time Calculation (min) 48 min    Activity Tolerance Patient tolerated treatment well;Patient limited by pain    Behavior During Therapy [facility] for tasks assessed/performed             Past Medical History:  Diagnosis Date   Anemia    as a child   Arthritis    osteoarthritis   Chorioretinitis    Colitis    Fibromyalgia    GERD (gastroesophageal reflux disease)    no longer takes meds    Headache(784.0)    due to a fall several mos.   History of kidney stones    Hypertension    Rheumatic fever    as a child   Skipped heart beats    due to rheumatic fever as a child   Sleep apnea    does not use CPAP due discomfort    Past Surgical History:  Procedure Laterality Date   CARDIAC CATHETERIZATION     COLONOSCOPY     DILATION AND CURETTAGE OF UTERUS     LUMBAR LAMINECTOMY/DECOMPRESSION MICRODISCECTOMY Right 11/16/2013   Procedure: LUMBAR LAMINECTOMY/Laminotomy/DECOMPRESSION MICRODISCECTOMY RIGHT LUMBAR FOUR-FIVE;  Surgeon: Charlie Pitter, MD;  Location: [facility] [facility];  Service: Neurosurgery;  Laterality: Right;  LUMBAR LAMINECTOMY/Laminotomy/DECOMPRESSION MICRODISCECTOMY RIGHT LUMBAR FOUR-FIVE   LUMBAR LAMINECTOMY/DECOMPRESSION MICRODISCECTOMY Right 10/09/2018   Procedure: LAMINECTOMY AND FORAMINOTOMY LUMBAR FIVE- SACRAL ONE;  Surgeon: Earnie Larsson, MD;  Location: [facility];  Service: Neurosurgery;  Laterality: Right;  LAMINECTOMY AND FORAMINOTOMY LUMBAR FIVE- SACRAL ONE   TONSILLECTOMY      There  were no vitals filed for this visit.   Subjective Assessment - 08/07/21 1303     Subjective COVID-19 screening performed upon entry into clinic. Patient arrived with a lot of pain and unsure of any lasting relief in therapy. Patient is going to [facility] this wednesday and will be scheduling a internal pain stimulator device to help with pain.    Pertinent History Fibromyalgia, Posterior rod and pedical screw fison L5-S2 with interdisc spacer, disc space narrowing and endplate sclerosis at H7-D4 and L1-L2    How long can you sit comfortably? according to the chair - straighback chair helps (1 - 3 hour) ; in a soft office chair 10 mins or less    How long can you stand comfortably? < 5 mins ; leans to do dishes or wash hands in bathroom    How long can you walk comfortably? 47mns or less    Diagnostic tests xray 04/21/21 ; R convex scoliosis, 5 lumbar vertebrae , lateral alignment is normal .  Posterior rod and pedical screw fison L5-S2 with interdisc spacer, no perihardware lunency, vertebral body height are maintained, disc space narrowing and endplate sclerosis at LK8-J6and L1-L2    Currently in Pain? Yes    Pain Score 9     Pain Location Back    Pain Orientation Right;Left    Pain Descriptors / Indicators Discomfort;Sore;Aching  activity tolerance, Decreased mobility, Difficulty walking, Increased muscle spasms, Decreased scar mobility, Decreased safety awareness, Pain, Obesity, Postural dysfunction, Impaired flexibility, Decreased balance, Decreased endurance, Decreased range of motion, Decreased strength, Hypomobility, Increased fascial restricitons  Visit Diagnosis: Chronic midline low back pain without sciatica  Abnormal posture  Muscle weakness (generalized)     Problem List Patient Active Problem List   Diagnosis Date Noted   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   Lumbar foraminal stenosis 10/09/2018   Spinal stenosis, lumbar region, with neurogenic claudication 11/16/2013   Emily Mcbride, PTA 08/07/21 1:49 PM   [facility] [address] [place], [place], [address] Phone: [phone]   Fax:  [phone]  Name: Emily Mcbride MRN: [id number] Date of Birth: 08/31/1941

## 2021-08-10 ENCOUNTER — Encounter: Payer: Medicare PPO | Admitting: Physical Therapy

## 2021-08-17 ENCOUNTER — Other Ambulatory Visit: Payer: Self-pay

## 2021-08-17 ENCOUNTER — Encounter: Payer: Self-pay | Admitting: Physical Therapy

## 2021-08-17 ENCOUNTER — Ambulatory Visit: Payer: Medicare PPO | Admitting: Physical Therapy

## 2021-08-17 DIAGNOSIS — M545 Low back pain, unspecified: Secondary | ICD-10-CM | POA: Diagnosis not present

## 2021-08-17 DIAGNOSIS — M6281 Muscle weakness (generalized): Secondary | ICD-10-CM

## 2021-08-17 DIAGNOSIS — R293 Abnormal posture: Secondary | ICD-10-CM

## 2021-08-17 NOTE — Therapy (Signed)
[facility] [address] [place], [place], [address] Phone: [phone]   Fax:  [phone]  Physical Therapy Treatment  Patient Details  Name: Emily Mcbride MRN: [id number] Date of Birth: [birth year] Referring Provider (PT): Jake Church, MD   Encounter Date: 08/17/2021   PT End of Session - 08/17/21 1318     Visit Number 9    Number of Visits 12    Date for PT Re-Evaluation 08/17/21    PT Start Time 1304    PT Stop Time 1349    PT Time Calculation (min) 45 min    Activity Tolerance Patient tolerated treatment well    Behavior During Therapy [facility] for tasks assessed/performed             Past Medical History:  Diagnosis Date   Anemia    as a child   Arthritis    osteoarthritis   Chorioretinitis    Colitis    Fibromyalgia    GERD (gastroesophageal reflux disease)    no longer takes meds    Headache(784.0)    due to a fall several mos.   History of kidney stones    Hypertension    Rheumatic fever    as a child   Skipped heart beats    due to rheumatic fever as a child   Sleep apnea    does not use CPAP due discomfort    Past Surgical History:  Procedure Laterality Date   CARDIAC CATHETERIZATION     COLONOSCOPY     DILATION AND CURETTAGE OF UTERUS     LUMBAR LAMINECTOMY/DECOMPRESSION MICRODISCECTOMY Right 11/16/2013   Procedure: LUMBAR LAMINECTOMY/Laminotomy/DECOMPRESSION MICRODISCECTOMY RIGHT LUMBAR FOUR-FIVE;  Surgeon: Temple Pacini, MD;  Location: [facility];  Service: Neurosurgery;  Laterality: Right;  LUMBAR LAMINECTOMY/Laminotomy/DECOMPRESSION MICRODISCECTOMY RIGHT LUMBAR FOUR-FIVE   LUMBAR LAMINECTOMY/DECOMPRESSION MICRODISCECTOMY Right 10/09/2018   Procedure: LAMINECTOMY AND FORAMINOTOMY LUMBAR FIVE- SACRAL ONE;  Surgeon: Julio Sicks, MD;  Location: [facility];  Service: Neurosurgery;  Laterality: Right;  LAMINECTOMY AND FORAMINOTOMY LUMBAR FIVE- SACRAL ONE   TONSILLECTOMY      There were no vitals filed for  this visit.   Subjective Assessment - 08/17/21 1301     Subjective COVID-19 screening performed upon entry into clinic. reports that she slept really well last night for the first time in years. May have stimulator scheduled by next week.    Pertinent History Fibromyalgia, Posterior rod and pedical screw fison L5-S2 with interdisc spacer, disc space narrowing and endplate sclerosis at L3-L4 and L1-L2    How long can you sit comfortably? according to the chair - straighback chair helps (1 - 3 hour) ; in a soft office chair 10 mins or less    How long can you stand comfortably? < 5 mins ; leans to do dishes or wash hands in bathroom    How long can you walk comfortably? or less    Diagnostic tests xray 04/21/21 ; R convex scoliosis, 5 lumbar vertebrae , lateral alignment is normal .  Posterior rod and pedical screw fison L5-S2 with interdisc spacer, no perihardware lunency, vertebral body height are maintained, disc space narrowing and endplate sclerosis at L3-L4 and L1-L2    Patient Stated Goals to just be able to walk without the pain and falling    Currently in Pain? Other (Comment)   No pain assessment provided               [facility] PT Assessment - 08/17/21 0001  Assessment   Medical Diagnosis Lumbar Radiculopathy    Referring Provider (PT) Jake Church, MD                           [facility] Adult PT Treatment/Exercise - 08/17/21 0001       Lumbar Exercises: Aerobic   Nustep L3 x18 min      Modalities   Modalities Electrical Stimulation      Electrical Stimulation   Electrical Stimulation Location B low back    Electrical Stimulation Action Pre-Mod    Electrical Stimulation Parameters 80-150 hz x10 min    Electrical Stimulation Goals Pain      Manual Therapy   Manual Therapy Soft tissue mobilization    Soft tissue mobilization Light STW to L lumbar paraspinals, QL, glute to reduce tenderness and pain                         PT Long  Term Goals - 08/07/21 1311       PT LONG TERM GOAL #1   Title Patient to be independent with bed mobility    Baseline independent with bed mobility 08/07/21    Time 3    Period Weeks    Status On-going      PT LONG TERM GOAL #2   Title Patient will be able to perform repeated sit to stand with minimal limitations in less than 20 sec to indicate improved freedom of mobility.    Baseline unable to assess due to pain    Time 3    Period Weeks    Status On-going      PT LONG TERM GOAL #3   Title Patient will be able to perform functional squat in order to prevent excessing trunk flexion and compensation with lifting.    Baseline hinges at the hips and minimally uses LE for lifting    Time 6    Period Weeks    Status On-going                   Plan - 08/17/21 1401     Clinical Impression Statement Patient reports that she slept very well last night for the first time in years. Patient still going to consult regarding lumbar stimulator at a visit next week. Patient using SPC for ambulation but observed with minimal trunk flexion during gait. No tenderness reported during manual therapy. Normal stimulation response noted following removal of the modality. Patient requires mod assist +1 for SL to sitting.    Personal Factors and Comorbidities Age;Comorbidity 1;Comorbidity 2;Time since onset of injury/illness/exacerbation;Past/Current Experience    Examination-Activity Limitations Bed Mobility;Bend;Lift;Hygiene/Grooming;Dressing;Stairs;Stand;Locomotion Level;Sleep;Transfers;Carry    Examination-Participation Restrictions Cleaning;Laundry;Meal Prep;Yard Work    Stability/Clinical Decision Making Unstable/Unpredictable    Rehab Potential Good    PT Frequency 2x / week    PT Duration 6 weeks    PT Treatment/Interventions ADLs/Self Care Home Management;Electrical Stimulation;Neuromuscular re-education;Manual techniques;Therapeutic exercise;Therapeutic activities;Moist Heat;Balance  training;Gait training;Patient/family education    PT Next Visit Plan going to [facility] for F/U will progres per his recommendation    Consulted and Agree with Plan of Care Patient             Patient will benefit from skilled therapeutic intervention in order to improve the following deficits and impairments:  Abnormal gait, Decreased activity tolerance, Decreased mobility, Difficulty walking, Increased muscle spasms, Decreased scar mobility, Decreased safety awareness, Pain, Obesity, Postural dysfunction, Impaired flexibility,  Decreased balance, Decreased endurance, Decreased range of motion, Decreased strength, Hypomobility, Increased fascial restricitons  Visit Diagnosis: Chronic midline low back pain without sciatica  Abnormal posture  Muscle weakness (generalized)     Problem List Patient Active Problem List   Diagnosis Date Noted   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   Lumbar foraminal stenosis 10/09/2018   Spinal stenosis, lumbar region, with neurogenic claudication 11/16/2013    Marvell Fuller, PTA 08/17/2021, 4:03 PM  [facility] [address] [place], [place], [address] Phone: [phone]   Fax:  [phone]  Name: Emily Mcbride MRN: [id number] Date of Birth: [birth year]

## 2021-08-21 ENCOUNTER — Ambulatory Visit: Payer: Medicare PPO | Admitting: Physical Therapy

## 2021-08-21 ENCOUNTER — Other Ambulatory Visit: Payer: Self-pay

## 2021-08-21 DIAGNOSIS — G8929 Other chronic pain: Secondary | ICD-10-CM

## 2021-08-21 DIAGNOSIS — M545 Low back pain, unspecified: Secondary | ICD-10-CM | POA: Diagnosis not present

## 2021-08-21 DIAGNOSIS — R293 Abnormal posture: Secondary | ICD-10-CM

## 2021-08-21 DIAGNOSIS — M6281 Muscle weakness (generalized): Secondary | ICD-10-CM

## 2021-08-21 NOTE — Therapy (Signed)
[facility] [place], [place], [address] Phone: [phone]   Fax:  [phone]  Physical Therapy Treatment  Patient Details  Name: Emily Mcbride MRN: [id number] Date of Birth: 05/15/1941 Referring Provider (PT): Sherlyn Lees, MD   Encounter Date: 08/21/2021   PT End of Session - 08/21/21 1306     Visit Number 10    Number of Visits 12    Date for PT Re-Evaluation 08/17/21    PT Start Time 0103    PT Stop Time 0145    PT Time Calculation (min) 42 min    Activity Tolerance Patient tolerated treatment well    Behavior During Therapy [facility] for tasks assessed/performed             Past Medical History:  Diagnosis Date   Anemia    as a child   Arthritis    osteoarthritis   Chorioretinitis    Colitis    Fibromyalgia    GERD (gastroesophageal reflux disease)    no longer takes meds    Headache(784.0)    due to a fall several mos.   History of kidney stones    Hypertension    Rheumatic fever    as a child   Skipped heart beats    due to rheumatic fever as a child   Sleep apnea    does not use CPAP due discomfort    Past Surgical History:  Procedure Laterality Date   CARDIAC CATHETERIZATION     COLONOSCOPY     DILATION AND CURETTAGE OF UTERUS     LUMBAR LAMINECTOMY/DECOMPRESSION MICRODISCECTOMY Right 11/16/2013   Procedure: LUMBAR LAMINECTOMY/Laminotomy/DECOMPRESSION MICRODISCECTOMY RIGHT LUMBAR FOUR-FIVE;  Surgeon: Charlie Pitter, MD;  Location: [facility];  Service: Neurosurgery;  Laterality: Right;  LUMBAR LAMINECTOMY/Laminotomy/DECOMPRESSION MICRODISCECTOMY RIGHT LUMBAR FOUR-FIVE   LUMBAR LAMINECTOMY/DECOMPRESSION MICRODISCECTOMY Right 10/09/2018   Procedure: LAMINECTOMY AND FORAMINOTOMY LUMBAR FIVE- SACRAL ONE;  Surgeon: Earnie Larsson, MD;  Location: [place];  Service: Neurosurgery;  Laterality: Right;  LAMINECTOMY AND FORAMINOTOMY LUMBAR FIVE- SACRAL ONE   TONSILLECTOMY      There were no vitals filed for  this visit.   Subjective Assessment - 08/21/21 1304     Subjective COVID-19 screening performed upon entry into clinic. Patient arrived with ongoing pain and still has not sen MD yet    Pertinent History Fibromyalgia, Posterior rod and pedical screw fison L5-S2 with interdisc spacer, disc space narrowing and endplate sclerosis at W2-X9 and L1-L2    How long can you sit comfortably? according to the chair - straighback chair helps (1 - 3 hour) ; in a soft office chair 10 mins or less    How long can you stand comfortably? < 5 mins ; leans to do dishes or wash hands in bathroom    How long can you walk comfortably? 67mns or less    Diagnostic tests xray 04/21/21 ; R convex scoliosis, 5 lumbar vertebrae , lateral alignment is normal .  Posterior rod and pedical screw fison L5-S2 with interdisc spacer, no perihardware lunency, vertebral body height are maintained, disc space narrowing and endplate sclerosis at LB7-J6and L1-L2    Currently in Pain? Yes    Pain Score 7     Pain Location Back    Pain Orientation Left    Pain Type Chronic pain    Pain Radiating Towards left LE    Pain Onset More than a month ago    Pain Frequency Constant  Abnormal posture  Muscle weakness (generalized)     Problem List Patient Active Problem List   Diagnosis Date Noted   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   Lumbar foraminal stenosis 10/09/2018   Spinal stenosis, lumbar region, with neurogenic claudication 11/16/2013    Phillips Climes 08/21/2021, 1:43 PM  [facility] [place], [place], [address] Phone: [phone]   Fax:  [phone]  Name: Emily Mcbride MRN: [id number] Date of Birth: 13-Nov-1941  Abnormal posture  Muscle weakness (generalized)     Problem List Patient Active Problem List   Diagnosis Date Noted   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   Lumbar foraminal stenosis 10/09/2018   Spinal stenosis, lumbar region, with neurogenic claudication 11/16/2013    Phillips Climes 08/21/2021, 1:43 PM  [facility] [place], [place], [address] Phone: [phone]   Fax:  [phone]  Name: Emily Mcbride MRN: [id number] Date of Birth: 13-Nov-1941

## 2021-08-21 NOTE — Therapy (Signed)
[facility] [place], [place], [address] Phone: [phone]   Fax:  [phone]  Progress Not/e /Reporting Period 07/06/21 to 08/21/21  See note below for Objective Data and Assessment of Progress/Goals.    Patient Details  Name: Emily Mcbride MRN: [id number] Date of Birth: 1941/11/21 Referring Provider (PT): Sherlyn Lees, MD   Encounter Date: 08/21/2021   PT End of Session - 08/21/21 1306     Visit Number 10    Number of Visits 12    Date for PT Re-Evaluation 08/17/21    PT Start Time 0103    PT Stop Time 0145    PT Time Calculation (min) 42 min    Activity Tolerance Patient tolerated treatment well    Behavior During Therapy WFL for tasks assessed/performed             Past Medical History:  Diagnosis Date   Anemia    as a child   Arthritis    osteoarthritis   Chorioretinitis    Colitis    Fibromyalgia    GERD (gastroesophageal reflux disease)    no longer takes meds    Headache(784.0)    due to a fall several mos.   History of kidney stones    Hypertension    Rheumatic fever    as a child   Skipped heart beats    due to rheumatic fever as a child   Sleep apnea    does not use CPAP due discomfort    Past Surgical History:  Procedure Laterality Date   CARDIAC CATHETERIZATION     COLONOSCOPY     DILATION AND CURETTAGE OF UTERUS     LUMBAR LAMINECTOMY/DECOMPRESSION MICRODISCECTOMY Right 11/16/2013   Procedure: LUMBAR LAMINECTOMY/Laminotomy/DECOMPRESSION MICRODISCECTOMY RIGHT LUMBAR FOUR-FIVE;  Surgeon: Charlie Pitter, MD;  Location: [facility];  Service: Neurosurgery;  Laterality: Right;  LUMBAR LAMINECTOMY/Laminotomy/DECOMPRESSION MICRODISCECTOMY RIGHT LUMBAR FOUR-FIVE   LUMBAR LAMINECTOMY/DECOMPRESSION MICRODISCECTOMY Right 10/09/2018   Procedure: LAMINECTOMY AND FORAMINOTOMY LUMBAR FIVE- SACRAL ONE;  Surgeon: Earnie Larsson, MD;  Location: [facility] [facility];  Service: Neurosurgery;  Laterality: Right;   LAMINECTOMY AND FORAMINOTOMY LUMBAR FIVE- SACRAL ONE   TONSILLECTOMY      There were no vitals filed for this visit.   Subjective Assessment - 08/21/21 1304     Subjective COVID-19 screening performed upon entry into clinic. Patient arrived with ongoing pain and still has not sen MD yet    Pertinent History Fibromyalgia, Posterior rod and pedical screw fison L5-S2 with interdisc spacer, disc space narrowing and endplate sclerosis at U7-G7 and L1-L2    How long can you sit comfortably? according to the chair - straighback chair helps (1 - 3 hour) ; in a soft office chair 10 mins or less    How long can you stand comfortably? < 5 mins ; leans to do dishes or wash hands in bathroom    How long can you walk comfortably? 15mns or less    Diagnostic tests xray 04/21/21 ; R convex scoliosis, 5 lumbar vertebrae , lateral alignment is normal .  Posterior rod and pedical screw fison L5-S2 with interdisc spacer, no perihardware lunency, vertebral body height are maintained, disc space narrowing and endplate sclerosis at LM1-O4and L1-L2    Currently in Pain? Yes    Pain Score 7     Pain Location Back    Pain Orientation Left    Pain Type Chronic pain    Pain Radiating Towards left LE  [facility] [place], [place], [address] Phone: [phone]   Fax:  [phone]  Progress Not/e /Reporting Period 07/06/21 to 08/21/21  See note below for Objective Data and Assessment of Progress/Goals.    Patient Details  Name: Emily Mcbride MRN: [id number] Date of Birth: 1941/11/21 Referring Provider (PT): Sherlyn Lees, MD   Encounter Date: 08/21/2021   PT End of Session - 08/21/21 1306     Visit Number 10    Number of Visits 12    Date for PT Re-Evaluation 08/17/21    PT Start Time 0103    PT Stop Time 0145    PT Time Calculation (min) 42 min    Activity Tolerance Patient tolerated treatment well    Behavior During Therapy WFL for tasks assessed/performed             Past Medical History:  Diagnosis Date   Anemia    as a child   Arthritis    osteoarthritis   Chorioretinitis    Colitis    Fibromyalgia    GERD (gastroesophageal reflux disease)    no longer takes meds    Headache(784.0)    due to a fall several mos.   History of kidney stones    Hypertension    Rheumatic fever    as a child   Skipped heart beats    due to rheumatic fever as a child   Sleep apnea    does not use CPAP due discomfort    Past Surgical History:  Procedure Laterality Date   CARDIAC CATHETERIZATION     COLONOSCOPY     DILATION AND CURETTAGE OF UTERUS     LUMBAR LAMINECTOMY/DECOMPRESSION MICRODISCECTOMY Right 11/16/2013   Procedure: LUMBAR LAMINECTOMY/Laminotomy/DECOMPRESSION MICRODISCECTOMY RIGHT LUMBAR FOUR-FIVE;  Surgeon: Charlie Pitter, MD;  Location: [facility];  Service: Neurosurgery;  Laterality: Right;  LUMBAR LAMINECTOMY/Laminotomy/DECOMPRESSION MICRODISCECTOMY RIGHT LUMBAR FOUR-FIVE   LUMBAR LAMINECTOMY/DECOMPRESSION MICRODISCECTOMY Right 10/09/2018   Procedure: LAMINECTOMY AND FORAMINOTOMY LUMBAR FIVE- SACRAL ONE;  Surgeon: Earnie Larsson, MD;  Location: [facility];  Service: Neurosurgery;  Laterality: Right;   LAMINECTOMY AND FORAMINOTOMY LUMBAR FIVE- SACRAL ONE   TONSILLECTOMY      There were no vitals filed for this visit.   Subjective Assessment - 08/21/21 1304     Subjective COVID-19 screening performed upon entry into clinic. Patient arrived with ongoing pain and still has not sen MD yet    Pertinent History Fibromyalgia, Posterior rod and pedical screw fison L5-S2 with interdisc spacer, disc space narrowing and endplate sclerosis at U7-G7 and L1-L2    How long can you sit comfortably? according to the chair - straighback chair helps (1 - 3 hour) ; in a soft office chair 10 mins or less    How long can you stand comfortably? < 5 mins ; leans to do dishes or wash hands in bathroom    How long can you walk comfortably? 15mns or less    Diagnostic tests xray 04/21/21 ; R convex scoliosis, 5 lumbar vertebrae , lateral alignment is normal .  Posterior rod and pedical screw fison L5-S2 with interdisc spacer, no perihardware lunency, vertebral body height are maintained, disc space narrowing and endplate sclerosis at LM1-O4and L1-L2    Currently in Pain? Yes    Pain Score 7     Pain Location Back    Pain Orientation Left    Pain Type Chronic pain    Pain Radiating Towards left LE  motion, Decreased strength, Hypomobility, Increased fascial restricitons  Visit Diagnosis: Chronic midline low back pain without sciatica  Abnormal posture  Muscle weakness (generalized)     Problem List Patient Active Problem List   Diagnosis Date Noted   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   Lumbar foraminal stenosis 10/09/2018   Spinal stenosis, lumbar region, with neurogenic claudication 11/16/2013    Ladean Raya, PTA 08/21/21 1:45 PM    Therapy screening examination/treatment/procedure(s) were performed by therapist assistant and as primary therapist I was immediately available for consultation/collaboration.  Billy Coast PT, DPT    [facility] [address] [place], [place], [address] Phone: [phone]   Fax:  [phone]  Name: JAHNYLA PARRILLO MRN: [id number] Date of Birth: 1941-03-01

## 2021-08-21 NOTE — Therapy (Signed)
back pain without sciatica  Abnormal posture  Muscle weakness (generalized)     Problem List Patient Active Problem List   Diagnosis Date Noted   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   Lumbar foraminal stenosis 10/09/2018   Spinal stenosis, lumbar region, with neurogenic claudication 11/16/2013    Phillips Climes, PTA 08/21/2021, 1:33 PM  [facility] [address] [place], [place], [address] Phone: [phone]   Fax:  [phone]  Name: Emily Mcbride MRN: [id number] Date of Birth: [birth year]  [facility] [place], [place], [address] Phone: [phone]   Fax:  [phone]  Physical Therapy Treatment  Patient Details  Name: Emily Mcbride MRN: [id number] Date of Birth: 05/15/1941 Referring Provider (PT): Sherlyn Lees, MD   Encounter Date: 08/21/2021   PT End of Session - 08/21/21 1306     Visit Number 10    Number of Visits 12    Date for PT Re-Evaluation 08/17/21    PT Start Time 0103    PT Stop Time 0145    PT Time Calculation (min) 42 min    Activity Tolerance Patient tolerated treatment well    Behavior During Therapy [facility] for tasks assessed/performed             Past Medical History:  Diagnosis Date   Anemia    as a child   Arthritis    osteoarthritis   Chorioretinitis    Colitis    Fibromyalgia    GERD (gastroesophageal reflux disease)    no longer takes meds    Headache(784.0)    due to a fall several mos.   History of kidney stones    Hypertension    Rheumatic fever    as a child   Skipped heart beats    due to rheumatic fever as a child   Sleep apnea    does not use CPAP due discomfort    Past Surgical History:  Procedure Laterality Date   CARDIAC CATHETERIZATION     COLONOSCOPY     DILATION AND CURETTAGE OF UTERUS     LUMBAR LAMINECTOMY/DECOMPRESSION MICRODISCECTOMY Right 11/16/2013   Procedure: LUMBAR LAMINECTOMY/Laminotomy/DECOMPRESSION MICRODISCECTOMY RIGHT LUMBAR FOUR-FIVE;  Surgeon: Charlie Pitter, MD;  Location: [facility];  Service: Neurosurgery;  Laterality: Right;  LUMBAR LAMINECTOMY/Laminotomy/DECOMPRESSION MICRODISCECTOMY RIGHT LUMBAR FOUR-FIVE   LUMBAR LAMINECTOMY/DECOMPRESSION MICRODISCECTOMY Right 10/09/2018   Procedure: LAMINECTOMY AND FORAMINOTOMY LUMBAR FIVE- SACRAL ONE;  Surgeon: Earnie Larsson, MD;  Location: [place];  Service: Neurosurgery;  Laterality: Right;  LAMINECTOMY AND FORAMINOTOMY LUMBAR FIVE- SACRAL ONE   TONSILLECTOMY      There were no vitals filed for  this visit.   Subjective Assessment - 08/21/21 1304     Subjective COVID-19 screening performed upon entry into clinic. Patient arrived with ongoing pain and still has not sen MD yet    Pertinent History Fibromyalgia, Posterior rod and pedical screw fison L5-S2 with interdisc spacer, disc space narrowing and endplate sclerosis at W2-X9 and L1-L2    How long can you sit comfortably? according to the chair - straighback chair helps (1 - 3 hour) ; in a soft office chair 10 mins or less    How long can you stand comfortably? < 5 mins ; leans to do dishes or wash hands in bathroom    How long can you walk comfortably? 67mns or less    Diagnostic tests xray 04/21/21 ; R convex scoliosis, 5 lumbar vertebrae , lateral alignment is normal .  Posterior rod and pedical screw fison L5-S2 with interdisc spacer, no perihardware lunency, vertebral body height are maintained, disc space narrowing and endplate sclerosis at LB7-J6and L1-L2    Currently in Pain? Yes    Pain Score 7     Pain Location Back    Pain Orientation Left    Pain Type Chronic pain    Pain Radiating Towards left LE    Pain Onset More than a month ago    Pain Frequency Constant  back pain without sciatica  Abnormal posture  Muscle weakness (generalized)     Problem List Patient Active Problem List   Diagnosis Date Noted   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   Lumbar foraminal stenosis 10/09/2018   Spinal stenosis, lumbar region, with neurogenic claudication 11/16/2013    Phillips Climes, PTA 08/21/2021, 1:33 PM  [facility] [address] [place], [place], [address] Phone: [phone]   Fax:  [phone]  Name: Emily Mcbride MRN: [id number] Date of Birth: [birth year]

## 2021-08-24 ENCOUNTER — Encounter: Payer: Medicare PPO | Admitting: Physical Therapy

## 2021-08-28 ENCOUNTER — Encounter: Payer: Medicare PPO | Admitting: Physical Therapy

## 2022-10-10 ENCOUNTER — Ambulatory Visit: Payer: Medicare PPO | Attending: Nurse Practitioner

## 2022-10-10 ENCOUNTER — Other Ambulatory Visit: Payer: Self-pay

## 2022-10-10 DIAGNOSIS — G8929 Other chronic pain: Secondary | ICD-10-CM | POA: Diagnosis present

## 2022-10-10 DIAGNOSIS — R2681 Unsteadiness on feet: Secondary | ICD-10-CM | POA: Insufficient documentation

## 2022-10-10 DIAGNOSIS — R293 Abnormal posture: Secondary | ICD-10-CM | POA: Diagnosis present

## 2022-10-10 DIAGNOSIS — M6281 Muscle weakness (generalized): Secondary | ICD-10-CM | POA: Diagnosis present

## 2022-10-10 DIAGNOSIS — M545 Low back pain, unspecified: Secondary | ICD-10-CM | POA: Diagnosis present

## 2022-10-10 NOTE — Therapy (Signed)
OUTPATIENT PHYSICAL THERAPY LOWER EXTREMITY EVALUATION   Patient Name: Emily Mcbride MRN: [id number] DOB:1941-09-22, 81 y.o., female Today's Date: 10/10/2022   PT End of Session - 10/10/22 1043     Visit Number 1    Number of Visits 12    Date for PT Re-Evaluation 11/23/22    PT Start Time 1046   Patient arrived late to his appointment.   PT Stop Time 1204    PT Time Calculation (min) 78 min    Activity Tolerance Patient tolerated treatment well    Behavior During Therapy WFL for tasks assessed/performed             Past Medical History:  Diagnosis Date   Anemia    as a child   Arthritis    osteoarthritis   Chorioretinitis    Colitis    Fibromyalgia    GERD (gastroesophageal reflux disease)    no longer takes meds    Headache(784.0)    due to a fall several mos.   History of kidney stones    Hypertension    Rheumatic fever    as a child   Skipped heart beats    due to rheumatic fever as a child   Sleep apnea    does not use CPAP due discomfort   Past Surgical History:  Procedure Laterality Date   CARDIAC CATHETERIZATION     COLONOSCOPY     DILATION AND CURETTAGE OF UTERUS     LUMBAR LAMINECTOMY/DECOMPRESSION MICRODISCECTOMY Right 11/16/2013   Procedure: LUMBAR LAMINECTOMY/Laminotomy/DECOMPRESSION MICRODISCECTOMY RIGHT LUMBAR FOUR-FIVE;  Surgeon: Temple Pacini, MD;  Location: [facility];  Service: Neurosurgery;  Laterality: Right;  LUMBAR LAMINECTOMY/Laminotomy/DECOMPRESSION MICRODISCECTOMY RIGHT LUMBAR FOUR-FIVE   LUMBAR LAMINECTOMY/DECOMPRESSION MICRODISCECTOMY Right 10/09/2018   Procedure: LAMINECTOMY AND FORAMINOTOMY LUMBAR FIVE- SACRAL ONE;  Surgeon: Julio Sicks, MD;  Location: [facility];  Service: Neurosurgery;  Laterality: Right;  LAMINECTOMY AND FORAMINOTOMY LUMBAR FIVE- SACRAL ONE   TONSILLECTOMY     Patient Active Problem List   Diagnosis Date Noted   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   Lumbar foraminal stenosis 10/09/2018   Spinal  stenosis, lumbar region, with neurogenic claudication 11/16/2013    PCP: Rebecka Apley, NP  REFERRING PROVIDER: Lavinia Sharps, NP  REFERRING DIAG: Other reduced mobility  THERAPY DIAG:  Unsteadiness on feet  Muscle weakness (generalized)  Rationale for Evaluation and Treatment Rehabilitation  ONSET DATE: years ago   SUBJECTIVE:   SUBJECTIVE STATEMENT: Patient reports that she had fallen multiple times over the past few months. She notes that she had tried therapy before, but it had not helped. She notes that she cannot get up on her own when she falls.   PERTINENT HISTORY: Chronic low back pain, OA, fibromyalgia, HTN, and multiple lumbar surgeries  PAIN:  Are you having pain? Yes: NPRS scale: 10/10 Pain location: back and legs Pain description: constant Aggravating factors: walking, laying down  Relieving factors: sitting, pain medication  PRECAUTIONS: Fall  WEIGHT BEARING RESTRICTIONS No  FALLS:  Has patient fallen in last 6 months? Yes. Number of falls 2; once when she tried to reach down to floor to pick up a pill that she dropped and the other was when she tripped walking out of an elevator. However, she had fallen more than 20 times over 6 months ago  LIVING ENVIRONMENT: Lives with: lives with their spouse Lives in: House/apartment Stairs: Yes: External: 2 steps; none Has following equipment at home: Counselling psychologist, Environmental consultant - 4 wheeled,  and elevator  OCCUPATION: retired  PLOF: Independent  PATIENT GOALS improved safety, gardening, be able to go to church (not going currently due to fear of falling)    OBJECTIVE:   COGNITION:  Overall cognitive status: Within functional limits for tasks assessed     SENSATION: Patient reports numbness in her right leg. Patient exhibited diminished sensation to light touch throughout her right lower extremity.    POSTURE: rounded shoulders, forward head, and decreased lumbar lordosis  LOWER  EXTREMITY ROM: WFL for activities assessed  LOWER EXTREMITY MMT:  MMT Right eval Left eval  Hip flexion 3/5 3/5  Hip extension    Hip abduction    Hip adduction    Hip internal rotation    Hip external rotation    Knee flexion 3/5 3/5  Knee extension 3/5 3/5  Ankle dorsiflexion 3/5 3/5  Ankle plantarflexion    Ankle inversion    Ankle eversion     (Blank rows = not tested)  FUNCTIONAL TESTS:  5 times sit to stand: 37.82 seconds with BUE support and uncontrolled descent  GAIT: Assistive device utilized: Quad cane small base Level of assistance: Modified independence Comments: decreased stride length, poor foot clearance, and decreased gait speed Recommended that patient bring her walker to her next appointment to evaluate her safety with this assistive device  TODAY'S TREATMENT:   PATIENT EDUCATION:  Education details: safety, fall risk  Person educated: Patient Education method: Explanation Education comprehension: verbalized understanding  HOME EXERCISE PROGRAM:   ASSESSMENT:  CLINICAL IMPRESSION: Patient is a 81 y.o. female who was seen today for physical therapy evaluation and treatment for decreased mobility with a history of falling. She is a high fall risk as evidenced by her significant history of falling, her gait speed, five time sit to stand time, and her lower extremity strength. She was educated on safely performing her daily activities at home. She was encouraged to bring utilize her walker to her next appointment for improved safety and stability with ambulation. Recommend that she continue with skilled physical therapy to address her remaining impairments to maximize her safety and functional mobility.    OBJECTIVE IMPAIRMENTS Abnormal gait, decreased activity tolerance, decreased balance, decreased mobility, difficulty walking, decreased strength, impaired sensation, and pain.   ACTIVITY LIMITATIONS carrying, lifting, bending, standing, squatting,  stairs, transfers, toileting, and locomotion level  PARTICIPATION LIMITATIONS: meal prep, cleaning, laundry, shopping, and community activity  PERSONAL FACTORS Age, Fitness, Time since onset of injury/illness/exacerbation, Transportation, and 3+ comorbidities: Chronic low back pain, OA, fibromyalgia, HTN, and multiple lumbar surgeries  are also affecting patient's functional outcome.   REHAB POTENTIAL: Fair    CLINICAL DECISION MAKING: Unstable/unpredictable  EVALUATION COMPLEXITY: High   GOALS: Goals reviewed with patient? No LONG TERM GOALS: Target date: 11/07/2022   Patient will be independent with her HEP.  Baseline:  Goal status: INITIAL  2.  Patient will improve her five time sit to stand time to 25 seconds or less for improved lower extremity power.  Baseline:  Goal status: INITIAL  3.  Patient will improve her bilateral lower extremity strength to at least 3+/5 throughout for improved functional mobility. Baseline:  Goal status: INITIAL  PLAN: PT FREQUENCY: 2-3x/week  PT DURATION: 4 weeks  PLANNED INTERVENTIONS: Therapeutic exercises, Therapeutic activity, Neuromuscular re-education, Balance training, Gait training, Patient/Family education, Self Care, Stair training, and Re-evaluation  PLAN FOR NEXT SESSION: nustep, seated lower extremity strengthening   Granville Lewis, PT 10/10/2022, 3:16 PM

## 2022-10-16 ENCOUNTER — Ambulatory Visit: Payer: Medicare PPO

## 2022-10-16 DIAGNOSIS — M6281 Muscle weakness (generalized): Secondary | ICD-10-CM

## 2022-10-16 DIAGNOSIS — R2681 Unsteadiness on feet: Secondary | ICD-10-CM | POA: Diagnosis not present

## 2022-10-16 NOTE — Therapy (Signed)
OUTPATIENT PHYSICAL THERAPY LOWER EXTREMITY TREATMENT   Patient Name: Emily Mcbride MRN: [id number] DOB:Nov 20, 1941, 81 y.o., female Today's Date: 10/16/2022   PT End of Session - 10/16/22 1312     Visit Number 2    Number of Visits 12    Date for PT Re-Evaluation 11/23/22    PT Start Time 1300    PT Stop Time 1344    PT Time Calculation (min) 44 min    Activity Tolerance Patient tolerated treatment well    Behavior During Therapy WFL for tasks assessed/performed              Past Medical History:  Diagnosis Date   Anemia    as a child   Arthritis    osteoarthritis   Chorioretinitis    Colitis    Fibromyalgia    GERD (gastroesophageal reflux disease)    no longer takes meds    Headache(784.0)    due to a fall several mos.   History of kidney stones    Hypertension    Rheumatic fever    as a child   Skipped heart beats    due to rheumatic fever as a child   Sleep apnea    does not use CPAP due discomfort   Past Surgical History:  Procedure Laterality Date   CARDIAC CATHETERIZATION     COLONOSCOPY     DILATION AND CURETTAGE OF UTERUS     LUMBAR LAMINECTOMY/DECOMPRESSION MICRODISCECTOMY Right 11/16/2013   Procedure: LUMBAR LAMINECTOMY/Laminotomy/DECOMPRESSION MICRODISCECTOMY RIGHT LUMBAR FOUR-FIVE;  Surgeon: Temple Pacini, MD;  Location: [facility];  Service: Neurosurgery;  Laterality: Right;  LUMBAR LAMINECTOMY/Laminotomy/DECOMPRESSION MICRODISCECTOMY RIGHT LUMBAR FOUR-FIVE   LUMBAR LAMINECTOMY/DECOMPRESSION MICRODISCECTOMY Right 10/09/2018   Procedure: LAMINECTOMY AND FORAMINOTOMY LUMBAR FIVE- SACRAL ONE;  Surgeon: Julio Sicks, MD;  Location: [facility];  Service: Neurosurgery;  Laterality: Right;  LAMINECTOMY AND FORAMINOTOMY LUMBAR FIVE- SACRAL ONE   TONSILLECTOMY     Patient Active Problem List   Diagnosis Date Noted   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   Lumbar foraminal stenosis 10/09/2018   Spinal stenosis, lumbar region, with neurogenic  claudication 11/16/2013    PCP: Rebecka Apley, NP  REFERRING PROVIDER: Lavinia Sharps, NP  REFERRING DIAG: Other reduced mobility  THERAPY DIAG:  Unsteadiness on feet  Muscle weakness (generalized)  Rationale for Evaluation and Treatment Rehabilitation  ONSET DATE: years ago   SUBJECTIVE:   SUBJECTIVE STATEMENT:  Patient reports that her back is hurting quite a bit today.   PERTINENT HISTORY: Chronic low back pain, OA, fibromyalgia, HTN, and multiple lumbar surgeries  PAIN:  Are you having pain? Yes: NPRS scale: 8/10 Pain location: back and legs Pain description: constant Aggravating factors: walking, laying down  Relieving factors: sitting, pain medication  PRECAUTIONS: Fall  WEIGHT BEARING RESTRICTIONS No  FALLS:  Has patient fallen in last 6 months? Yes. Number of falls 2; once when she tried to reach down to floor to pick up a pill that she dropped and the other was when she tripped walking out of an elevator. However, she had fallen more than 20 times over 6 months ago  LIVING ENVIRONMENT: Lives with: lives with their spouse Lives in: House/apartment Stairs: Yes: External: 2 steps; none Has following equipment at home: Counselling psychologist, Environmental consultant - 4 wheeled, and elevator  OCCUPATION: retired  PLOF: Independent  PATIENT GOALS improved safety, gardening, be able to go to church (not going currently due to fear of falling)    OBJECTIVE: all  objective measures were assessed on her initial evaluation on 10/10/22 unless otherwise noted  COGNITION:  Overall cognitive status: Within functional limits for tasks assessed     SENSATION: Patient reports numbness in her right leg. Patient exhibited diminished sensation to light touch throughout her right lower extremity.    POSTURE: rounded shoulders, forward head, and decreased lumbar lordosis  LOWER EXTREMITY ROM: WFL for activities assessed  LOWER EXTREMITY MMT:  MMT Right eval  Left eval  Hip flexion 3/5 3/5  Hip extension    Hip abduction    Hip adduction    Hip internal rotation    Hip external rotation    Knee flexion 3/5 3/5  Knee extension 3/5 3/5  Ankle dorsiflexion 3/5 3/5  Ankle plantarflexion    Ankle inversion    Ankle eversion     (Blank rows = not tested)  FUNCTIONAL TESTS:  5 times sit to stand: 37.82 seconds with BUE support and uncontrolled descent  GAIT: Assistive device utilized: Quad cane small base Level of assistance: Modified independence Comments: decreased stride length, poor foot clearance, and decreased gait speed Recommended that patient bring her walker to her next appointment to evaluate her safety with this assistive device  TODAY'S TREATMENT:                                   10/17 EXERCISE LOG  Exercise Repetitions and Resistance Comments  Nustep L3 x 15 minutes   Hip ADD isometric (seated) 2 minutes w/ 5 second hold   Seated clams 3 minutes    LAQ  2 minutes    Seated marching  20 reps each    Seated heel/toe raises 3 minutes        Blank cell = exercise not performed today   PATIENT EDUCATION:  Education details: safety, fall risk  Person educated: Patient Education method: Explanation Education comprehension: verbalized understanding  HOME EXERCISE PROGRAM:   ASSESSMENT:  CLINICAL IMPRESSION: Patient was introduced to multiple new interventions for improved lower extremity strength and endurance. She required minimal cueing with today's new interventions for proper pacing and biomechanics to avoid a significant increase in lower extremity fatigue. Fatigue was her primary limitation with today's interventions as she required multiple rest breaks throughout treatment. Treatment focused on seated interventions due to increased lower extremity fatigue. She reported feeling tired upon the conclusion of treatment. She continues to require skilled physical therapy to address her remaining impairments to maximize  her safety and functional mobility.   OBJECTIVE IMPAIRMENTS Abnormal gait, decreased activity tolerance, decreased balance, decreased mobility, difficulty walking, decreased strength, impaired sensation, and pain.   ACTIVITY LIMITATIONS carrying, lifting, bending, standing, squatting, stairs, transfers, toileting, and locomotion level  PARTICIPATION LIMITATIONS: meal prep, cleaning, laundry, shopping, and community activity  PERSONAL FACTORS Age, Fitness, Time since onset of injury/illness/exacerbation, Transportation, and 3+ comorbidities: Chronic low back pain, OA, fibromyalgia, HTN, and multiple lumbar surgeries  are also affecting patient's functional outcome.   REHAB POTENTIAL: Fair    CLINICAL DECISION MAKING: Unstable/unpredictable  EVALUATION COMPLEXITY: High   GOALS: Goals reviewed with patient? No LONG TERM GOALS: Target date: 11/07/2022   Patient will be independent with her HEP.  Baseline:  Goal status: INITIAL  2.  Patient will improve her five time sit to stand time to 25 seconds or less for improved lower extremity power.  Baseline:  Goal status: INITIAL  3.  Patient will improve her  bilateral lower extremity strength to at least 3+/5 throughout for improved functional mobility. Baseline:  Goal status: INITIAL  PLAN: PT FREQUENCY: 2-3x/week  PT DURATION: 4 weeks  PLANNED INTERVENTIONS: Therapeutic exercises, Therapeutic activity, Neuromuscular re-education, Balance training, Gait training, Patient/Family education, Self Care, Stair training, and Re-evaluation  PLAN FOR NEXT SESSION: nustep, seated lower extremity strengthening   Granville Lewis, PT 10/16/2022, 1:56 PM

## 2022-10-18 ENCOUNTER — Ambulatory Visit: Payer: Medicare PPO | Admitting: Physical Therapy

## 2022-10-18 ENCOUNTER — Encounter: Payer: Self-pay | Admitting: Physical Therapy

## 2022-10-18 DIAGNOSIS — M6281 Muscle weakness (generalized): Secondary | ICD-10-CM

## 2022-10-18 DIAGNOSIS — R2681 Unsteadiness on feet: Secondary | ICD-10-CM | POA: Diagnosis not present

## 2022-10-18 NOTE — Therapy (Signed)
OUTPATIENT PHYSICAL THERAPY LOWER EXTREMITY TREATMENT   Patient Name: Emily Mcbride MRN: [id number] DOB:[birth year], 81 y.o., female Today's Date: 10/18/2022   PT End of Session - 10/18/22 1304     Visit Number 3    Number of Visits 12    Date for PT Re-Evaluation 11/23/22    PT Start Time 1301    PT Stop Time 1342    PT Time Calculation (min) 41 min    Activity Tolerance Patient tolerated treatment well    Behavior During Therapy WFL for tasks assessed/performed              Past Medical History:  Diagnosis Date   Anemia    as a child   Arthritis    osteoarthritis   Chorioretinitis    Colitis    Fibromyalgia    GERD (gastroesophageal reflux disease)    no longer takes meds    Headache(784.0)    due to a fall several mos.   History of kidney stones    Hypertension    Rheumatic fever    as a child   Skipped heart beats    due to rheumatic fever as a child   Sleep apnea    does not use CPAP due discomfort   Past Surgical History:  Procedure Laterality Date   CARDIAC CATHETERIZATION     COLONOSCOPY     DILATION AND CURETTAGE OF UTERUS     LUMBAR LAMINECTOMY/DECOMPRESSION MICRODISCECTOMY Right 11/16/2013   Procedure: LUMBAR LAMINECTOMY/Laminotomy/DECOMPRESSION MICRODISCECTOMY RIGHT LUMBAR FOUR-FIVE;  Surgeon: Temple Pacini, MD;  Location: [facility];  Service: Neurosurgery;  Laterality: Right;  LUMBAR LAMINECTOMY/Laminotomy/DECOMPRESSION MICRODISCECTOMY RIGHT LUMBAR FOUR-FIVE   LUMBAR LAMINECTOMY/DECOMPRESSION MICRODISCECTOMY Right 10/09/2018   Procedure: LAMINECTOMY AND FORAMINOTOMY LUMBAR FIVE- SACRAL ONE;  Surgeon: Julio Sicks, MD;  Location: [facility];  Service: Neurosurgery;  Laterality: Right;  LAMINECTOMY AND FORAMINOTOMY LUMBAR FIVE- SACRAL ONE   TONSILLECTOMY     Patient Active Problem List   Diagnosis Date Noted   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   Lumbar foraminal stenosis 10/09/2018   Spinal stenosis, lumbar region, with neurogenic  claudication 11/16/2013    PCP: Rebecka Apley, NP  REFERRING PROVIDER: Lavinia Sharps, NP  REFERRING DIAG: Other reduced mobility  THERAPY DIAG:  Unsteadiness on feet  Muscle weakness (generalized)  Rationale for Evaluation and Treatment Rehabilitation  ONSET DATE: years ago   SUBJECTIVE:   SUBJECTIVE STATEMENT:  Reports that she is very weak today. States that she stood for a long time yesterday in prepping items for their trip to [place].  PERTINENT HISTORY: Chronic low back pain, OA, fibromyalgia, HTN, and multiple lumbar surgeries  PAIN:  Are you having pain? Yes: NPRS scale: no numerical rating provided/10 Pain location: back and legs Pain description: constant Aggravating factors: walking, laying down  Relieving factors: sitting, pain medication  PRECAUTIONS: Fall  WEIGHT BEARING RESTRICTIONS No  PATIENT GOALS improved safety, gardening, be able to go to church (not going currently due to fear of falling)   OBJECTIVE:  LOWER EXTREMITY MMT:  MMT Right eval Left eval  Hip flexion 3/5 3/5  Hip extension    Hip abduction    Hip adduction    Hip internal rotation    Hip external rotation    Knee flexion 3/5 3/5  Knee extension 3/5 3/5  Ankle dorsiflexion 3/5 3/5  Ankle plantarflexion    Ankle inversion    Ankle eversion     (Blank rows = not tested)  FUNCTIONAL TESTS:  5 times sit to stand: 37.82 seconds with BUE support and uncontrolled descent  GAIT: Assistive device utilized: Quad cane small base Level of assistance: Modified independence Comments: decreased stride length, poor foot clearance, and decreased gait speed Recommended that patient bring her walker to her next appointment to evaluate her safety with this assistive device  TODAY'S TREATMENT:                                   10/17 EXERCISE LOG  Exercise Repetitions and Resistance Comments  Nustep L3 x 15 minutes   Hip ADD isometric (seated) 2 minutes w/ 5 second  hold   Seated clams 3 minutes yellow   LAQ  AROM x30 reps   Seated marching  20 reps each AROM   HS curl Yellow x20 reps        Blank cell = exercise not performed today   PATIENT EDUCATION:  Education details: safety, fall risk  Person educated: Patient Education method: Explanation Education comprehension: verbalized understanding  HOME EXERCISE PROGRAM:  ASSESSMENT:  CLINICAL IMPRESSION: Patient presented in clinic with reports of increased LE weakness after prolonged standing yesterday. Patient also reports an increased sensitivity and pain with contact to LEs as well. Patient able to tolerate therex fairly well although theraband placement had to be adjusted for HS curls. Patient overall very fatigued with treatment but reported feeling better following end of treatment.  OBJECTIVE IMPAIRMENTS Abnormal gait, decreased activity tolerance, decreased balance, decreased mobility, difficulty walking, decreased strength, impaired sensation, and pain.   ACTIVITY LIMITATIONS carrying, lifting, bending, standing, squatting, stairs, transfers, toileting, and locomotion level  PARTICIPATION LIMITATIONS: meal prep, cleaning, laundry, shopping, and community activity  PERSONAL FACTORS Age, Fitness, Time since onset of injury/illness/exacerbation, Transportation, and 3+ comorbidities: Chronic low back pain, OA, fibromyalgia, HTN, and multiple lumbar surgeries  are also affecting patient's functional outcome.   REHAB POTENTIAL: Fair    CLINICAL DECISION MAKING: Unstable/unpredictable  EVALUATION COMPLEXITY: High   GOALS: Goals reviewed with patient? No LONG TERM GOALS: Target date: 11/07/2022   Patient will be independent with her HEP.  Baseline:  Goal status: INITIAL  2.  Patient will improve her five time sit to stand time to 25 seconds or less for improved lower extremity power.  Baseline:  Goal status: INITIAL  3.  Patient will improve her bilateral lower extremity strength  to at least 3+/5 throughout for improved functional mobility. Baseline:  Goal status: INITIAL  PLAN: PT FREQUENCY: 2-3x/week  PT DURATION: 4 weeks  PLANNED INTERVENTIONS: Therapeutic exercises, Therapeutic activity, Neuromuscular re-education, Balance training, Gait training, Patient/Family education, Self Care, Stair training, and Re-evaluation  PLAN FOR NEXT SESSION: nustep, seated lower extremity strengthening   Marvell Fuller, PTA 10/18/2022, 3:28 PM

## 2022-10-23 ENCOUNTER — Encounter: Payer: Self-pay | Admitting: Physical Therapy

## 2022-10-23 ENCOUNTER — Ambulatory Visit: Payer: Medicare PPO | Admitting: Physical Therapy

## 2022-10-23 DIAGNOSIS — R2681 Unsteadiness on feet: Secondary | ICD-10-CM | POA: Diagnosis not present

## 2022-10-23 DIAGNOSIS — M6281 Muscle weakness (generalized): Secondary | ICD-10-CM

## 2022-10-23 NOTE — Therapy (Signed)
OUTPATIENT PHYSICAL THERAPY LOWER EXTREMITY TREATMENT   Patient Name: Emily Mcbride MRN: [id number] DOB:[birth year], 81 y.o., female Today's Date: 10/23/2022   PT End of Session - 10/23/22 1316     Visit Number 4    Number of Visits 12    Date for PT Re-Evaluation 11/23/22    PT Start Time 1303    PT Stop Time 1344    PT Time Calculation (min) 41 min    Activity Tolerance Patient tolerated treatment well    Behavior During Therapy WFL for tasks assessed/performed            Past Medical History:  Diagnosis Date   Anemia    as a child   Arthritis    osteoarthritis   Chorioretinitis    Colitis    Fibromyalgia    GERD (gastroesophageal reflux disease)    no longer takes meds    Headache(784.0)    due to a fall several mos.   History of kidney stones    Hypertension    Rheumatic fever    as a child   Skipped heart beats    due to rheumatic fever as a child   Sleep apnea    does not use CPAP due discomfort   Past Surgical History:  Procedure Laterality Date   CARDIAC CATHETERIZATION     COLONOSCOPY     DILATION AND CURETTAGE OF UTERUS     LUMBAR LAMINECTOMY/DECOMPRESSION MICRODISCECTOMY Right 11/16/2013   Procedure: LUMBAR LAMINECTOMY/Laminotomy/DECOMPRESSION MICRODISCECTOMY RIGHT LUMBAR FOUR-FIVE;  Surgeon: Temple Pacini, MD;  Location: [facility];  Service: Neurosurgery;  Laterality: Right;  LUMBAR LAMINECTOMY/Laminotomy/DECOMPRESSION MICRODISCECTOMY RIGHT LUMBAR FOUR-FIVE   LUMBAR LAMINECTOMY/DECOMPRESSION MICRODISCECTOMY Right 10/09/2018   Procedure: LAMINECTOMY AND FORAMINOTOMY LUMBAR FIVE- SACRAL ONE;  Surgeon: Julio Sicks, MD;  Location: [facility];  Service: Neurosurgery;  Laterality: Right;  LAMINECTOMY AND FORAMINOTOMY LUMBAR FIVE- SACRAL ONE   TONSILLECTOMY     Patient Active Problem List   Diagnosis Date Noted   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   Lumbar foraminal stenosis 10/09/2018   Spinal stenosis, lumbar region, with neurogenic  claudication 11/16/2013   PCP: Rebecka Apley, NP  REFERRING PROVIDER: Lavinia Sharps, NP  REFERRING DIAG: Other reduced mobility  THERAPY DIAG:  Unsteadiness on feet  Muscle weakness (generalized)  Rationale for Evaluation and Treatment Rehabilitation  ONSET DATE: years ago   SUBJECTIVE:   SUBJECTIVE STATEMENT:  Very sore as she has used her nustep at home and has rode 4 miles in two days.  PERTINENT HISTORY: Chronic low back pain, OA, fibromyalgia, HTN, and multiple lumbar surgeries  PAIN:  Are you having pain? Yes: NPRS scale: no numerical rating provided/10 Pain location: B quads Pain description: Sore Aggravating factors: walking, laying down  Relieving factors: sitting, pain medication  PRECAUTIONS: Fall  WEIGHT BEARING RESTRICTIONS No  PATIENT GOALS improved safety, gardening, be able to go to church (not going currently due to fear of falling)   OBJECTIVE:  LOWER EXTREMITY MMT:  MMT Right eval Left eval  Hip flexion 3/5 3/5  Hip extension    Hip abduction    Hip adduction    Hip internal rotation    Hip external rotation    Knee flexion 3/5 3/5  Knee extension 3/5 3/5  Ankle dorsiflexion 3/5 3/5  Ankle plantarflexion    Ankle inversion    Ankle eversion     (Blank rows = not tested)  FUNCTIONAL TESTS:  5 times sit to stand: 37.82 seconds with BUE  support and uncontrolled descent  GAIT: Assistive device utilized: Quad cane small base Level of assistance: Modified independence Comments: decreased stride length, poor foot clearance, and decreased gait speed Recommended that patient bring her walker to her next appointment to evaluate her safety with this assistive device  TODAY'S TREATMENT:                                   10/17 EXERCISE LOG  Exercise Repetitions and Resistance Comments  Nustep L3 x 15 minutes   Seated clams 3 minutes Red   LAQ  AROM x30 reps   Seated marching  30 reps each AROM   HS curl Red x30 reps         Blank cell = exercise not performed today   PATIENT EDUCATION:  Education details: safety, fall risk  Person educated: Patient Education method: Explanation Education comprehension: verbalized understanding  HOME EXERCISE PROGRAM:  ASSESSMENT:  CLINICAL IMPRESSION: Patient presented in clinic with reports of soreness after riding her nustep at home for longer periods. Patient able to tolerate and feel somewhat better after nustep. More resistance utilized with more reps but no complaints during the treatment. Exercise rest breaks were utilized during session. No complaints following end of treatment.  OBJECTIVE IMPAIRMENTS Abnormal gait, decreased activity tolerance, decreased balance, decreased mobility, difficulty walking, decreased strength, impaired sensation, and pain.   ACTIVITY LIMITATIONS carrying, lifting, bending, standing, squatting, stairs, transfers, toileting, and locomotion level  PARTICIPATION LIMITATIONS: meal prep, cleaning, laundry, shopping, and community activity  PERSONAL FACTORS Age, Fitness, Time since onset of injury/illness/exacerbation, Transportation, and 3+ comorbidities: Chronic low back pain, OA, fibromyalgia, HTN, and multiple lumbar surgeries  are also affecting patient's functional outcome.   REHAB POTENTIAL: Fair    CLINICAL DECISION MAKING: Unstable/unpredictable  EVALUATION COMPLEXITY: High   GOALS: Goals reviewed with patient? No LONG TERM GOALS: Target date: 11/07/2022   Patient will be independent with her HEP.  Baseline:  Goal status: INITIAL  2.  Patient will improve her five time sit to stand time to 25 seconds or less for improved lower extremity power.  Baseline:  Goal status: INITIAL  3.  Patient will improve her bilateral lower extremity strength to at least 3+/5 throughout for improved functional mobility. Baseline:  Goal status: INITIAL  PLAN: PT FREQUENCY: 2-3x/week  PT DURATION: 4 weeks  PLANNED  INTERVENTIONS: Therapeutic exercises, Therapeutic activity, Neuromuscular re-education, Balance training, Gait training, Patient/Family education, Self Care, Stair training, and Re-evaluation  PLAN FOR NEXT SESSION: nustep, seated lower extremity strengthening   Marvell Fuller, PTA 10/23/2022, 1:55 PM

## 2022-10-25 ENCOUNTER — Ambulatory Visit: Payer: Medicare PPO

## 2022-10-25 DIAGNOSIS — R2681 Unsteadiness on feet: Secondary | ICD-10-CM | POA: Diagnosis not present

## 2022-10-25 DIAGNOSIS — R293 Abnormal posture: Secondary | ICD-10-CM

## 2022-10-25 DIAGNOSIS — M6281 Muscle weakness (generalized): Secondary | ICD-10-CM

## 2022-10-25 DIAGNOSIS — M545 Low back pain, unspecified: Secondary | ICD-10-CM

## 2022-10-25 NOTE — Therapy (Signed)
OUTPATIENT PHYSICAL THERAPY LOWER EXTREMITY TREATMENT   Patient Name: Emily Mcbride MRN: [id number] DOB:Jun 15, 1941, 81 y.o., female Today's Date: 10/25/2022   PT End of Session - 10/25/22 1310     Visit Number 5    Number of Visits 12    Date for PT Re-Evaluation 11/23/22    PT Start Time 1300    Activity Tolerance Patient tolerated treatment well    Behavior During Therapy [facility] for tasks assessed/performed            Past Medical History:  Diagnosis Date   Anemia    as a child   Arthritis    osteoarthritis   Chorioretinitis    Colitis    Fibromyalgia    GERD (gastroesophageal reflux disease)    no longer takes meds    Headache(784.0)    due to a fall several mos.   History of kidney stones    Hypertension    Rheumatic fever    as a child   Skipped heart beats    due to rheumatic fever as a child   Sleep apnea    does not use CPAP due discomfort   Past Surgical History:  Procedure Laterality Date   CARDIAC CATHETERIZATION     COLONOSCOPY     DILATION AND CURETTAGE OF UTERUS     LUMBAR LAMINECTOMY/DECOMPRESSION MICRODISCECTOMY Right 11/16/2013   Procedure: LUMBAR LAMINECTOMY/Laminotomy/DECOMPRESSION MICRODISCECTOMY RIGHT LUMBAR FOUR-FIVE;  Surgeon: Temple Pacini, MD;  Location: [facility];  Service: Neurosurgery;  Laterality: Right;  LUMBAR LAMINECTOMY/Laminotomy/DECOMPRESSION MICRODISCECTOMY RIGHT LUMBAR FOUR-FIVE   LUMBAR LAMINECTOMY/DECOMPRESSION MICRODISCECTOMY Right 10/09/2018   Procedure: LAMINECTOMY AND FORAMINOTOMY LUMBAR FIVE- SACRAL ONE;  Surgeon: Julio Sicks, MD;  Location: [facility];  Service: Neurosurgery;  Laterality: Right;  LAMINECTOMY AND FORAMINOTOMY LUMBAR FIVE- SACRAL ONE   TONSILLECTOMY     Patient Active Problem List   Diagnosis Date Noted   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   Lumbar foraminal stenosis 10/09/2018   Spinal stenosis, lumbar region, with neurogenic claudication 11/16/2013   PCP: Rebecka Apley,  NP  REFERRING PROVIDER: Lavinia Sharps, NP  REFERRING DIAG: Other reduced mobility  THERAPY DIAG:  Unsteadiness on feet  Muscle weakness (generalized)  Chronic midline low back pain without sciatica  Abnormal posture  Rationale for Evaluation and Treatment Rehabilitation  ONSET DATE: years ago   SUBJECTIVE:   SUBJECTIVE STATEMENT:  Pt reports that her stimulator went out during the night and required her daughter to help turn it back on.  Reports that her pain got up to 10/10 earlier this morning, but is feeling much better now.  PERTINENT HISTORY: Chronic low back pain, OA, fibromyalgia, HTN, and multiple lumbar surgeries  PAIN:  Are you having pain? No  PRECAUTIONS: Fall  WEIGHT BEARING RESTRICTIONS No  PATIENT GOALS improved safety, gardening, be able to go to church (not going currently due to fear of falling)   OBJECTIVE:  LOWER EXTREMITY MMT:  MMT Right eval Left eval  Hip flexion 3/5 3/5  Hip extension    Hip abduction    Hip adduction    Hip internal rotation    Hip external rotation    Knee flexion 3/5 3/5  Knee extension 3/5 3/5  Ankle dorsiflexion 3/5 3/5  Ankle plantarflexion    Ankle inversion    Ankle eversion     (Blank rows = not tested)  FUNCTIONAL TESTS:  5 times sit to stand: 37.82 seconds with BUE support and uncontrolled descent  GAIT: Assistive device utilized:  Quad cane small base Level of assistance: Modified independence Comments: decreased stride length, poor foot clearance, and decreased gait speed Recommended that patient bring her walker to her next appointment to evaluate her safety with this assistive device  TODAY'S TREATMENT:                                   10/26 EXERCISE LOG  Exercise Repetitions and Resistance Comments  Nustep L3 x 15 minutes   Seated clams 3 minutes Red   LAQ  AROM x30 reps   Seated marching  30 reps each AROM   HS curl Red x30 reps   Ball Squeeze 3 mins    Blank cell =  exercise not performed today   PATIENT EDUCATION:  Education details: safety, fall risk  Person educated: Patient Education method: Explanation Education comprehension: verbalized understanding  HOME EXERCISE PROGRAM:  ASSESSMENT:  CLINICAL IMPRESSION: Pt arrives for today's treatment session denying any pain, but reports that her stimulator stopped working during the night last night. Due to this her pain got up to 10/10 this morning, but is feeling much better now. Reviewed previously performed exercises with good result, with rest breaks given as needed due to fatigue.  Pt denied increase in pain, but does endorse increased fatigue at completion of today's treatment session.  OBJECTIVE IMPAIRMENTS Abnormal gait, decreased activity tolerance, decreased balance, decreased mobility, difficulty walking, decreased strength, impaired sensation, and pain.   ACTIVITY LIMITATIONS carrying, lifting, bending, standing, squatting, stairs, transfers, toileting, and locomotion level  PARTICIPATION LIMITATIONS: meal prep, cleaning, laundry, shopping, and community activity  PERSONAL FACTORS Age, Fitness, Time since onset of injury/illness/exacerbation, Transportation, and 3+ comorbidities: Chronic low back pain, OA, fibromyalgia, HTN, and multiple lumbar surgeries  are also affecting patient's functional outcome.   REHAB POTENTIAL: Fair    CLINICAL DECISION MAKING: Unstable/unpredictable  EVALUATION COMPLEXITY: High   GOALS: Goals reviewed with patient? No LONG TERM GOALS: Target date: 11/07/2022   Patient will be independent with her HEP.  Baseline:  Goal status: INITIAL  2.  Patient will improve her five time sit to stand time to 25 seconds or less for improved lower extremity power.  Baseline:  Goal status: INITIAL  3.  Patient will improve her bilateral lower extremity strength to at least 3+/5 throughout for improved functional mobility. Baseline:  Goal status:  INITIAL  PLAN: PT FREQUENCY: 2-3x/week  PT DURATION: 4 weeks  PLANNED INTERVENTIONS: Therapeutic exercises, Therapeutic activity, Neuromuscular re-education, Balance training, Gait training, Patient/Family education, Self Care, Stair training, and Re-evaluation  PLAN FOR NEXT SESSION: nustep, seated lower extremity strengthening   Newman Pies, PTA 10/25/2022, 1:10 PM

## 2022-10-30 ENCOUNTER — Ambulatory Visit: Payer: Medicare PPO | Admitting: Physical Therapy

## 2022-10-30 ENCOUNTER — Encounter: Payer: Self-pay | Admitting: Physical Therapy

## 2022-10-30 DIAGNOSIS — M6281 Muscle weakness (generalized): Secondary | ICD-10-CM

## 2022-10-30 DIAGNOSIS — R2681 Unsteadiness on feet: Secondary | ICD-10-CM | POA: Diagnosis not present

## 2022-10-30 NOTE — Therapy (Signed)
OUTPATIENT PHYSICAL THERAPY LOWER EXTREMITY TREATMENT   Patient Name: Emily Mcbride MRN: [id number] DOB:04-02-1941, 81 y.o., female Today's Date: 10/30/2022   PT End of Session - 10/30/22 1300     Visit Number 6    Number of Visits 12    Date for PT Re-Evaluation 11/23/22    PT Start Time 1301    PT Stop Time 1347    PT Time Calculation (min) 46 min    Activity Tolerance Patient tolerated treatment well    Behavior During Therapy WFL for tasks assessed/performed            Past Medical History:  Diagnosis Date   Anemia    as a child   Arthritis    osteoarthritis   Chorioretinitis    Colitis    Fibromyalgia    GERD (gastroesophageal reflux disease)    no longer takes meds    Headache(784.0)    due to a fall several mos.   History of kidney stones    Hypertension    Rheumatic fever    as a child   Skipped heart beats    due to rheumatic fever as a child   Sleep apnea    does not use CPAP due discomfort   Past Surgical History:  Procedure Laterality Date   CARDIAC CATHETERIZATION     COLONOSCOPY     DILATION AND CURETTAGE OF UTERUS     LUMBAR LAMINECTOMY/DECOMPRESSION MICRODISCECTOMY Right 11/16/2013   Procedure: LUMBAR LAMINECTOMY/Laminotomy/DECOMPRESSION MICRODISCECTOMY RIGHT LUMBAR FOUR-FIVE;  Surgeon: Temple Pacini, MD;  Location: [facility];  Service: Neurosurgery;  Laterality: Right;  LUMBAR LAMINECTOMY/Laminotomy/DECOMPRESSION MICRODISCECTOMY RIGHT LUMBAR FOUR-FIVE   LUMBAR LAMINECTOMY/DECOMPRESSION MICRODISCECTOMY Right 10/09/2018   Procedure: LAMINECTOMY AND FORAMINOTOMY LUMBAR FIVE- SACRAL ONE;  Surgeon: Julio Sicks, MD;  Location: [facility];  Service: Neurosurgery;  Laterality: Right;  LAMINECTOMY AND FORAMINOTOMY LUMBAR FIVE- SACRAL ONE   TONSILLECTOMY     Patient Active Problem List   Diagnosis Date Noted   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   Lumbar foraminal stenosis 10/09/2018   Spinal stenosis, lumbar region, with neurogenic  claudication 11/16/2013   PCP: Rebecka Apley, NP  REFERRING PROVIDER: Lavinia Sharps, NP  REFERRING DIAG: Other reduced mobility  THERAPY DIAG:  Unsteadiness on feet  Muscle weakness (generalized)  Rationale for Evaluation and Treatment Rehabilitation  ONSET DATE: years ago   SUBJECTIVE:   SUBJECTIVE STATEMENT: No new complaints.  PERTINENT HISTORY: Chronic low back pain, OA, fibromyalgia, HTN, and multiple lumbar surgeries  PAIN:  Are you having pain? No  PRECAUTIONS: Fall  WEIGHT BEARING RESTRICTIONS No  PATIENT GOALS improved safety, gardening, be able to go to church (not going currently due to fear of falling)   OBJECTIVE:  LOWER EXTREMITY MMT:  MMT Right eval Left eval  Hip flexion 3/5 3/5  Hip extension    Hip abduction    Hip adduction    Hip internal rotation    Hip external rotation    Knee flexion 3/5 3/5  Knee extension 3/5 3/5  Ankle dorsiflexion 3/5 3/5  Ankle plantarflexion    Ankle inversion    Ankle eversion     (Blank rows = not tested)  FUNCTIONAL TESTS:  5 times sit to stand: 37.82 seconds with BUE support and uncontrolled descent  GAIT: Assistive device utilized: Quad cane small base Level of assistance: Modified independence Comments: decreased stride length, poor foot clearance, and decreased gait speed Recommended that patient bring her walker to her next appointment to  evaluate her safety with this assistive device  TODAY'S TREATMENT:                                   10/26 EXERCISE LOG  Exercise Repetitions and Resistance Comments  Nustep L2 x 15 minutes   Seated clams 3 minutes yellow   LAQ  AROM x30 reps   Seated marching  30 reps each AROM   Ball Squeeze 3 mins    Blank cell = exercise not performed today   PATIENT EDUCATION:  Education details: safety, fall risk  Person educated: Patient Education method: Explanation Education comprehension: verbalized understanding  HOME EXERCISE  PROGRAM:  ASSESSMENT:  CLINICAL IMPRESSION: Patient presented in clinic with reports of continued of doing some nustep workout at home. Patient has limited her time as she was experiencing soreness. Patient able to tolerate all therex well with limited resistance.   OBJECTIVE IMPAIRMENTS Abnormal gait, decreased activity tolerance, decreased balance, decreased mobility, difficulty walking, decreased strength, impaired sensation, and pain.   ACTIVITY LIMITATIONS carrying, lifting, bending, standing, squatting, stairs, transfers, toileting, and locomotion level  PARTICIPATION LIMITATIONS: meal prep, cleaning, laundry, shopping, and community activity  PERSONAL FACTORS Age, Fitness, Time since onset of injury/illness/exacerbation, Transportation, and 3+ comorbidities: Chronic low back pain, OA, fibromyalgia, HTN, and multiple lumbar surgeries  are also affecting patient's functional outcome.   REHAB POTENTIAL: Fair    CLINICAL DECISION MAKING: Unstable/unpredictable  EVALUATION COMPLEXITY: High   GOALS: Goals reviewed with patient? No LONG TERM GOALS: Target date: 11/07/2022   Patient will be independent with her HEP.  Baseline:  Goal status: INITIAL  2.  Patient will improve her five time sit to stand time to 25 seconds or less for improved lower extremity power.  Baseline:  Goal status: INITIAL  3.  Patient will improve her bilateral lower extremity strength to at least 3+/5 throughout for improved functional mobility. Baseline:  Goal status: INITIAL  PLAN: PT FREQUENCY: 2-3x/week  PT DURATION: 4 weeks  PLANNED INTERVENTIONS: Therapeutic exercises, Therapeutic activity, Neuromuscular re-education, Balance training, Gait training, Patient/Family education, Self Care, Stair training, and Re-evaluation  PLAN FOR NEXT SESSION: nustep, seated lower extremity strengthening   Marvell Fuller, PTA 10/30/2022, 2:15 PM

## 2022-11-01 ENCOUNTER — Encounter: Payer: Self-pay | Admitting: Physical Therapy

## 2022-11-01 ENCOUNTER — Ambulatory Visit: Payer: Medicare PPO | Attending: Nurse Practitioner | Admitting: Physical Therapy

## 2022-11-01 DIAGNOSIS — R2681 Unsteadiness on feet: Secondary | ICD-10-CM | POA: Insufficient documentation

## 2022-11-01 DIAGNOSIS — M6281 Muscle weakness (generalized): Secondary | ICD-10-CM | POA: Diagnosis present

## 2022-11-01 NOTE — Therapy (Signed)
OUTPATIENT PHYSICAL THERAPY LOWER EXTREMITY TREATMENT   Patient Name: Emily Mcbride MRN: [id number] DOB:02-25-1941, 81 y.o., female Today's Date: 11/01/2022   PT End of Session - 11/01/22 1316     Visit Number 7    Number of Visits 12    Date for PT Re-Evaluation 11/23/22    PT Start Time 1304    PT Stop Time 1348    PT Time Calculation (min) 44 min    Activity Tolerance Patient tolerated treatment well    Behavior During Therapy WFL for tasks assessed/performed            Past Medical History:  Diagnosis Date   Anemia    as a child   Arthritis    osteoarthritis   Chorioretinitis    Colitis    Fibromyalgia    GERD (gastroesophageal reflux disease)    no longer takes meds    Headache(784.0)    due to a fall several mos.   History of kidney stones    Hypertension    Rheumatic fever    as a child   Skipped heart beats    due to rheumatic fever as a child   Sleep apnea    does not use CPAP due discomfort   Past Surgical History:  Procedure Laterality Date   CARDIAC CATHETERIZATION     COLONOSCOPY     DILATION AND CURETTAGE OF UTERUS     LUMBAR LAMINECTOMY/DECOMPRESSION MICRODISCECTOMY Right 11/16/2013   Procedure: LUMBAR LAMINECTOMY/Laminotomy/DECOMPRESSION MICRODISCECTOMY RIGHT LUMBAR FOUR-FIVE;  Surgeon: Temple Pacini, MD;  Location: [facility];  Service: Neurosurgery;  Laterality: Right;  LUMBAR LAMINECTOMY/Laminotomy/DECOMPRESSION MICRODISCECTOMY RIGHT LUMBAR FOUR-FIVE   LUMBAR LAMINECTOMY/DECOMPRESSION MICRODISCECTOMY Right 10/09/2018   Procedure: LAMINECTOMY AND FORAMINOTOMY LUMBAR FIVE- SACRAL ONE;  Surgeon: Julio Sicks, MD;  Location: [facility];  Service: Neurosurgery;  Laterality: Right;  LAMINECTOMY AND FORAMINOTOMY LUMBAR FIVE- SACRAL ONE   TONSILLECTOMY     Patient Active Problem List   Diagnosis Date Noted   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   Lumbar foraminal stenosis 10/09/2018   Spinal stenosis, lumbar region, with neurogenic  claudication 11/16/2013   PCP: Rebecka Apley, NP  REFERRING PROVIDER: Lavinia Sharps, NP  REFERRING DIAG: Other reduced mobility  THERAPY DIAG:  Unsteadiness on feet  Muscle weakness (generalized)  Rationale for Evaluation and Treatment Rehabilitation  ONSET DATE: years ago   SUBJECTIVE:   SUBJECTIVE STATEMENT: No new complaints.  PERTINENT HISTORY: Chronic low back pain, OA, fibromyalgia, HTN, and multiple lumbar surgeries  PAIN:  Are you having pain? No  PRECAUTIONS: Fall  WEIGHT BEARING RESTRICTIONS No  PATIENT GOALS improved safety, gardening, be able to go to church (not going currently due to fear of falling)   OBJECTIVE:  LOWER EXTREMITY MMT:  MMT Right eval Left eval  Hip flexion 3/5 3/5  Hip extension    Hip abduction    Hip adduction    Hip internal rotation    Hip external rotation    Knee flexion 3/5 3/5  Knee extension 3/5 3/5  Ankle dorsiflexion 3/5 3/5  Ankle plantarflexion    Ankle inversion    Ankle eversion     (Blank rows = not tested)  FUNCTIONAL TESTS:  5 times sit to stand: 37.82 seconds with BUE support and uncontrolled descent  GAIT: Assistive device utilized: Quad cane small base Level of assistance: Modified independence Comments: decreased stride length, poor foot clearance, and decreased gait speed Recommended that patient bring her walker to her next appointment to  evaluate her safety with this assistive device  TODAY'S TREATMENT:                                   11/01/22 EXERCISE LOG  Exercise Repetitions and Resistance Comments  Nustep L3 x 18 minutes   Seated clams 3 minutes yellow   LAQ  2# x30 reps   Seated marching  30 reps each 2#   Ball Squeeze 3 mins   HS curls Yellow x20 reps   Heel raises X30 reps    Blank cell = exercise not performed today   PATIENT EDUCATION:  Education details: safety, fall risk  Person educated: Patient Education method: Explanation Education comprehension:  verbalized understanding  HOME EXERCISE PROGRAM:  ASSESSMENT:  CLINICAL IMPRESSION: Patient presented in clinic with no new complaints. Patient reporting her "normal" LBP which is not exaggerated by anything in PT but is chronic. Patient progressed to light ankle weights for LAQ and seated marching with no complaints.   OBJECTIVE IMPAIRMENTS Abnormal gait, decreased activity tolerance, decreased balance, decreased mobility, difficulty walking, decreased strength, impaired sensation, and pain.   ACTIVITY LIMITATIONS carrying, lifting, bending, standing, squatting, stairs, transfers, toileting, and locomotion level  PARTICIPATION LIMITATIONS: meal prep, cleaning, laundry, shopping, and community activity  PERSONAL FACTORS Age, Fitness, Time since onset of injury/illness/exacerbation, Transportation, and 3+ comorbidities: Chronic low back pain, OA, fibromyalgia, HTN, and multiple lumbar surgeries  are also affecting patient's functional outcome.   REHAB POTENTIAL: Fair    CLINICAL DECISION MAKING: Unstable/unpredictable  EVALUATION COMPLEXITY: High  GOALS:Goals reviewed with patient? No  LONG TERM GOALS: Target date: 11/07/2022   Patient will be independent with her HEP.  Baseline:  Goal status: INITIAL  2.  Patient will improve her five time sit to stand time to 25 seconds or less for improved lower extremity power.  Baseline:  Goal status: INITIAL  3.  Patient will improve her bilateral lower extremity strength to at least 3+/5 throughout for improved functional mobility. Baseline:  Goal status: INITIAL  PLAN: PT FREQUENCY: 2-3x/week  PT DURATION: 4 weeks  PLANNED INTERVENTIONS: Therapeutic exercises, Therapeutic activity, Neuromuscular re-education, Balance training, Gait training, Patient/Family education, Self Care, Stair training, and Re-evaluation  PLAN FOR NEXT SESSION: nustep, seated lower extremity strengthening  Marvell Fuller, PTA 11/01/2022, 1:58 PM

## 2022-11-06 ENCOUNTER — Ambulatory Visit: Payer: Medicare PPO | Admitting: Physical Therapy

## 2022-11-06 ENCOUNTER — Encounter: Payer: Self-pay | Admitting: Physical Therapy

## 2022-11-06 DIAGNOSIS — R2681 Unsteadiness on feet: Secondary | ICD-10-CM | POA: Diagnosis not present

## 2022-11-06 DIAGNOSIS — M6281 Muscle weakness (generalized): Secondary | ICD-10-CM

## 2022-11-06 NOTE — Therapy (Signed)
OUTPATIENT PHYSICAL THERAPY LOWER EXTREMITY TREATMENT   Patient Name: Emily Mcbride MRN: [id number] DOB:06-Aug-1941, 81 y.o., female Today's Date: 11/06/2022   PT End of Session - 11/06/22 1311     Visit Number 8    Number of Visits 12    Date for PT Re-Evaluation 11/23/22    PT Start Time 1302    PT Stop Time 1342    PT Time Calculation (min) 40 min    Activity Tolerance Patient tolerated treatment well    Behavior During Therapy WFL for tasks assessed/performed            Past Medical History:  Diagnosis Date   Anemia    as a child   Arthritis    osteoarthritis   Chorioretinitis    Colitis    Fibromyalgia    GERD (gastroesophageal reflux disease)    no longer takes meds    Headache(784.0)    due to a fall several mos.   History of kidney stones    Hypertension    Rheumatic fever    as a child   Skipped heart beats    due to rheumatic fever as a child   Sleep apnea    does not use CPAP due discomfort   Past Surgical History:  Procedure Laterality Date   CARDIAC CATHETERIZATION     COLONOSCOPY     DILATION AND CURETTAGE OF UTERUS     LUMBAR LAMINECTOMY/DECOMPRESSION MICRODISCECTOMY Right 11/16/2013   Procedure: LUMBAR LAMINECTOMY/Laminotomy/DECOMPRESSION MICRODISCECTOMY RIGHT LUMBAR FOUR-FIVE;  Surgeon: Temple Pacini, MD;  Location: [facility];  Service: Neurosurgery;  Laterality: Right;  LUMBAR LAMINECTOMY/Laminotomy/DECOMPRESSION MICRODISCECTOMY RIGHT LUMBAR FOUR-FIVE   LUMBAR LAMINECTOMY/DECOMPRESSION MICRODISCECTOMY Right 10/09/2018   Procedure: LAMINECTOMY AND FORAMINOTOMY LUMBAR FIVE- SACRAL ONE;  Surgeon: Julio Sicks, MD;  Location: [facility];  Service: Neurosurgery;  Laterality: Right;  LAMINECTOMY AND FORAMINOTOMY LUMBAR FIVE- SACRAL ONE   TONSILLECTOMY     Patient Active Problem List   Diagnosis Date Noted   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   Lumbar foraminal stenosis 10/09/2018   Spinal stenosis, lumbar region, with neurogenic  claudication 11/16/2013   PCP: Rebecka Apley, NP  REFERRING PROVIDER: Lavinia Sharps, NP  REFERRING DIAG: Other reduced mobility  THERAPY DIAG:  Unsteadiness on feet  Muscle weakness (generalized)  Rationale for Evaluation and Treatment Rehabilitation  ONSET DATE: years ago   SUBJECTIVE:   SUBJECTIVE STATEMENT: No new complaints.  PERTINENT HISTORY: Chronic low back pain, OA, fibromyalgia, HTN, and multiple lumbar surgeries  PAIN:  Are you having pain? No  PRECAUTIONS: Fall  WEIGHT BEARING RESTRICTIONS No  PATIENT GOALS improved safety, gardening, be able to go to church (not going currently due to fear of falling)   OBJECTIVE:  LOWER EXTREMITY MMT:  MMT Right eval Left eval  Hip flexion 3/5 3/5  Hip extension    Hip abduction    Hip adduction    Hip internal rotation    Hip external rotation    Knee flexion 3/5 3/5  Knee extension 3/5 3/5  Ankle dorsiflexion 3/5 3/5  Ankle plantarflexion    Ankle inversion    Ankle eversion     (Blank rows = not tested)  FUNCTIONAL TESTS:  5 times sit to stand: 37.82 seconds with BUE support and uncontrolled descent  GAIT: Assistive device utilized: Quad cane small base Level of assistance: Modified independence Comments: decreased stride length, poor foot clearance, and decreased gait speed Recommended that patient bring her walker to her next appointment to  evaluate her safety with this assistive device  TODAY'S TREATMENT:                                   11/01/22 EXERCISE LOG  Exercise Repetitions and Resistance Comments  Nustep L3 x 18 minutes   LAQ AROM x30 reps   Seated marching  AROM x30 reps   Heel raises X30 reps    Blank cell = exercise not performed today   PATIENT EDUCATION:  Education details: safety, fall risk  Person educated: Patient Education method: Explanation Education comprehension: verbalized understanding  HOME EXERCISE PROGRAM:  ASSESSMENT:  CLINICAL  IMPRESSION: Patient presented in clinic with greater LBP and hip pain after bending to cleaning behind toilets in her home. Patient overall not feeling very well today. Today's treatment completed at much slower pace without resistance. Rest breaks were highly encouraged to not limit patient more based on pain. Patient had no complaints following end of treatment.  OBJECTIVE IMPAIRMENTS Abnormal gait, decreased activity tolerance, decreased balance, decreased mobility, difficulty walking, decreased strength, impaired sensation, and pain.   ACTIVITY LIMITATIONS carrying, lifting, bending, standing, squatting, stairs, transfers, toileting, and locomotion level  PARTICIPATION LIMITATIONS: meal prep, cleaning, laundry, shopping, and community activity  PERSONAL FACTORS Age, Fitness, Time since onset of injury/illness/exacerbation, Transportation, and 3+ comorbidities: Chronic low back pain, OA, fibromyalgia, HTN, and multiple lumbar surgeries  are also affecting patient's functional outcome.   REHAB POTENTIAL: Fair    CLINICAL DECISION MAKING: Unstable/unpredictable  EVALUATION COMPLEXITY: High  GOALS:Goals reviewed with patient? No  LONG TERM GOALS: Target date: 11/07/2022   Patient will be independent with her HEP.  Baseline:  Goal status: INITIAL  2.  Patient will improve her five time sit to stand time to 25 seconds or less for improved lower extremity power.  Baseline:  Goal status: INITIAL  3.  Patient will improve her bilateral lower extremity strength to at least 3+/5 throughout for improved functional mobility. Baseline:  Goal status: INITIAL  PLAN: PT FREQUENCY: 2-3x/week  PT DURATION: 4 weeks  PLANNED INTERVENTIONS: Therapeutic exercises, Therapeutic activity, Neuromuscular re-education, Balance training, Gait training, Patient/Family education, Self Care, Stair training, and Re-evaluation  PLAN FOR NEXT SESSION: nustep, seated lower extremity strengthening  Marvell Fuller, PTA 11/06/2022, 1:56 PM

## 2022-11-08 ENCOUNTER — Encounter: Payer: Self-pay | Admitting: Physical Therapy

## 2022-11-08 ENCOUNTER — Ambulatory Visit: Payer: Medicare PPO | Admitting: Physical Therapy

## 2022-11-08 DIAGNOSIS — M6281 Muscle weakness (generalized): Secondary | ICD-10-CM

## 2022-11-08 DIAGNOSIS — R2681 Unsteadiness on feet: Secondary | ICD-10-CM

## 2022-11-08 NOTE — Therapy (Addendum)
OUTPATIENT PHYSICAL THERAPY LOWER EXTREMITY TREATMENT   Patient Name: Emily Mcbride MRN: [id number] DOB:03/14/1941, 81 y.o., female Today's Date: 11/08/2022   PT End of Session - 11/08/22 1319     Visit Number 9    Number of Visits 12    Date for PT Re-Evaluation 11/23/22    PT Start Time 1305    PT Stop Time 1342    PT Time Calculation (min) 37 min    Activity Tolerance Patient tolerated treatment well    Behavior During Therapy WFL for tasks assessed/performed            Past Medical History:  Diagnosis Date   Anemia    as a child   Arthritis    osteoarthritis   Chorioretinitis    Colitis    Fibromyalgia    GERD (gastroesophageal reflux disease)    no longer takes meds    Headache(784.0)    due to a fall several mos.   History of kidney stones    Hypertension    Rheumatic fever    as a child   Skipped heart beats    due to rheumatic fever as a child   Sleep apnea    does not use CPAP due discomfort   Past Surgical History:  Procedure Laterality Date   CARDIAC CATHETERIZATION     COLONOSCOPY     DILATION AND CURETTAGE OF UTERUS     LUMBAR LAMINECTOMY/DECOMPRESSION MICRODISCECTOMY Right 11/16/2013   Procedure: LUMBAR LAMINECTOMY/Laminotomy/DECOMPRESSION MICRODISCECTOMY RIGHT LUMBAR FOUR-FIVE;  Surgeon: Temple Pacini, MD;  Location: [facility];  Service: Neurosurgery;  Laterality: Right;  LUMBAR LAMINECTOMY/Laminotomy/DECOMPRESSION MICRODISCECTOMY RIGHT LUMBAR FOUR-FIVE   LUMBAR LAMINECTOMY/DECOMPRESSION MICRODISCECTOMY Right 10/09/2018   Procedure: LAMINECTOMY AND FORAMINOTOMY LUMBAR FIVE- SACRAL ONE;  Surgeon: Julio Sicks, MD;  Location: [facility];  Service: Neurosurgery;  Laterality: Right;  LAMINECTOMY AND FORAMINOTOMY LUMBAR FIVE- SACRAL ONE   TONSILLECTOMY     Patient Active Problem List   Diagnosis Date Noted   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   Lumbar foraminal stenosis 10/09/2018   Spinal stenosis, lumbar region, with neurogenic  claudication 11/16/2013   PCP: Rebecka Apley, NP  REFERRING PROVIDER: Lavinia Sharps, NP  REFERRING DIAG: Other reduced mobility  THERAPY DIAG:  Unsteadiness on feet  Muscle weakness (generalized)  Rationale for Evaluation and Treatment Rehabilitation  ONSET DATE: years ago   SUBJECTIVE:   SUBJECTIVE STATEMENT: Would like to discharge today as they are busy in preparing for trip to [place].  PERTINENT HISTORY: Chronic low back pain, OA, fibromyalgia, HTN, and multiple lumbar surgeries  PAIN:  Are you having pain? No  PRECAUTIONS: Fall  WEIGHT BEARING RESTRICTIONS No  PATIENT GOALS improved safety, gardening, be able to go to church (not going currently due to fear of falling)   OBJECTIVE:  LOWER EXTREMITY MMT:  MMT Right eval Left eval Right 11/9 Lrft 11/9  Hip flexion 3/5 3/5 3/5 3/5  Hip extension      Hip abduction      Hip adduction      Hip internal rotation      Hip external rotation      Knee flexion 3/5 3/5    Knee extension 3/5 3/5 3+/5 3+/5  Ankle dorsiflexion 3/5 3/5    Ankle plantarflexion      Ankle inversion      Ankle eversion       (Blank rows = not tested)  Knee flexor testing avoided due to pain. Knee extensor testing may be  affected by sensitivity and pain to pressure in patient's B lower legs.  FUNCTIONAL TESTS:  5 times sit to stand: 25 seconds with BUE support and uncontrolled descent  GAIT: Assistive device utilized: FWW Level of assistance: Modified independence Comments: decreased stride length, poor foot clearance, and decreased gait speed Recommended that patient bring her walker to her next appointment to evaluate her safety with this assistive device  TODAY'S TREATMENT:                                   11/01/22 EXERCISE LOG  Exercise Repetitions and Resistance Comments  Nustep L3 x 18 minutes   Sit to stands X5 reps with UE support 30 seconds  LAQ AROM x30 reps    Blank cell = exercise not  performed today   PATIENT EDUCATION:  Education details: safety, fall risk  Person educated: Patient Education method: Explanation Education comprehension: verbalized understanding  HOME EXERCISE PROGRAM:  ASSESSMENT:  CLINICAL IMPRESSION: Patient opting to discharge from PT as their annual trip to [place] is approaching and they have a lot of packing. Patient reports she is sensitive to pressure or palpation along B lower legs which may limit the tolerance for MMT. Patient's sit to stand time improved but still requires UE support and increased time.   PHYSICAL THERAPY DISCHARGE SUMMARY  Visits from Start of Care: 9  Current functional level related to goals / functional outcomes: Patient was unable to meet her goals for skilled physical therapy.  She requested to be discharged at this time due to her upcoming trip to [place].   Remaining deficits: Lower extremity weakness and power   Education / Equipment: HEP  Patient agrees to discharge. Patient goals were not met. Patient is being discharged due to the patient's request.  Candi Leash, PT, DPT    OBJECTIVE IMPAIRMENTS Abnormal gait, decreased activity tolerance, decreased balance, decreased mobility, difficulty walking, decreased strength, impaired sensation, and pain.   ACTIVITY LIMITATIONS carrying, lifting, bending, standing, squatting, stairs, transfers, toileting, and locomotion level  PARTICIPATION LIMITATIONS: meal prep, cleaning, laundry, shopping, and community activity  PERSONAL FACTORS Age, Fitness, Time since onset of injury/illness/exacerbation, Transportation, and 3+ comorbidities: Chronic low back pain, OA, fibromyalgia, HTN, and multiple lumbar surgeries  are also affecting patient's functional outcome.   REHAB POTENTIAL: Fair    CLINICAL DECISION MAKING: Unstable/unpredictable  EVALUATION COMPLEXITY: High  GOALS:Goals reviewed with patient? No  LONG TERM GOALS: Target date: 11/07/2022   Patient  will be independent with her HEP.  Baseline:  Goal status: Unable to assess  2.  Patient will improve her five time sit to stand time to 25 seconds or less for improved lower extremity power.  Baseline:  Goal status: NOT MET  3.  Patient will improve her bilateral lower extremity strength to at least 3+/5 throughout for improved functional mobility. Baseline:  Goal status: Partially met  PLAN: PT FREQUENCY: 2-3x/week  PT DURATION: 4 weeks  PLANNED INTERVENTIONS: Therapeutic exercises, Therapeutic activity, Neuromuscular re-education, Balance training, Gait training, Patient/Family education, Self Care, Stair training, and Re-evaluation  PLAN FOR NEXT SESSION: DC  Marvell Fuller, PTA 11/08/2022, 3:54 PM

## 2023-09-27 ENCOUNTER — Other Ambulatory Visit: Payer: Self-pay | Admitting: Neurosurgery

## 2023-09-27 DIAGNOSIS — M48062 Spinal stenosis, lumbar region with neurogenic claudication: Secondary | ICD-10-CM

## 2023-09-30 ENCOUNTER — Other Ambulatory Visit: Payer: Self-pay | Admitting: Neurosurgery

## 2023-10-01 NOTE — Discharge Instructions (Signed)

## 2023-10-02 ENCOUNTER — Ambulatory Visit
Admission: RE | Admit: 2023-10-02 | Discharge: 2023-10-02 | Disposition: A | Discharge status: Home / Self Care | Payer: Medicare PPO | Source: Ambulatory Visit | Attending: Neurosurgery | Admitting: Neurosurgery

## 2023-10-02 ENCOUNTER — Inpatient Hospital Stay
Admission: RE | Admit: 2023-10-02 | Discharge: 2023-10-02 | Disposition: A | Discharge status: Home / Self Care | Payer: Medicare PPO | Source: Ambulatory Visit | Attending: Neurosurgery | Admitting: Neurosurgery

## 2023-10-02 DIAGNOSIS — M48062 Spinal stenosis, lumbar region with neurogenic claudication: Secondary | ICD-10-CM

## 2023-10-02 MED ORDER — IOPAMIDOL (ISOVUE-M 200) INJECTION 41%
18.0000 mL | Freq: Once | INTRAMUSCULAR | Status: AC
  Administered 2023-10-02: 18 mL via INTRATHECAL

## 2023-10-02 MED ORDER — MEPERIDINE HCL 50 MG/ML IJ SOLN
50.0000 mg | Freq: Once | INTRAMUSCULAR | Status: AC | PRN
  Administered 2023-10-02: 50 mg via INTRAMUSCULAR

## 2023-10-02 MED ORDER — ONDANSETRON HCL 4 MG/2ML IJ SOLN
4.0000 mg | Freq: Once | INTRAMUSCULAR | Status: AC | PRN
  Administered 2023-10-02: 4 mg via INTRAMUSCULAR

## 2023-10-02 MED ORDER — DIAZEPAM 5 MG PO TABS
5.0000 mg | ORAL_TABLET | Freq: Once | ORAL | Status: AC
  Administered 2023-10-02: 5 mg via ORAL

## 2023-10-02 NOTE — Discharge Instr - Other Info (Signed)
1048: Dr Charise Killian was speaking to pt prior to starting procedure and pt stated her pain was 8/10. Dr. Charise Killian ordered to give demerol and zofran prior to starting myelogram procedure. See MAR.

## 2023-10-15 ENCOUNTER — Other Ambulatory Visit: Payer: Self-pay | Admitting: Neurosurgery

## 2023-10-21 NOTE — Pre-Procedure Instructions (Signed)
Surgical Instructions   Your procedure is scheduled on October 24, 2023. Report to [facility] Main Entrance "A" at 6:00 A.M., then check in with the Admitting office. Any questions or running late day of surgery: call [phone]  Questions prior to your surgery date: call [phone], Monday-Friday, 8am-4pm. If you experience any cold or flu symptoms such as cough, fever, chills, shortness of breath, etc. between now and your scheduled surgery, please notify us at the above number.     Remember:  Do not eat or drink after midnight the night before your surgery    Take these medicines the morning of surgery with A SIP OF WATER: acetaminophen (TYLENOL)  mesalamine (APRISO)  metoprolol succinate (TOPROL-XL)  pregabalin (LYRICA)  sertraline (ZOLOFT)    May take these medicines IF NEEDED: HYDROcodone-acetaminophen (NORCO)  Polyethyl Glycol-Propyl Glycol (SYSTANE OP) eye drops   Follow your surgeon's instructions on when to stop Asprin.  If no instructions were given by your surgeon then you will need to call the office to get those instructions.     One week prior to surgery, STOP taking any Aleve, Naproxen, Ibuprofen, Motrin, Advil, Goody's, BC's, all herbal medications, fish oil, and non-prescription vitamins. This includes your medication: meloxicam (MOBIC)                      Do NOT Smoke (Tobacco/Vaping) for 24 hours prior to your procedure.  If you use a CPAP at night, you may bring your mask/headgear for your overnight stay.   You will be asked to remove any contacts, glasses, piercing's, hearing aid's, dentures/partials prior to surgery. Please bring cases for these items if needed.    Patients discharged the day of surgery will not be allowed to drive home, and someone needs to stay with them for 24 hours.  SURGICAL WAITING ROOM VISITATION Patients may have no more than 2 support people in the waiting area - these visitors may rotate.   Pre-op nurse will coordinate  an appropriate time for 1 ADULT support person, who may not rotate, to accompany patient in pre-op.  Children under the age of 93 must have an adult with them who is not the patient and must remain in the main waiting area with an adult.  If the patient needs to stay at the hospital during part of their recovery, the visitor guidelines for inpatient rooms apply.  Please refer to the [facility] website for the visitor guidelines for any additional information.   If you received a COVID test during your pre-op visit  it is requested that you wear a mask when out in public, stay away from anyone that may not be feeling well and notify your surgeon if you develop symptoms. If you have been in contact with anyone that has tested positive in the last 10 days please notify you surgeon.      Pre-operative 5 CHG Bathing Instructions   You can play a key role in reducing the risk of infection after surgery. Your skin needs to be as free of germs as possible. You can reduce the number of germs on your skin by washing with CHG (chlorhexidine gluconate) soap before surgery. CHG is an antiseptic soap that kills germs and continues to kill germs even after washing.   DO NOT use if you have an allergy to chlorhexidine/CHG or antibacterial soaps. If your skin becomes reddened or irritated, stop using the CHG and notify one of our RNs at [phone].   Please  shower with the CHG soap starting 4 days before surgery using the following schedule:     Please keep in mind the following:  DO NOT shave, including legs and underarms, starting the day of your first shower.   You may shave your face at any point before/day of surgery.  Place clean sheets on your bed the day you start using CHG soap. Use a clean washcloth (not used since being washed) for each shower. DO NOT sleep with pets once you start using the CHG.   CHG Shower Instructions:  Wash your face and private area with normal soap. If you choose  to wash your hair, wash first with your normal shampoo.  After you use shampoo/soap, rinse your hair and body thoroughly to remove shampoo/soap residue.  Turn the water OFF and apply about 3 tablespoons (45 ml) of CHG soap to a CLEAN washcloth.  Apply CHG soap ONLY FROM YOUR NECK DOWN TO YOUR TOES (washing for 3-5 minutes)  DO NOT use CHG soap on face, private areas, open wounds, or sores.  Pay special attention to the area where your surgery is being performed.  If you are having back surgery, having someone wash your back for you may be helpful. Wait 2 minutes after CHG soap is applied, then you may rinse off the CHG soap.  Pat dry with a clean towel  Put on clean clothes/pajamas   If you choose to wear lotion, please use ONLY the CHG-compatible lotions on the back of this paper.   Additional instructions for the day of surgery: DO NOT APPLY any lotions, deodorants, cologne, or perfumes.   Do not bring valuables to the hospital. [facility] is not responsible for any belongings/valuables. Do not wear nail polish, gel polish, artificial nails, or any other type of covering on natural nails (fingers and toes) Do not wear jewelry or makeup Put on clean/comfortable clothes.  Please brush your teeth.  Ask your nurse before applying any prescription medications to the skin.     CHG Compatible Lotions   Aveeno Moisturizing lotion  Cetaphil Moisturizing Cream  Cetaphil Moisturizing Lotion  Clairol Herbal Essence Moisturizing Lotion, Dry Skin  Clairol Herbal Essence Moisturizing Lotion, Extra Dry Skin  Clairol Herbal Essence Moisturizing Lotion, Normal Skin  Curel Age Defying Therapeutic Moisturizing Lotion with Alpha Hydroxy  Curel Extreme Care Body Lotion  Curel Soothing Hands Moisturizing Hand Lotion  Curel Therapeutic Moisturizing Cream, Fragrance-Free  Curel Therapeutic Moisturizing Lotion, Fragrance-Free  Curel Therapeutic Moisturizing Lotion, Original Formula  Eucerin Daily  Replenishing Lotion  Eucerin Dry Skin Therapy Plus Alpha Hydroxy Crme  Eucerin Dry Skin Therapy Plus Alpha Hydroxy Lotion  Eucerin Original Crme  Eucerin Original Lotion  Eucerin Plus Crme Eucerin Plus Lotion  Eucerin TriLipid Replenishing Lotion  Keri Anti-Bacterial Hand Lotion  Keri Deep Conditioning Original Lotion Dry Skin Formula Softly Scented  Keri Deep Conditioning Original Lotion, Fragrance Free Sensitive Skin Formula  Keri Lotion Fast Absorbing Fragrance Free Sensitive Skin Formula  Keri Lotion Fast Absorbing Softly Scented Dry Skin Formula  Keri Original Lotion  Keri Skin Renewal Lotion Keri Silky Smooth Lotion  Keri Silky Smooth Sensitive Skin Lotion  Nivea Body Creamy Conditioning Oil  Nivea Body Extra Enriched Teacher, adult education Moisturizing Lotion Nivea Crme  Nivea Skin Firming Lotion  NutraDerm 30 Skin Lotion  NutraDerm Skin Lotion  NutraDerm Therapeutic Skin Cream  NutraDerm Therapeutic Skin Lotion  ProShield Protective Hand Cream  Provon moisturizing lotion  Please read over the following fact sheets that you were given.

## 2023-10-22 ENCOUNTER — Inpatient Hospital Stay (HOSPITAL_COMMUNITY)
Admission: RE | Admit: 2023-10-22 | Discharge: 2023-10-22 | Disposition: A | Discharge status: Home / Self Care | Payer: Medicare PPO | Source: Ambulatory Visit | Attending: Nurse Practitioner | Admitting: Nurse Practitioner

## 2023-10-22 ENCOUNTER — Encounter (HOSPITAL_COMMUNITY): Payer: Self-pay

## 2023-10-22 ENCOUNTER — Other Ambulatory Visit: Payer: Self-pay

## 2023-10-22 VITALS — BP 145/78 | HR 62 | Temp 98.5°F | Resp 18 | Ht 61.0 in | Wt 217.0 lb

## 2023-10-22 DIAGNOSIS — I2510 Atherosclerotic heart disease of native coronary artery without angina pectoris: Secondary | ICD-10-CM | POA: Insufficient documentation

## 2023-10-22 DIAGNOSIS — Z01818 Encounter for other preprocedural examination: Secondary | ICD-10-CM | POA: Diagnosis present

## 2023-10-22 HISTORY — DX: Anxiety disorder, unspecified: F41.9

## 2023-10-22 LAB — CBC
HCT: 37 % (ref 36.0–46.0)
Hemoglobin: 11.8 g/dL — ABNORMAL LOW (ref 12.0–15.0)
MCH: 31.7 pg (ref 26.0–34.0)
MCHC: 31.9 g/dL (ref 30.0–36.0)
MCV: 99.5 fL (ref 80.0–100.0)
Platelets: 138 10*3/uL — ABNORMAL LOW (ref 150–400)
RBC: 3.72 MIL/uL — ABNORMAL LOW (ref 3.87–5.11)
RDW: 12.5 % (ref 11.5–15.5)
WBC: 5.5 10*3/uL (ref 4.0–10.5)
nRBC: 0 % (ref 0.0–0.2)

## 2023-10-22 LAB — BASIC METABOLIC PANEL
Anion gap: 8 (ref 5–15)
BUN: 22 mg/dL (ref 8–23)
CO2: 26 mmol/L (ref 22–32)
Calcium: 9.2 mg/dL (ref 8.9–10.3)
Chloride: 106 mmol/L (ref 98–111)
Creatinine, Ser: 1.12 mg/dL — ABNORMAL HIGH (ref 0.44–1.00)
GFR, Estimated: 49 mL/min — ABNORMAL LOW (ref >60)
Glucose, Bld: 100 mg/dL — ABNORMAL HIGH (ref 70–99)
Potassium: 4.2 mmol/L (ref 3.5–5.1)
Sodium: 140 mmol/L (ref 135–145)

## 2023-10-22 LAB — SURGICAL PCR SCREEN
MRSA, PCR: NEGATIVE
Staphylococcus aureus: NEGATIVE

## 2023-10-22 NOTE — Progress Notes (Signed)
PCP - Rebecka Apley, NP  Cardiologist - denies  PPM/ICD - denies  Pt has spinal cord stimulator- pt asked to bring remote with her on the day of surgery.   Chest x-ray - N/A EKG - 10/22/2023  Stress Test - "years ago"- normal per patient ECHO - denies Cardiac Cath - 2005 (listen in charge scanned in 05/16/2011)  Sleep Study - pt states she does not use CPAP at home.    Fasting Blood Sugar - N/A  Last dose of GLP1 agonist-  N/A   Blood Thinner Instructions: N/A Aspirin Instructions: Pt states that she thinks her last dose of ASA was 10/18/23  ERAS Protcol - NPO order   COVID TEST-  N/A   Anesthesia review: yes, review ekg  Patient denies shortness of breath, fever, cough and chest pain at PAT appointment   All instructions explained to the patient, with a verbal understanding of the material. Patient agrees to go over the instructions while at home for a better understanding. Patient also instructed to self quarantine after being tested for COVID-19. The opportunity to ask questions was provided.

## 2023-10-23 NOTE — Anesthesia Preprocedure Evaluation (Signed)
Anesthesia Evaluation  Patient identified by MRN, date of birth, ID band Patient awake    Reviewed: Allergy & Precautions, H&P , NPO status , Patient's Chart, lab work & pertinent test results  Airway Mallampati: II   Neck ROM: full    Dental   Pulmonary sleep apnea    breath sounds clear to auscultation       Cardiovascular hypertension,  Rhythm:regular Rate:Normal     Neuro/Psych  Headaches  Anxiety      Neuromuscular disease    GI/Hepatic ,GERD  ,,  Endo/Other    Morbid obesity  Renal/GU stones     Musculoskeletal  (+) Arthritis ,  Fibromyalgia -  Abdominal   Peds  Hematology   Anesthesia Other Findings   Reproductive/Obstetrics                             Anesthesia Physical Anesthesia Plan  ASA: 3  Anesthesia Plan: General   Post-op Pain Management:    Induction: Intravenous  PONV Risk Score and Plan: 3 and Ondansetron, Dexamethasone and Treatment may vary due to age or medical condition  Airway Management Planned: Oral ETT  Additional Equipment:   Intra-op Plan:   Post-operative Plan: Extubation in OR  Informed Consent: I have reviewed the patients History and Physical, chart, labs and discussed the procedure including the risks, benefits and alternatives for the proposed anesthesia with the patient or authorized representative who has indicated his/her understanding and acceptance.     Dental advisory given  Plan Discussed with: CRNA, Anesthesiologist and Surgeon  Anesthesia Plan Comments: (PAT note written 10/23/2023 by Shonna Chock, PA-C.  )       Anesthesia Quick Evaluation

## 2023-10-23 NOTE — Progress Notes (Signed)
Anesthesia Chart Review:   Case: [id number] Date/Time: 10/24/23 0745   Procedure: Laminectomy and Foraminotomy - right - L3-L4 - L4-L5 (Right: Back) - 3C   Anesthesia type: General   Pre-op diagnosis: Stenosis   [facility] ROOM [facility] / [facility]   Surgeons: Julio Sicks, MD       DISCUSSION: Patient is an 82 year old female scheduled for the above procedure.  History includes never smoker, HTN, OSA, (CPAP intolerant), fibromyalgia, rheumatic fever, skipped heart beats, chorioretinitis, childhood anemia, osteoarthritis, spinal surgery (right L4-5 laminotomy/foraminotomy 11/17/23 & right L5-S1 10/09/18; L5_S1 PLIF/posterolateral arthrodesis 06/22/19; [employer] SCS implant 12/15/21, s/p revision/IPG replacement 05/31/23). Normal coronaries in 2005.  BMI is consistent with morbid obesity.  She was asked to bring her spinal cord stimulator remote with her on the day of surgery.  Anesthesia team to evaluate on the day of surgery. Last ASA was ~ 10/18/23.    VS: BP (!) 145/78   Pulse 62   Temp 36.9 C   Resp 18   Ht 5\' 1"  (1.549 m)   Wt 98.4 kg   SpO2 97%   BMI 41.00 kg/m   PROVIDERS: Hemberg, Ruby Cola, NP is PCP    LABS: Labs reviewed: Acceptable for surgery. (all labs ordered are listed, but only abnormal results are displayed)  Labs Reviewed  BASIC METABOLIC PANEL - Abnormal; Notable for the following components:      Result Value   Glucose, Bld 100 (*)    Creatinine, Ser 1.12 (*)    GFR, Estimated 49 (*)    All other components within normal limits  CBC - Abnormal; Notable for the following components:   RBC 3.72 (*)    Hemoglobin 11.8 (*)    Platelets 138 (*)    All other components within normal limits  SURGICAL PCR SCREEN     IMAGES: CT L-spine with myelogram 10/02/23: IMPRESSION: 1. Progressive multilevel lumbar spondylosis as described above. Moderate to severe spinal canal and right lateral recess stenosis at L3-L4. 2. Moderate spinal canal and left  neuroforaminal stenosis at L2-L3. 3. Severe right and moderate to severe left neuroforaminal stenosis at L4-L5. 4. Prior L5-S1 PLIF with pseudoarthrosis, interbody graft subsidence, and bilateral S1 screw loosening. 5. Nonobstructive left nephrolithiasis. 6.  Aortic Atherosclerosis (ICD10-I70.0).    EKG: EKG 10/22/23: Sinus bradycardia at 58 bpm Low voltage QRS Nonspecific T wave abnormality Abnormal ECG When compared with ECG of 01-Oct-2018 12:06, No significant change since last tracing Confirmed by Riley Lam ([id number]) on 10/22/2023 9:39:32 PM   CV: Cardiac cath 02/04/04:  OVERALL IMPRESSION:  1. Normal left ventricular function.  2. Normal coronary arteries.   Past Medical History:  Diagnosis Date   Anemia    as a child   Anxiety    Arthritis    osteoarthritis   Chorioretinitis    Colitis    Fibromyalgia    GERD (gastroesophageal reflux disease)    no longer takes meds    Headache(784.0)    due to a fall several mos.   History of kidney stones    Hypertension    Rheumatic fever    as a child   Skipped heart beats    due to rheumatic fever as a child   Sleep apnea    does not use CPAP due discomfort    Past Surgical History:  Procedure Laterality Date   CARDIAC CATHETERIZATION     2005   COLONOSCOPY     DILATION AND CURETTAGE OF UTERUS  LUMBAR LAMINECTOMY/DECOMPRESSION MICRODISCECTOMY Right 11/16/2013   Procedure: LUMBAR LAMINECTOMY/Laminotomy/DECOMPRESSION MICRODISCECTOMY RIGHT LUMBAR FOUR-FIVE;  Surgeon: Temple Pacini, MD;  Location: [facility];  Service: Neurosurgery;  Laterality: Right;  LUMBAR LAMINECTOMY/Laminotomy/DECOMPRESSION MICRODISCECTOMY RIGHT LUMBAR FOUR-FIVE   LUMBAR LAMINECTOMY/DECOMPRESSION MICRODISCECTOMY Right 10/09/2018   Procedure: LAMINECTOMY AND FORAMINOTOMY LUMBAR FIVE- SACRAL ONE;  Surgeon: Julio Sicks, MD;  Location: [facility] OR;  Service: Neurosurgery;  Laterality: Right;  LAMINECTOMY AND FORAMINOTOMY LUMBAR FIVE- SACRAL  ONE   SPINAL CORD STIMULATOR INSERTION     TONSILLECTOMY      MEDICATIONS:  acetaminophen (TYLENOL) 650 MG CR tablet   aspirin EC 81 MG tablet   HYDROcodone-acetaminophen (NORCO) 10-325 MG tablet   meloxicam (MOBIC) 15 MG tablet   Menthol, Topical Analgesic, (BLUE-EMU MAXIMUM STRENGTH EX)   mesalamine (APRISO) 0.375 g 24 hr capsule   metoprolol succinate (TOPROL-XL) 50 MG 24 hr tablet   Multiple Vitamin (MULTIVITAMIN WITH MINERALS) TABS tablet   Polyethyl Glycol-Propyl Glycol (SYSTANE OP)   pregabalin (LYRICA) 150 MG capsule   rosuvastatin (CRESTOR) 5 MG tablet   sertraline (ZOLOFT) 50 MG tablet   No current facility-administered medications for this encounter.    Emily Chock, PA-C Surgical Short Stay/Anesthesiology [facility] Phone [phone] [facility] Phone [phone] 10/23/2023 12:50 PM

## 2023-10-24 ENCOUNTER — Ambulatory Visit (HOSPITAL_COMMUNITY)
Admission: RE | Disposition: A | Discharge status: Skilled Nursing Facility | Payer: Self-pay | Source: Home / Self Care | Attending: Neurosurgery

## 2023-10-24 ENCOUNTER — Ambulatory Visit (HOSPITAL_BASED_OUTPATIENT_CLINIC_OR_DEPARTMENT_OTHER): Payer: Medicare PPO | Admitting: Certified Registered Nurse Anesthetist

## 2023-10-24 ENCOUNTER — Encounter (HOSPITAL_COMMUNITY): Payer: Self-pay | Admitting: Neurosurgery

## 2023-10-24 ENCOUNTER — Ambulatory Visit (HOSPITAL_COMMUNITY): Payer: Medicare PPO

## 2023-10-24 ENCOUNTER — Inpatient Hospital Stay (HOSPITAL_COMMUNITY)
Admission: RE | Admit: 2023-10-24 | Discharge: 2023-11-06 | DRG: 516 | Disposition: A | Discharge status: Skilled Nursing Facility | Payer: Medicare PPO | Attending: Neurosurgery | Admitting: Neurosurgery

## 2023-10-24 ENCOUNTER — Other Ambulatory Visit: Payer: Self-pay

## 2023-10-24 ENCOUNTER — Ambulatory Visit (HOSPITAL_COMMUNITY): Payer: Medicare PPO | Admitting: Certified Registered Nurse Anesthetist

## 2023-10-24 DIAGNOSIS — I10 Essential (primary) hypertension: Secondary | ICD-10-CM | POA: Diagnosis present

## 2023-10-24 DIAGNOSIS — Z9102 Food additives allergy status: Secondary | ICD-10-CM

## 2023-10-24 DIAGNOSIS — Z79899 Other long term (current) drug therapy: Secondary | ICD-10-CM

## 2023-10-24 DIAGNOSIS — Z7409 Other reduced mobility: Secondary | ICD-10-CM | POA: Diagnosis present

## 2023-10-24 DIAGNOSIS — Z882 Allergy status to sulfonamides status: Secondary | ICD-10-CM

## 2023-10-24 DIAGNOSIS — M48061 Spinal stenosis, lumbar region without neurogenic claudication: Principal | ICD-10-CM | POA: Diagnosis present

## 2023-10-24 DIAGNOSIS — Z6841 Body Mass Index (BMI) 40.0 and over, adult: Secondary | ICD-10-CM

## 2023-10-24 DIAGNOSIS — Z791 Long term (current) use of non-steroidal anti-inflammatories (NSAID): Secondary | ICD-10-CM

## 2023-10-24 DIAGNOSIS — M48062 Spinal stenosis, lumbar region with neurogenic claudication: Secondary | ICD-10-CM

## 2023-10-24 DIAGNOSIS — M4726 Other spondylosis with radiculopathy, lumbar region: Secondary | ICD-10-CM | POA: Diagnosis present

## 2023-10-24 DIAGNOSIS — M5116 Intervertebral disc disorders with radiculopathy, lumbar region: Secondary | ICD-10-CM | POA: Diagnosis present

## 2023-10-24 DIAGNOSIS — Z7982 Long term (current) use of aspirin: Secondary | ICD-10-CM

## 2023-10-24 DIAGNOSIS — G8918 Other acute postprocedural pain: Secondary | ICD-10-CM | POA: Diagnosis not present

## 2023-10-24 DIAGNOSIS — M797 Fibromyalgia: Secondary | ICD-10-CM | POA: Diagnosis present

## 2023-10-24 DIAGNOSIS — M5416 Radiculopathy, lumbar region: Principal | ICD-10-CM | POA: Diagnosis present

## 2023-10-24 DIAGNOSIS — Z91041 Radiographic dye allergy status: Secondary | ICD-10-CM

## 2023-10-24 HISTORY — PX: LUMBAR LAMINECTOMY/DECOMPRESSION MICRODISCECTOMY: SHX5026

## 2023-10-24 SURGERY — LUMBAR LAMINECTOMY/DECOMPRESSION MICRODISCECTOMY 2 LEVELS
Anesthesia: General | Site: Back | Laterality: Right

## 2023-10-24 MED ORDER — CHLORHEXIDINE GLUCONATE CLOTH 2 % EX PADS: 6.0000 | MEDICATED_PAD | Freq: Once | CUTANEOUS | Status: DC

## 2023-10-24 MED ORDER — CYCLOBENZAPRINE HCL 10 MG PO TABS
10.0000 mg | ORAL_TABLET | Freq: Three times a day (TID) | ORAL | Status: DC | PRN
  Administered 2023-10-25: 10 mg via ORAL
  Filled 2023-10-24: qty 1

## 2023-10-24 MED ORDER — SERTRALINE HCL 50 MG PO TABS
50.0000 mg | ORAL_TABLET | Freq: Every day | ORAL | Status: DC
  Administered 2023-10-25 – 2023-11-06 (×13): 50 mg via ORAL
  Filled 2023-10-24 (×13): qty 1

## 2023-10-24 MED ORDER — POLYVINYL ALCOHOL 1.4 % OP SOLN: 1.0000 [drp] | Freq: Three times a day (TID) | OPHTHALMIC | Status: DC | PRN

## 2023-10-24 MED ORDER — SODIUM CHLORIDE 0.9% FLUSH
10.0000 mL | Freq: Two times a day (BID) | INTRAVENOUS | Status: DC
Start: 2023-10-24 — End: 2023-11-07
  Administered 2023-10-27 – 2023-11-05 (×11): 10 mL via INTRAVENOUS

## 2023-10-24 MED ORDER — ACETAMINOPHEN 325 MG PO TABS
650.0000 mg | ORAL_TABLET | ORAL | Status: DC | PRN
  Administered 2023-10-25 – 2023-11-06 (×27): 650 mg via ORAL
  Filled 2023-10-24 (×29): qty 2

## 2023-10-24 MED ORDER — ROCURONIUM BROMIDE 10 MG/ML (PF) SYRINGE
PREFILLED_SYRINGE | INTRAVENOUS | Status: DC | PRN
  Administered 2023-10-24: 80 mg via INTRAVENOUS

## 2023-10-24 MED ORDER — THROMBIN 5000 UNITS EX SOLR
CUTANEOUS | Status: AC
  Filled 2023-10-24: qty 10000

## 2023-10-24 MED ORDER — HYDROMORPHONE HCL 1 MG/ML IJ SOLN: 1.0000 mg | INTRAMUSCULAR | Status: DC | PRN

## 2023-10-24 MED ORDER — ONDANSETRON HCL 4 MG/2ML IJ SOLN
INTRAMUSCULAR | Status: AC
  Filled 2023-10-24: qty 2

## 2023-10-24 MED ORDER — 0.9 % SODIUM CHLORIDE (POUR BTL) OPTIME
TOPICAL | Status: DC | PRN
  Administered 2023-10-24: 1000 mL

## 2023-10-24 MED ORDER — ONDANSETRON HCL 4 MG/2ML IJ SOLN
INTRAMUSCULAR | Status: DC | PRN
  Administered 2023-10-24: 4 mg via INTRAVENOUS

## 2023-10-24 MED ORDER — THROMBIN 5000 UNITS EX SOLR
OROMUCOSAL | Status: DC | PRN
  Administered 2023-10-24: 5 mL via TOPICAL

## 2023-10-24 MED ORDER — SODIUM CHLORIDE 0.9% FLUSH: 3.0000 mL | INTRAVENOUS | Status: DC | PRN

## 2023-10-24 MED ORDER — THROMBIN (RECOMBINANT) 5000 UNITS EX SOLR
CUTANEOUS | Status: DC | PRN
  Administered 2023-10-24: 1 via TOPICAL

## 2023-10-24 MED ORDER — LACTATED RINGERS IV SOLN: INTRAVENOUS | Status: DC | PRN

## 2023-10-24 MED ORDER — POLYETHYL GLYCOL-PROPYL GLYCOL 0.4-0.3 % OP GEL: Freq: Three times a day (TID) | OPHTHALMIC | Status: DC | PRN

## 2023-10-24 MED ORDER — PHENYLEPHRINE HCL-NACL 20-0.9 MG/250ML-% IV SOLN
INTRAVENOUS | Status: DC | PRN
  Administered 2023-10-24 (×2): 80 ug via INTRAVENOUS
  Administered 2023-10-24: 25 ug/min via INTRAVENOUS

## 2023-10-24 MED ORDER — BUPIVACAINE HCL (PF) 0.25 % IJ SOLN
INTRAMUSCULAR | Status: AC
  Filled 2023-10-24: qty 30

## 2023-10-24 MED ORDER — PREGABALIN 75 MG PO CAPS
150.0000 mg | ORAL_CAPSULE | Freq: Two times a day (BID) | ORAL | Status: DC
  Administered 2023-10-24 – 2023-11-02 (×19): 150 mg via ORAL
  Filled 2023-10-24 (×19): qty 2

## 2023-10-24 MED ORDER — KETOROLAC TROMETHAMINE 30 MG/ML IJ SOLN
INTRAMUSCULAR | Status: DC | PRN
  Administered 2023-10-24: 30 mg via INTRAVENOUS

## 2023-10-24 MED ORDER — FENTANYL CITRATE (PF) 250 MCG/5ML IJ SOLN
INTRAMUSCULAR | Status: AC
  Filled 2023-10-24: qty 5

## 2023-10-24 MED ORDER — MESALAMINE ER 0.375 G PO CP24: 1.5000 g | ORAL_CAPSULE | Freq: Every morning | ORAL | Status: DC

## 2023-10-24 MED ORDER — ASPIRIN 81 MG PO TBEC
81.0000 mg | DELAYED_RELEASE_TABLET | Freq: Every day | ORAL | Status: DC
  Administered 2023-10-25 – 2023-11-06 (×13): 81 mg via ORAL
  Filled 2023-10-24 (×13): qty 1

## 2023-10-24 MED ORDER — MENTHOL 3 MG MT LOZG
1.0000 | LOZENGE | OROMUCOSAL | Status: DC | PRN
  Filled 2023-10-24: qty 9

## 2023-10-24 MED ORDER — METOPROLOL SUCCINATE ER 50 MG PO TB24
50.0000 mg | ORAL_TABLET | Freq: Every day | ORAL | Status: DC
  Administered 2023-10-26 – 2023-11-06 (×12): 50 mg via ORAL
  Filled 2023-10-24 (×12): qty 1

## 2023-10-24 MED ORDER — SODIUM CHLORIDE 0.9% FLUSH
3.0000 mL | Freq: Two times a day (BID) | INTRAVENOUS | Status: DC
Start: 2023-10-24 — End: 2023-11-07
  Administered 2023-10-24 – 2023-11-05 (×14): 3 mL via INTRAVENOUS

## 2023-10-24 MED ORDER — FENTANYL CITRATE (PF) 100 MCG/2ML IJ SOLN
INTRAMUSCULAR | Status: AC
  Filled 2023-10-24: qty 2

## 2023-10-24 MED ORDER — ALBUMIN HUMAN 5 % IV SOLN: INTRAVENOUS | Status: DC | PRN

## 2023-10-24 MED ORDER — BUPIVACAINE HCL (PF) 0.25 % IJ SOLN
INTRAMUSCULAR | Status: DC | PRN
  Administered 2023-10-24: 20 mL

## 2023-10-24 MED ORDER — LIDOCAINE 2% (20 MG/ML) 5 ML SYRINGE
INTRAMUSCULAR | Status: DC | PRN
  Administered 2023-10-24: 100 mg via INTRAVENOUS

## 2023-10-24 MED ORDER — HYDROCODONE-ACETAMINOPHEN 5-325 MG PO TABS
1.0000 | ORAL_TABLET | ORAL | Status: DC | PRN
Start: 2023-10-24 — End: 2023-11-03
  Administered 2023-10-24 – 2023-10-26 (×4): 1 via ORAL
  Filled 2023-10-24 (×4): qty 1

## 2023-10-24 MED ORDER — CEFAZOLIN SODIUM-DEXTROSE 1-4 GM/50ML-% IV SOLN
1.0000 g | Freq: Three times a day (TID) | INTRAVENOUS | Status: AC
  Administered 2023-10-24 (×2): 1 g via INTRAVENOUS
  Filled 2023-10-24 (×2): qty 50

## 2023-10-24 MED ORDER — LACTATED RINGERS IV SOLN: INTRAVENOUS | Status: DC

## 2023-10-24 MED ORDER — PROPOFOL 10 MG/ML IV BOLUS
INTRAVENOUS | Status: AC
  Filled 2023-10-24: qty 20

## 2023-10-24 MED ORDER — KETOROLAC TROMETHAMINE 30 MG/ML IJ SOLN
INTRAMUSCULAR | Status: AC
  Filled 2023-10-24: qty 1

## 2023-10-24 MED ORDER — ONDANSETRON HCL 4 MG PO TABS: 4.0000 mg | ORAL_TABLET | Freq: Four times a day (QID) | ORAL | Status: DC | PRN

## 2023-10-24 MED ORDER — EPHEDRINE SULFATE-NACL 50-0.9 MG/10ML-% IV SOSY
PREFILLED_SYRINGE | INTRAVENOUS | Status: DC | PRN
  Administered 2023-10-24: 15 mg via INTRAVENOUS
  Administered 2023-10-24: 10 mg via INTRAVENOUS
  Administered 2023-10-24: 5 mg via INTRAVENOUS

## 2023-10-24 MED ORDER — PHENYLEPHRINE 80 MCG/ML (10ML) SYRINGE FOR IV PUSH (FOR BLOOD PRESSURE SUPPORT)
PREFILLED_SYRINGE | INTRAVENOUS | Status: AC
  Filled 2023-10-24: qty 10

## 2023-10-24 MED ORDER — PHENOL 1.4 % MT LIQD: 1.0000 | OROMUCOSAL | Status: DC | PRN

## 2023-10-24 MED ORDER — HYDROCODONE-ACETAMINOPHEN 10-325 MG PO TABS
2.0000 | ORAL_TABLET | ORAL | Status: DC | PRN
  Administered 2023-10-25: 2 via ORAL
  Filled 2023-10-24: qty 2

## 2023-10-24 MED ORDER — LIDOCAINE 2% (20 MG/ML) 5 ML SYRINGE
INTRAMUSCULAR | Status: AC
  Filled 2023-10-24: qty 5

## 2023-10-24 MED ORDER — ADULT MULTIVITAMIN W/MINERALS CH
1.0000 | ORAL_TABLET | Freq: Every day | ORAL | Status: DC
  Administered 2023-10-25 – 2023-11-06 (×13): 1 via ORAL
  Filled 2023-10-24 (×13): qty 1

## 2023-10-24 MED ORDER — THROMBIN 5000 UNITS EX SOLR
CUTANEOUS | Status: AC
  Filled 2023-10-24: qty 5000

## 2023-10-24 MED ORDER — KETOROLAC TROMETHAMINE 15 MG/ML IJ SOLN
30.0000 mg | Freq: Four times a day (QID) | INTRAMUSCULAR | Status: AC
  Administered 2023-10-24 – 2023-10-25 (×4): 30 mg via INTRAVENOUS
  Filled 2023-10-24 (×4): qty 2

## 2023-10-24 MED ORDER — PROPOFOL 10 MG/ML IV BOLUS
INTRAVENOUS | Status: DC | PRN
  Administered 2023-10-24: 100 mg via INTRAVENOUS

## 2023-10-24 MED ORDER — EPHEDRINE 5 MG/ML INJ
INTRAVENOUS | Status: AC
  Filled 2023-10-24: qty 5

## 2023-10-24 MED ORDER — FENTANYL CITRATE (PF) 100 MCG/2ML IJ SOLN
25.0000 ug | INTRAMUSCULAR | Status: DC | PRN
  Administered 2023-10-24 (×2): 25 ug via INTRAVENOUS

## 2023-10-24 MED ORDER — ROSUVASTATIN CALCIUM 5 MG PO TABS
5.0000 mg | ORAL_TABLET | Freq: Every day | ORAL | Status: DC
  Administered 2023-10-24 – 2023-11-05 (×13): 5 mg via ORAL
  Filled 2023-10-24 (×14): qty 1

## 2023-10-24 MED ORDER — FENTANYL CITRATE (PF) 250 MCG/5ML IJ SOLN
INTRAMUSCULAR | Status: DC | PRN
  Administered 2023-10-24: 50 ug via INTRAVENOUS
  Administered 2023-10-24: 100 ug via INTRAVENOUS

## 2023-10-24 MED ORDER — ORAL CARE MOUTH RINSE
15.0000 mL | Freq: Once | OROMUCOSAL | Status: AC
  Administered 2023-10-24: 15 mL via OROMUCOSAL

## 2023-10-24 MED ORDER — ONDANSETRON HCL 4 MG/2ML IJ SOLN: 4.0000 mg | Freq: Four times a day (QID) | INTRAMUSCULAR | Status: DC | PRN

## 2023-10-24 MED ORDER — CHLORHEXIDINE GLUCONATE 0.12 % MT SOLN
OROMUCOSAL | Status: AC
  Filled 2023-10-24: qty 15

## 2023-10-24 MED ORDER — SUGAMMADEX SODIUM 200 MG/2ML IV SOLN
INTRAVENOUS | Status: DC | PRN
  Administered 2023-10-24: 390 mg via INTRAVENOUS

## 2023-10-24 MED ORDER — MESALAMINE ER 250 MG PO CPCR
1500.0000 mg | ORAL_CAPSULE | Freq: Every day | ORAL | Status: DC
  Administered 2023-10-25 – 2023-11-06 (×13): 1500 mg via ORAL
  Filled 2023-10-24 (×14): qty 6

## 2023-10-24 MED ORDER — VASOPRESSIN 20 UNIT/ML IV SOLN
INTRAVENOUS | Status: AC
  Filled 2023-10-24: qty 1

## 2023-10-24 MED ORDER — ACETAMINOPHEN 650 MG RE SUPP: 650.0000 mg | RECTAL | Status: DC | PRN

## 2023-10-24 MED ORDER — ROCURONIUM BROMIDE 10 MG/ML (PF) SYRINGE
PREFILLED_SYRINGE | INTRAVENOUS | Status: AC
  Filled 2023-10-24: qty 10

## 2023-10-24 MED ORDER — CEFAZOLIN SODIUM-DEXTROSE 2-4 GM/100ML-% IV SOLN
2.0000 g | INTRAVENOUS | Status: AC
  Administered 2023-10-24: 2 g via INTRAVENOUS
  Filled 2023-10-24: qty 100

## 2023-10-24 SURGICAL SUPPLY — 48 items
ADH SKN CLS APL DERMABOND .7 (GAUZE/BANDAGES/DRESSINGS) ×1
APL SKNCLS STERI-STRIP NONHPOA (GAUZE/BANDAGES/DRESSINGS) ×1
BAG COUNTER SPONGE SURGICOUNT (BAG) ×1 IMPLANT
BAG DECANTER FOR FLEXI CONT (MISCELLANEOUS) ×1 IMPLANT
BAG SPNG CNTER NS LX DISP (BAG) ×1
BENZOIN TINCTURE PRP APPL 2/3 (GAUZE/BANDAGES/DRESSINGS) ×1 IMPLANT
BLADE CLIPPER SURG (BLADE) IMPLANT
BUR CUTTER 7.0 ROUND (BURR) ×1 IMPLANT
CANISTER SUCT 3000ML PPV (MISCELLANEOUS) ×1 IMPLANT
DERMABOND ADVANCED .7 DNX12 (GAUZE/BANDAGES/DRESSINGS) ×1 IMPLANT
DRAPE HALF SHEET 40X57 (DRAPES) IMPLANT
DRAPE LAPAROTOMY 100X72X124 (DRAPES) ×1 IMPLANT
DRAPE MICROSCOPE SLANT 54X150 (MISCELLANEOUS) ×1 IMPLANT
DRAPE SURG 17X23 STRL (DRAPES) ×2 IMPLANT
DRSG OPSITE POSTOP 4X6 (GAUZE/BANDAGES/DRESSINGS) IMPLANT
DURAPREP 26ML APPLICATOR (WOUND CARE) ×1 IMPLANT
ELECT REM PT RETURN 9FT ADLT (ELECTROSURGICAL) ×1
ELECTRODE REM PT RTRN 9FT ADLT (ELECTROSURGICAL) ×1 IMPLANT
GAUZE 4X4 16PLY ~~LOC~~+RFID DBL (SPONGE) IMPLANT
GAUZE SPONGE 4X4 12PLY STRL (GAUZE/BANDAGES/DRESSINGS) ×1 IMPLANT
GLOVE BIO SURGEON STRL SZ 6.5 (GLOVE) ×1 IMPLANT
GLOVE BIOGEL PI IND STRL 6.5 (GLOVE) ×1 IMPLANT
GLOVE ECLIPSE 9.0 STRL (GLOVE) ×1 IMPLANT
GLOVE EXAM NITRILE XL STR (GLOVE) IMPLANT
GOWN STRL REUS W/ TWL LRG LVL3 (GOWN DISPOSABLE) IMPLANT
GOWN STRL REUS W/ TWL XL LVL3 (GOWN DISPOSABLE) ×1 IMPLANT
GOWN STRL REUS W/TWL 2XL LVL3 (GOWN DISPOSABLE) IMPLANT
GOWN STRL REUS W/TWL LRG LVL3 (GOWN DISPOSABLE)
GOWN STRL REUS W/TWL XL LVL3 (GOWN DISPOSABLE) ×1
HEMOSTAT POWDER KIT SURGIFOAM (HEMOSTASIS) IMPLANT
KIT BASIN OR (CUSTOM PROCEDURE TRAY) ×1 IMPLANT
KIT TURNOVER KIT B (KITS) ×1 IMPLANT
NDL HYPO 22X1.5 SAFETY MO (MISCELLANEOUS) ×1 IMPLANT
NDL SPNL 22GX3.5 QUINCKE BK (NEEDLE) IMPLANT
NEEDLE HYPO 22X1.5 SAFETY MO (MISCELLANEOUS) ×1 IMPLANT
NEEDLE SPNL 22GX3.5 QUINCKE BK (NEEDLE) IMPLANT
NS IRRIG 1000ML POUR BTL (IV SOLUTION) ×1 IMPLANT
PACK LAMINECTOMY NEURO (CUSTOM PROCEDURE TRAY) ×1 IMPLANT
PAD ARMBOARD 7.5X6 YLW CONV (MISCELLANEOUS) ×3 IMPLANT
PATTIES SURGICAL 1X1 (DISPOSABLE) IMPLANT
SPIKE FLUID TRANSFER (MISCELLANEOUS) ×1 IMPLANT
SPONGE SURGIFOAM ABS GEL SZ50 (HEMOSTASIS) ×1 IMPLANT
STRIP CLOSURE SKIN 1/2X4 (GAUZE/BANDAGES/DRESSINGS) ×1 IMPLANT
SUT VIC AB 2-0 CT1 18 (SUTURE) ×1 IMPLANT
SUT VIC AB 3-0 SH 8-18 (SUTURE) ×1 IMPLANT
TOWEL GREEN STERILE (TOWEL DISPOSABLE) ×1 IMPLANT
TOWEL GREEN STERILE FF (TOWEL DISPOSABLE) ×1 IMPLANT
WATER STERILE IRR 1000ML POUR (IV SOLUTION) ×1 IMPLANT

## 2023-10-24 NOTE — Transfer of Care (Signed)
Immediate Anesthesia Transfer of Care Note  Patient: Emily Mcbride  Procedure(s) Performed: Laminectomy and Foraminotomy - Right - Lumbar 3-Lumbar Four/Lumbar Four-Lumbar Five (Right: Back)  Patient Location: PACU  Anesthesia Type:General  Level of Consciousness: awake, alert , and oriented  Airway & Oxygen Therapy: Patient Spontanous Breathing and Patient connected to nasal cannula oxygen  Post-op Assessment: Report given to RN, Post -op Vital signs reviewed and stable, Patient moving all extremities X 4, and Patient able to stick tongue midline  Post vital signs: Reviewed and stable  Last Vitals:  Vitals Value Taken Time  BP 176/71 10/24/23 1005  Temp 98.6   Pulse 76 10/24/23 1007  Resp 17 10/24/23 1007  SpO2 98 % 10/24/23 1007  Vitals shown include unfiled device data.  Last Pain:  Vitals:   10/24/23 0659  TempSrc:   PainSc: 0-No pain         Complications: No notable events documented.

## 2023-10-24 NOTE — H&P (Signed)
Emily Mcbride is an 82 y.o. female.   Chief Complaint: Right leg pain HPI: 82 year old female remotely status post L5-S1 decompression and fusion presents with severe right lower extremity pain.  Symptoms worsened by sitting upright or attempting to ambulate.  She gets some relief with lying down.  Patient with ongoing numbness and some weakness into her right lower extremity with a history of falling.  Minimal if any left-sided symptoms.  Patient states that her back pain really is not bad it is just her right lower extremity pain.  Workup demonstrates evidence of prior decompression and fusion at L5-S1 with incomplete union at L5-S1 but no gross instability.  There is some loosening of her S1 screws which is chronic.  The patient has marked multilevel lumbar disc degeneration extending up into her thoracic spine with vacuum disc phenomenon at all levels but no gross instability.  The patient has adjacent level spondylosis and stenosis worse on the right at L3-4 and L4-5.  Patient presents now for decompressive surgery at L3-4 and L4-5 on the right in hopes improving her symptoms.  Past Medical History:  Diagnosis Date   Anemia    as a child   Anxiety    Arthritis    osteoarthritis   Chorioretinitis    Colitis    Fibromyalgia    GERD (gastroesophageal reflux disease)    no longer takes meds    Headache(784.0)    due to a fall several mos.   History of kidney stones    Hypertension    Rheumatic fever    as a child   Skipped heart beats    due to rheumatic fever as a child   Sleep apnea    does not use CPAP due discomfort    Past Surgical History:  Procedure Laterality Date   CARDIAC CATHETERIZATION     2005   COLONOSCOPY     DILATION AND CURETTAGE OF UTERUS     LUMBAR LAMINECTOMY/DECOMPRESSION MICRODISCECTOMY Right 11/16/2013   Procedure: LUMBAR LAMINECTOMY/Laminotomy/DECOMPRESSION MICRODISCECTOMY RIGHT LUMBAR FOUR-FIVE;  Surgeon: Temple Pacini, MD;  Location: [facility];   Service: Neurosurgery;  Laterality: Right;  LUMBAR LAMINECTOMY/Laminotomy/DECOMPRESSION MICRODISCECTOMY RIGHT LUMBAR FOUR-FIVE   LUMBAR LAMINECTOMY/DECOMPRESSION MICRODISCECTOMY Right 10/09/2018   Procedure: LAMINECTOMY AND FORAMINOTOMY LUMBAR FIVE- SACRAL ONE;  Surgeon: Julio Sicks, MD;  Location: [facility];  Service: Neurosurgery;  Laterality: Right;  LAMINECTOMY AND FORAMINOTOMY LUMBAR FIVE- SACRAL ONE   SPINAL CORD STIMULATOR INSERTION     TONSILLECTOMY      History reviewed. No pertinent family history. Social History:  reports that she has never smoked. She has never used smokeless tobacco. She reports that she does not drink alcohol and does not use drugs.  Allergies:  Allergies  Allergen Reactions   Red Dye #2 (Amaranth) Shortness Of Breath   Iodinated Contrast Media     Other Reaction(s): Other  Caused problems with kidneys.   Sulfa Antibiotics Hives, Rash and Other (See Comments)    Blisters     Medications Prior to Admission  Medication Sig Dispense Refill   acetaminophen (TYLENOL) 650 MG CR tablet Take 1,300 mg by mouth in the morning and at bedtime.     aspirin EC 81 MG tablet Take 81 mg by mouth in the morning.     HYDROcodone-acetaminophen (NORCO) 10-325 MG tablet Take 1 tablet by mouth every 4 (four) hours as needed for moderate pain ((score 4 to 6)). 30 tablet 0   meloxicam (MOBIC) 15 MG tablet Take 15  mg by mouth in the morning.     Menthol, Topical Analgesic, (BLUE-EMU MAXIMUM STRENGTH EX) Apply 1 application topically daily as needed (back pain).     mesalamine (APRISO) 0.375 g 24 hr capsule Take 1.5 g by mouth every morning.     metoprolol succinate (TOPROL-XL) 50 MG 24 hr tablet Take 50 mg by mouth in the morning.     Multiple Vitamin (MULTIVITAMIN WITH MINERALS) TABS tablet Take 1 tablet by mouth in the morning. Centrum Silver for Adults 50+     Polyethyl Glycol-Propyl Glycol (SYSTANE OP) Place 1 drop into both eyes 3 (three) times daily as needed (dry/irritated  eyes.).     pregabalin (LYRICA) 150 MG capsule Take 150 mg by mouth 2 (two) times daily.     rosuvastatin (CRESTOR) 5 MG tablet Take 5 mg by mouth at bedtime.     sertraline (ZOLOFT) 50 MG tablet Take 50 mg by mouth in the morning.      Results for orders placed or performed during the hospital encounter of 10/22/23 (from the past 48 hour(s))  Surgical pcr screen     Status: None   Collection Time: 10/22/23 10:48 AM   Specimen: Nasal Mucosa; Nasal Swab  Result Value Ref Range   MRSA, PCR NEGATIVE NEGATIVE   Staphylococcus aureus NEGATIVE NEGATIVE    Comment: (NOTE) The Xpert SA Assay (FDA approved for NASAL specimens in patients 51 years of age and older), is one component of a comprehensive surveillance program. It is not intended to diagnose infection nor to guide or monitor treatment. Performed at [facility], [address] [place], [place] [address]   Basic metabolic panel per protocol     Status: Abnormal   Collection Time: 10/22/23 12:02 PM  Result Value Ref Range   Sodium 140 135 - 145 mmol/L   Potassium 4.2 3.5 - 5.1 mmol/L   Chloride 106 98 - 111 mmol/L   CO2 26 22 - 32 mmol/L   Glucose, Bld 100 (H) 70 - 99 mg/dL    Comment: Glucose reference range applies only to samples taken after fasting for at least 8 hours.   BUN 22 8 - 23 mg/dL   Creatinine, Ser 1.91 (H) 0.44 - 1.00 mg/dL   Calcium 9.2 8.9 - 47.8 mg/dL   GFR, Estimated 49 (L) >60 mL/min    Comment: (NOTE) Calculated using the CKD-EPI Creatinine Equation (2021)    Anion gap 8 5 - 15    Comment: Performed at [facility], [address], [place], [place] [address]  CBC per protocol     Status: Abnormal   Collection Time: 10/22/23 12:02 PM  Result Value Ref Range   WBC 5.5 4.0 - 10.5 K/uL   RBC 3.72 (L) 3.87 - 5.11 MIL/uL   Hemoglobin 11.8 (L) 12.0 - 15.0 g/dL   HCT 13.0 86.5 - 78.4 %   MCV 99.5 80.0 - 100.0 fL   MCH 31.7 26.0 - 34.0 pg   MCHC 31.9 30.0 - 36.0 g/dL   RDW 69.6 29.5 - 28.4 %    Platelets 138 (L) 150 - 400 K/uL   nRBC 0.0 0.0 - 0.2 %    Comment: Performed at [facility], [address] [place], [place] [address]   No results found.  Pertinent items noted in HPI and remainder of comprehensive ROS otherwise negative.  Blood pressure (!) 131/56, pulse (!) 57, temperature 98.7 F (37.1 C), temperature source Oral, resp. rate 18,  height 5\' 1"  (1.549 m), weight 97.5 kg, SpO2 95%.  Patient is awake and alert.  She is oriented and appropriate.  Speech is fluent.  Judgment insight are intact.  Cranial nerve function normal bilateral.  Motor examination reveals weakness of her right quadriceps muscle group and some mild weakness of the right anterior tibialis otherwise motor strength intact.  Sensory examination with decrease sensation pinprick light touch in her right L4 and L5 dermatomes.  Straight raising is positive on the right equivocal on the left.  Reflexes are hypoactive.  Gait is antalgic.  Posture is mildly flexed.  Examination head ears eyes nose and throat is unremarked.  Chest and abdomen are obese but otherwise benign.  Extremities are free from injury deformity. 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  Plan right L3-4 and L4-5 decompressive laminotomies and foraminotomies.  Risks and benefits explained.  Patient wishes to proceed with surgery.  Emily Mcbride 10/24/2023, 7:38 AM

## 2023-10-24 NOTE — Brief Op Note (Signed)
10/24/2023  9:34 AM  PATIENT:  Briant Cedar  82 y.o. female  PRE-OPERATIVE DIAGNOSIS:  Stenosis  POST-OPERATIVE DIAGNOSIS:  Stenosis  PROCEDURE:  Procedure(s) with comments: Laminectomy and Foraminotomy - Right - Lumbar 3-Lumbar Four/Lumbar Four-Lumbar Five (Right) - 3C  SURGEON:  Surgeons and Role:    Julio Sicks, MD - Primary  PHYSICIAN ASSISTANT:   ASSISTANTSMarland Mcalpine   ANESTHESIA:   general  EBL:  400 mL   BLOOD ADMINISTERED:none  DRAINS: none   LOCAL MEDICATIONS USED:  MARCAINE     SPECIMEN:  No Specimen  DISPOSITION OF SPECIMEN:  N/A  COUNTS:  YES  TOURNIQUET:  * No tourniquets in log *  DICTATION: .Dragon Dictation  PLAN OF CARE: Admit for overnight observation  PATIENT DISPOSITION:  PACU - hemodynamically stable.   Delay start of Pharmacological VTE agent (>24hrs) due to surgical blood loss or risk of bleeding: yes

## 2023-10-24 NOTE — Anesthesia Procedure Notes (Signed)
Procedure Name: Intubation Date/Time: 10/24/2023 8:02 AM  Performed by: Cy Blamer, CRNAPre-anesthesia Checklist: Patient identified, Emergency Drugs available, Suction available and Patient being monitored Patient Re-evaluated:Patient Re-evaluated prior to induction Oxygen Delivery Method: Circle system utilized Preoxygenation: Pre-oxygenation with 100% oxygen Induction Type: IV induction Ventilation: Mask ventilation without difficulty Laryngoscope Size: Mac and 3 Grade View: Grade II Tube type: Oral Tube size: 7.0 mm Number of attempts: 2 Airway Equipment and Method: Stylet Placement Confirmation: ETT inserted through vocal cords under direct vision, positive ETCO2 and breath sounds checked- equal and bilateral Secured at: 21 cm Tube secured with: Tape Dental Injury: Teeth and Oropharynx as per pre-operative assessment  Comments: 1st attempt MAC 3 blade, Grade III view by Irving Burton, paramedic student. 2nd attempt MAC 3 blade w/BURP maneuver placed by Dr. Ace Gins

## 2023-10-24 NOTE — Op Note (Signed)
Date of procedure: 10/24/2023  Date of dictation: Same  Service: Neurosurgery  Preoperative diagnosis: Right L3-4 and right L4-5 stenosis with radiculopathy  Postoperative diagnosis: Same  Procedure Name: Right L3-4 decompressive laminotomy and foraminotomies, right L4-5 decompressive laminotomy and foraminotomies  Surgeon:Satara Virella A.Ruston Fedora, M.D.  Asst. Surgeon: Doran Durand, NP  Anesthesia: General  Indication: 82 year old female remotely status post L5-S1 decompression and fusion presents with severe right lower extremity radicular pain failing conservative management.  Workup demonstrates evidence of marked adjacent level degeneration and stenosis worse at L4-5 but also with severe stenosis on the right at L3-4.  Patient is felt to be a poor candidate for more extensive decompression and fusion surgery and we have opted to move forward with simple decompressive surgery at L3-4 and L4-5 on the right in hopes of improving her symptoms.  Operative note: After induction of anesthesia, patient positioned prone on the Wilson frame appropriate padded.  Lumbar region prepped and draped sterilely.  Incision made overlying L3-4-5.  Dissection performed in the right.  Retractor placed.  X-ray taken.  L3-4 level was confirmed.  Decompressive laminotomies then performed using high-speed drill and Kerrison rongeurs to remove the inferior aspect lamina of L3 the medial aspect of the L3-4 facet joint and the superior rim of the L4 lamina.  Ligament flavum elevated and resected.  Foraminotomies completed on the course exiting L3 and L4 nerve roots bilaterally.  Attention then placed to L4-5.  Once again decompressive laminotomy was performed using high-speed drill and Kerrison rongeurs to remove the inferior aspect lamina of L4 the medial aspect the L4-5 facet joint the superior rim of the L5 lamina.  Ligament flavum and epidural scar were elevated and resected.  Foraminotomies then complete on the course the exiting L4  and L5 nerve roots.  The disc space was examined and found to be free from obvious disc herniation or any evidence of gross instability.  The wound was then irrigated.  Gelfoam was placed topically for hemostasis.  Wounds and closed in layers with Vicryl sutures.  Steri-Strips and sterile dressing were applied.  No apparent complications.  Patient tolerated the procedure well and she returns to the recovery room postop.

## 2023-10-25 ENCOUNTER — Encounter (HOSPITAL_COMMUNITY): Payer: Self-pay | Admitting: Neurosurgery

## 2023-10-25 MED ORDER — HYDROCODONE-ACETAMINOPHEN 10-325 MG PO TABS: 2.0000 | ORAL_TABLET | ORAL | Status: DC | PRN

## 2023-10-25 MED ORDER — HYDROCODONE-ACETAMINOPHEN 10-325 MG PO TABS
1.0000 | ORAL_TABLET | ORAL | Status: DC | PRN
  Administered 2023-10-27: 1 via ORAL
  Filled 2023-10-25 (×3): qty 1

## 2023-10-25 MED ORDER — METHOCARBAMOL 500 MG PO TABS
500.0000 mg | ORAL_TABLET | Freq: Four times a day (QID) | ORAL | Status: DC | PRN
  Administered 2023-10-27 – 2023-11-05 (×11): 500 mg via ORAL
  Filled 2023-10-25 (×11): qty 1

## 2023-10-25 MED FILL — Thrombin For Soln 5000 Unit: CUTANEOUS | Qty: 2 | Status: AC

## 2023-10-25 NOTE — Anesthesia Postprocedure Evaluation (Signed)
Anesthesia Post Note  Patient: Emily Mcbride  Procedure(s) Performed: Laminectomy and Foraminotomy - Right - Lumbar 3-Lumbar Four/Lumbar Four-Lumbar Five (Right: Back)     Patient location during evaluation: PACU Anesthesia Type: General Level of consciousness: awake and alert Pain management: pain level controlled Vital Signs Assessment: post-procedure vital signs reviewed and stable Respiratory status: spontaneous breathing, nonlabored ventilation, respiratory function stable and patient connected to nasal cannula oxygen Cardiovascular status: blood pressure returned to baseline and stable Postop Assessment: no apparent nausea or vomiting Anesthetic complications: no   No notable events documented.  Last Vitals:  Vitals:   10/24/23 2310 10/25/23 0432  BP: (!) 105/58 (!) 117/54  Pulse: 70 74  Resp: 18 18  Temp: 37.1 C 37.1 C  SpO2: 93% 96%    Last Pain:  Vitals:   10/25/23 0243  TempSrc:   PainSc: Asleep                 Barth Trella S

## 2023-10-25 NOTE — Progress Notes (Signed)
OT Cancellation Note  Patient Details Name: Emily Mcbride MRN: [id number] DOB: 03/28/1941   Cancelled Treatment:    Reason Eval/Treat Not Completed: Fatigue/lethargy limiting ability to participate Pt unable to stay awake.   Attend um: Physical therapy attempted session with poor arousal and sustained attention. OT to defer evaluation to 10/26 AM.  Mateo Flow 10/25/2023, 8:16 PM

## 2023-10-25 NOTE — TOC Initial Note (Signed)
Transition of Care [facility]) - Initial/Assessment Note    Patient Details  Name: Emily Mcbride MRN: [id number] Date of Birth: 06/04/1941  [facility] of [facility] CM/SW Contact:    Kermit Balo, RN Phone Number: 10/25/2023, 3:51 PM  Clinical Narrative:                  Pt is sleepy post surgery. CM met with her spouse and provided choice with medicare.gov for home therapies. Spouse states they have used [facility] in the past and would like to use them again. Cory with [facility] accepted. Information on the AVS. Frances Furbish will contact her for the first home visit. Pt has transport home when discharged.   Expected Discharge Plan: Home w Home Health Services Barriers to Discharge: Continued Medical Work up   Patient Goals and CMS Choice   CMS Medicare.gov Compare Post Acute Care list provided to:: Patient Represenative (must comment) Choice offered to / list presented to : Spouse      Expected Discharge Plan and Services   Discharge Planning Services: CM Consult Post Acute Care Choice: Home Health Living arrangements for the past 2 months: Single Family Home                           HH Arranged: PT, OT HH Agency: [facility] Date [facility] Agency Contacted: 10/25/23   Representative spoke with at [facility] Agency: Kandee Keen  Prior Living Arrangements/Services Living arrangements for the past 2 months: Single Family Home Lives with:: Spouse Patient language and need for interpreter reviewed:: Yes Do you feel safe going back to the place where you live?: Yes        Care giver support system in place?: Yes (comment)   Criminal Activity/Legal Involvement Pertinent to Current Situation/Hospitalization: No - Comment as needed  Activities of Daily Living      Permission Sought/Granted                  Emotional Assessment Appearance:: Appears stated age         Psych Involvement: No (comment)  Admission diagnosis:  Lumbar radiculopathy [M54.16] Patient Active  Problem List   Diagnosis Date Noted   Lumbar radiculopathy 10/24/2023   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   Lumbar foraminal stenosis 10/09/2018   Spinal stenosis, lumbar region, with neurogenic claudication 11/16/2013   PCP:  Rebecka Apley, NP Pharmacy:   [facility] [address], [place] - [address] [place] [address] Phone: [phone] Fax: [phone]     Social Determinants of Health (SDOH) Social History: SDOH Screenings   Food Insecurity: No Food Insecurity (04/05/2023)   Received from [facility]  Transportation Needs: No Transportation Needs (04/05/2023)   Received from [facility]  Utilities: Not At Risk (04/05/2023)   Received from [facility]  Financial Resource Strain: Low Risk  (04/05/2023)   Received from [facility]  Physical Activity: Inactive (04/05/2023)   Received from [facility]  Social Connections: Socially Integrated (04/05/2023)   Received from [facility]  Stress: Stress Concern Present (04/05/2023)   Received from [facility]  Tobacco Use: Low Risk  (10/24/2023)   SDOH Interventions:     Readmission Risk Interventions     No data to display

## 2023-10-25 NOTE — Progress Notes (Signed)
Postop day 1.  Patient complains of back pain and some right lower extremity pain.  Has not been up and around much.  She has recently gotten pain medication and is quite somnolent and difficult to evaluate at present.  She is moving both legs well.  She does not appear to be any distress at present.  Her wound is clean and dry.  Status post lumbar decompressive surgery.  Work on mobilization today.  I will come back this evening to check on her.  Hopefully will be able to go home tonight.

## 2023-10-25 NOTE — Evaluation (Signed)
Physical Therapy Evaluation Patient Details Name: Emily Mcbride MRN: [id number] DOB: Jan 07, 1941 Today's Date: 10/25/2023  History of Present Illness  Pt is a 82 y.o. female who presents to [facility] on 10/25/2023 for  L3-L5 laminectomy and foraminotomy. PMH includes: anemia, arthritis, fibromyalgia, GERD, and HTN  Clinical Impression  Pt presents to therapy with deficits in bed mobility, sitting and standing balance.Treatment was limited due to the patient being lethargic throughout the entire session. Pt required +2 max assist for above deficits. PT suspect progress from pt once the pt become alert. PT will focus on standing balance and gait next session. Pt will continue to benefit from skilled to PT address functional deficits.      If plan is discharge home, recommend the following: A lot of help with walking and/or transfers;A lot of help with bathing/dressing/bathroom;Assistance with cooking/housework;Assist for transportation;Help with stairs or ramp for entrance   Can travel by private vehicle        Equipment Recommendations Rolling walker (2 wheels)  Recommendations for Other Services       Functional Status Assessment Patient has had a recent decline in their functional status and demonstrates the ability to make significant improvements in function in a reasonable and predictable amount of time.     Precautions / Restrictions Precautions Precautions: Back;Fall Precaution Booklet Issued: Yes (comment) Precaution Comments: Pt was unable to accept education at this time due to lethargy Required Braces or Orthoses:  (No brace needed order) Restrictions Weight Bearing Restrictions: No      Mobility  Bed Mobility Overal bed mobility: Needs Assistance Bed Mobility: Rolling, Supine to Sit, Sit to Supine Rolling: +2 for physical assistance, Max assist   Supine to sit: +2 for physical assistance, Max assist Sit to supine: +2 for physical assistance, Max assist    General bed mobility comments: Pt needed +2 assist forlog roll, hand over hand to grab bedrail due to inability to stay alert    Transfers Overall transfer level: Needs assistance Equipment used: 2 person hand held assist, Rolling walker (2 wheels)   Sit to Stand: Max assist, +2 physical assistance           General transfer comment: Intially attempted with RW, however pt did not initiate standing leading to the use of +2 max assist. Attempted a second time w/o RW and we were able to stand 10 secs at EOB, however pt was not able to initiate side stepping towards HOB.    Ambulation/Gait                  Stairs            Wheelchair Mobility     Tilt Bed    Modified Rankin (Stroke Patients Only)       Balance Overall balance assessment: Needs assistance Sitting-balance support: Bilateral upper extremity supported, Feet supported Sitting balance-Leahy Scale: Poor     Standing balance support: Bilateral upper extremity supported Standing balance-Leahy Scale: Poor Standing balance comment: +2 assist                             Pertinent Vitals/Pain Pain Assessment Pain Assessment: Faces Pain Score: Asleep Faces Pain Scale: Hurts little more Pain Location: Back Pain Descriptors / Indicators: Grimacing Pain Intervention(s): Limited activity within patient's tolerance    Home Living Family/patient expects to be discharged to:: Private residence Living Arrangements: Spouse/significant other Available Help at Discharge: Family Type of Home:  House Home Access: Stairs to enter Entrance Stairs-Rails: None Entrance Stairs-Number of Steps: 2 (Pt husband reported the steps are shorter than the average step)   Home Layout: One level Home Equipment: Agricultural consultant (2 wheels);Cane - quad;Cane - single point      Prior Function Prior Level of Function : Independent/Modified Independent                     Extremity/Trunk Assessment    Upper Extremity Assessment Upper Extremity Assessment: Difficult to assess due to impaired cognition    Lower Extremity Assessment Lower Extremity Assessment: Difficult to assess due to impaired cognition    Cervical / Trunk Assessment Cervical / Trunk Assessment: Back Surgery  Communication   Communication Communication: Difficulty communicating thoughts/reduced clarity of speech (due to lethargy) Cueing Techniques: Verbal cues;Tactile cues  Cognition Arousal: Lethargic Behavior During Therapy: Restless Overall Cognitive Status: Difficult to assess                                          General Comments General comments (skin integrity, edema, etc.): VSS    Exercises     Assessment/Plan    PT Assessment Patient needs continued PT services  PT Problem List Decreased strength;Decreased activity tolerance;Decreased range of motion;Decreased balance;Decreased mobility;Pain       PT Treatment Interventions Gait training;Stair training;Functional mobility training;Balance training    PT Goals (Current goals can be found in the Care Plan section)  Acute Rehab PT Goals Patient Stated Goal: return home PT Goal Formulation: With patient/family Time For Goal Achievement: 11/01/23 Potential to Achieve Goals: Fair    Frequency Min 1X/week     Co-evaluation               AM-PAC PT "6 Clicks" Mobility  Outcome Measure Help needed turning from your back to your side while in a flat bed without using bedrails?: A Lot Help needed moving from lying on your back to sitting on the side of a flat bed without using bedrails?: Total Help needed moving to and from a bed to a chair (including a wheelchair)?: Total Help needed standing up from a chair using your arms (e.g., wheelchair or bedside chair)?: A Lot Help needed to walk in hospital room?: Total Help needed climbing 3-5 steps with a railing? : Total 6 Click Score: 8    End of Session Equipment  Utilized During Treatment: Gait belt Activity Tolerance: Patient limited by lethargy Patient left: in bed;with call bell/phone within reach;with family/visitor present Nurse Communication: Mobility status PT Visit Diagnosis: Unsteadiness on feet (R26.81);Pain Pain - part of body:  (back)    Time: 7829-5621 PT Time Calculation (min) (ACUTE ONLY): 19 min   Charges:   PT Evaluation $PT Eval Low Complexity: 1 Low   PT General Charges $$ ACUTE PT VISIT: 1 Visit        Emily Mcbride, SPT Acute Rehabilitation Office Phone [phone]   Emily Mcbride 10/25/2023, 12:44 PM

## 2023-10-26 NOTE — Progress Notes (Signed)
0300 in/out cath 800 Bladder scan showed 450.  Pt was partially voiding on bedpan, but verbalized that lower abdomen felt full.

## 2023-10-26 NOTE — Plan of Care (Signed)
°  Problem: Clinical Measurements: Goal: Ability to maintain clinical measurements within normal limits will improve Outcome: Progressing Goal: Will remain free from infection Outcome: Progressing Goal: Diagnostic test results will improve Outcome: Progressing Goal: Respiratory complications will improve Outcome: Progressing Goal: Cardiovascular complication will be avoided Outcome: Progressing   Problem: Coping: Goal: Level of anxiety will decrease Outcome: Progressing   Problem: Elimination: Goal: Will not experience complications related to urinary retention Outcome: Progressing

## 2023-10-26 NOTE — Evaluation (Signed)
Occupational Therapy Evaluation Patient Details Name: Emily Mcbride MRN: [id number] DOB: 1941-07-10 Today's Date: 10/26/2023   History of Present Illness Pt is a 82 y.o. female who presents to [facility]  on 10/25/2023 for  L3-L5 laminectomy and foraminotomy. PMH includes: anemia, arthritis, fibromyalgia, GERD, and HTN   Clinical Impression   PTA, pt was living at home with her husband, pt was independent with ADL/IADL and functional mobility. Pt currently requires maxA+2 for bed mobility, modA+2 for sit<>stand and max multimodal cues to maintain upright posture while standing. She requires maxA for LB dressing. Pt not oriented to time, stated it was December. Pt continues to present with cognitive limitations, instability, pain, decreased activity tolerance and BLE weakness significantly impacting her safety and independence with ADL and functional mobility. Due to decline in current level of function, Patient will benefit from continued inpatient follow up therapy, <3 hours/day Will continue to follow acutely.        If plan is discharge home, recommend the following: A lot of help with walking and/or transfers;A lot of help with bathing/dressing/bathroom    Functional Status Assessment  Patient has had a recent decline in their functional status and demonstrates the ability to make significant improvements in function in a reasonable and predictable amount of time.  Equipment Recommendations   (defer to next venue)    Recommendations for Other Services       Precautions / Restrictions Precautions Precautions: Back;Fall Precaution Booklet Issued: Yes (comment) Precaution Comments: Pt was unable to accept education at this time due to lethargy Required Braces or Orthoses:  (No brace needed order) Restrictions Weight Bearing Restrictions: No      Mobility Bed Mobility Overal bed mobility: Needs Assistance Bed Mobility: Rolling, Supine to Sit, Sit to Supine Rolling: +2  for physical assistance, Max assist   Supine to sit: +2 for physical assistance, Max assist Sit to supine: +2 for physical assistance, Max assist   General bed mobility comments: +2 for log roll, frequent cues for sequencing    Transfers Overall transfer level: Needs assistance Equipment used: 2 person hand held assist, Rolling walker (2 wheels) Transfers: Sit to/from Stand Sit to Stand: Mod assist, +2 physical assistance, +2 safety/equipment           General transfer comment: sit<>stand from EOB x4 during session, pt with 2x premature sitting. She required modA+2 to powerup and boost into standing, max vc for safe hand placement and constant multimodal cues for upright posture.side stepping along EOB with max multimodal cues including visual cue from her husband      Balance Overall balance assessment: Needs assistance Sitting-balance support: Bilateral upper extremity supported, Feet supported Sitting balance-Leahy Scale: Poor     Standing balance support: Bilateral upper extremity supported Standing balance-Leahy Scale: Poor Standing balance comment: +2 assist max multimodal cues to maintain upright posture                           ADL either performed or assessed with clinical judgement   ADL Overall ADL's : Needs assistance/impaired Eating/Feeding: Set up;Sitting   Grooming: Set up;Sitting   Upper Body Bathing: Minimal assistance;Sitting   Lower Body Bathing: Moderate assistance;Sit to/from stand   Upper Body Dressing : Set up;Sitting   Lower Body Dressing: Sit to/from stand;Maximal assistance   Toilet Transfer: Moderate assistance;+2 for physical assistance;+2 for safety/equipment;Cueing for safety;Cueing for sequencing Toilet Transfer Details (indicate cue type and reason): side stepping along EOB Toileting-  Clothing Manipulation and Hygiene: Moderate assistance;+2 for physical assistance;+2 for safety/equipment;Sit to/from stand        Functional mobility during ADLs: Moderate assistance;+2 for physical assistance;+2 for safety/equipment;Rolling walker (2 wheels) General ADL Comments: pt limited by cognition, highly distractable and tangential     Vision Baseline Vision/History: 1 Wears glasses Ability to See in Adequate Light: 1 Impaired Patient Visual Report: No change from baseline       Perception         Praxis         Pertinent Vitals/Pain Pain Assessment Pain Assessment: 0-10 Pain Score: 10-Worst pain ever Pain Location: Back with standing Pain Descriptors / Indicators: Grimacing Pain Intervention(s): Limited activity within patient's tolerance, Monitored during session     Extremity/Trunk Assessment Upper Extremity Assessment Upper Extremity Assessment: Generalized weakness   Lower Extremity Assessment Lower Extremity Assessment: Defer to PT evaluation   Cervical / Trunk Assessment Cervical / Trunk Assessment: Back Surgery   Communication Communication Communication: Difficulty communicating thoughts/reduced clarity of speech (due to lethargy)   Cognition Arousal: Alert Behavior During Therapy: Restless Overall Cognitive Status: Impaired/Different from baseline Area of Impairment: Orientation, Attention, Memory, Following commands, Safety/judgement, Awareness, Problem solving                 Orientation Level: Disoriented to, Time Current Attention Level: Focused Memory: Decreased recall of precautions, Decreased short-term memory Following Commands: Follows one step commands inconsistently, Follows one step commands with increased time Safety/Judgement: Decreased awareness of safety, Decreased awareness of deficits Awareness: Intellectual Problem Solving: Slow processing, Difficulty sequencing, Requires verbal cues, Requires tactile cues General Comments: Pt requiring multiple cues to state correct date, stated month was december. Pt requiring constant vc for upright posture,  safe hand placement, and sequencing with side stepping. Pt with tangential thoughts throughout session, requiring frequent cues for redirection. Pt reports she used to do the finances, but her husband has taken over due to some noted errors.     General Comments  vss    Exercises     Shoulder Instructions      Home Living Family/patient expects to be discharged to:: Private residence Living Arrangements: Spouse/significant other Available Help at Discharge: Family Type of Home: House Home Access: Stairs to enter [employer] of Steps: 2 (Pt husband reported the steps are shorter than the average step) Entrance Stairs-Rails: None Home Layout: One level     Bathroom Shower/Tub: Producer, television/film/video: Standard Bathroom Accessibility: Yes   Home Equipment: Agricultural consultant (2 wheels);Cane - quad;Cane - single point          Prior Functioning/Environment Prior Level of Function : Independent/Modified Independent                        OT Problem List: Decreased range of motion;Decreased activity tolerance;Impaired balance (sitting and/or standing);Decreased cognition;Decreased safety awareness;Obesity;Pain;Decreased knowledge of precautions;Decreased knowledge of use of DME or AE      OT Treatment/Interventions: Self-care/ADL training;Therapeutic exercise;Energy conservation;DME and/or AE instruction;Therapeutic activities;Cognitive remediation/compensation;Patient/family education;Balance training    OT Goals(Current goals can be found in the care plan section) Acute Rehab OT Goals Patient Stated Goal: to manage pain OT Goal Formulation: With patient Time For Goal Achievement: 11/09/23 Potential to Achieve Goals: Good ADL Goals Pt Will Perform Lower Body Dressing: with min assist;sit to/from stand;with adaptive equipment Pt Will Transfer to Toilet: with min assist;ambulating Additional ADL Goal #1: Pt will demonstrate adherence to 3/3 back  precautions with minimal  cues. Additional ADL Goal #2: Pt will demonstrate anticpatory awareness for safe engagement in ADL. Additional ADL Goal #3: Pt will complete multistep cog task with <3 errors.  OT Frequency: Min 2X/week    Co-evaluation PT/OT/SLP Co-Evaluation/Treatment: Yes Reason for Co-Treatment: Complexity of the patient's impairments (multi-system involvement);For patient/therapist safety;To address functional/ADL transfers   OT goals addressed during session: ADL's and self-care      AM-PAC OT "6 Clicks" Daily Activity     Outcome Measure Help from another person eating meals?: A Little Help from another person taking care of personal grooming?: A Little Help from another person toileting, which includes using toliet, bedpan, or urinal?: A Lot Help from another person bathing (including washing, rinsing, drying)?: A Lot Help from another person to put on and taking off regular upper body clothing?: A Little Help from another person to put on and taking off regular lower body clothing?: A Lot 6 Click Score: 15   End of Session Equipment Utilized During Treatment: Gait belt;Rolling walker (2 wheels) Nurse Communication: Mobility status  Activity Tolerance: Patient tolerated treatment well Patient left: in bed;with call bell/phone within reach;with bed alarm set;Other (comment) (in sidelying with ice)  OT Visit Diagnosis: Other abnormalities of gait and mobility (R26.89);Muscle weakness (generalized) (M62.81);Other symptoms and signs involving cognitive function;Pain Pain - part of body:  (back incision site)                Time: 1478-2956 OT Time Calculation (min): 32 min Charges:  OT General Charges $OT Visit: 1 Visit OT Evaluation $OT Eval Moderate Complexity: 1 Mod  Darnetta Kesselman OTR/L Acute Rehabilitation Services Office: [phone]   Providence Crosby 10/26/2023, 3:52 PM

## 2023-10-26 NOTE — Progress Notes (Signed)
Patient had difficulty urinating throughout day. Only small voids of around 50mL. Intermittent straight cath for volume of .MD Pool made aware by night shift RN, due to same issue overnight.

## 2023-10-26 NOTE — Progress Notes (Signed)
Physical Therapy Treatment Patient Details Name: Emily Mcbride MRN: [id number] DOB: 16-Aug-1941 Today's Date: 10/26/2023   History of Present Illness Pt is a 82 y.o. female who presents to [facility] [place] on 10/25/2023 for  L3-L5 laminectomy and foraminotomy. PMH includes: anemia, arthritis, fibromyalgia, GERD, and HTN    PT Comments  Pt greeted resting in bed on arrival, more awake and alert than on evaluation however pt continues to be limited by impaired cognition max difficulty remaining and attending to task at hand with pt easily distracted by increased pain. Pt requiring max A +2 for bed mobility, and mod A +2 for transfers with pt requiring cues for sequencing throughout and max multimodal cues for upright posture in standing. Pt able to recall spinal precautions with max prompting. Will continue to follow acutely.    If plan is discharge home, recommend the following: A lot of help with walking and/or transfers;A lot of help with bathing/dressing/bathroom;Assistance with cooking/housework;Assist for transportation;Help with stairs or ramp for entrance   Can travel by private vehicle        Equipment Recommendations  Rolling walker (2 wheels)    Recommendations for Other Services       Precautions / Restrictions Precautions Precautions: Back;Fall Precaution Booklet Issued: Yes (comment) Precaution Comments: Pt was unable to accept education at this time due to lethargy Required Braces or Orthoses:  (No brace needed order) Restrictions Weight Bearing Restrictions: No     Mobility  Bed Mobility Overal bed mobility: Needs Assistance Bed Mobility: Rolling, Supine to Sit, Sit to Supine Rolling: +2 for physical assistance, Max assist   Supine to sit: +2 for physical assistance, Max assist Sit to supine: +2 for physical assistance, Max assist   General bed mobility comments: +2 for log roll, frequent cues for sequencing    Transfers Overall transfer level: Needs  assistance Equipment used: 2 person hand held assist, Rolling walker (2 wheels) Transfers: Sit to/from Stand Sit to Stand: Mod assist, +2 physical assistance, +2 safety/equipment           General transfer comment: sit<>stand from EOB x4 during session, pt with 2x premature sitting. She required modA+2 to powerup and boost into standing, max vc for safe hand placement and constant multimodal cues for upright posture.side stepping along EOB with max multimodal cues including visual cue from her husband    Ambulation/Gait                   Stairs             Wheelchair Mobility     Tilt Bed    Modified Rankin (Stroke Patients Only)       Balance Overall balance assessment: Needs assistance Sitting-balance support: Bilateral upper extremity supported, Feet supported Sitting balance-Leahy Scale: Poor     Standing balance support: Bilateral upper extremity supported Standing balance-Leahy Scale: Poor Standing balance comment: +2 assist max multimodal cues to maintain upright posture                            Cognition Arousal: Alert Behavior During Therapy: Restless Overall Cognitive Status: Impaired/Different from baseline Area of Impairment: Orientation, Attention, Memory, Following commands, Safety/judgement, Awareness, Problem solving                 Orientation Level: Disoriented to, Time Current Attention Level: Focused Memory: Decreased recall of precautions, Decreased short-term memory Following Commands: Follows one step commands inconsistently, Follows one step  commands with increased time Safety/Judgement: Decreased awareness of safety, Decreased awareness of deficits Awareness: Intellectual Problem Solving: Slow processing, Difficulty sequencing, Requires verbal cues, Requires tactile cues General Comments: Pt requiring multiple cues to state correct date, stated month was december. Pt requiring constant vc for upright  posture, safe hand placement, and sequencing with side stepping. Pt with tangential thoughts throughout session, requiring frequent cues for redirection. Pt reports she used to do the finances, but her husband has taken over due to some noted errors.        Exercises      General Comments General comments (skin integrity, edema, etc.): VSS on RA, pt spouse present at bedside and supportive      Pertinent Vitals/Pain Pain Assessment Pain Assessment: 0-10 Pain Score: 10-Worst pain ever Pain Location: Back with standing Pain Descriptors / Indicators: Grimacing Pain Intervention(s): Monitored during session, Limited activity within patient's tolerance, Repositioned, Ice applied    Home Living Family/patient expects to be discharged to:: Private residence Living Arrangements: Spouse/significant other Available Help at Discharge: Family Type of Home: House Home Access: Stairs to enter Entrance Stairs-Rails: None Entrance Stairs-Number of Steps: 2 (Pt husband reported the steps are shorter than the average step)   Home Layout: One level Home Equipment: Agricultural consultant (2 wheels);Cane - quad;Cane - single point      Prior Function            PT Goals (current goals can now be found in the care plan section) Acute Rehab PT Goals PT Goal Formulation: With patient/family Time For Goal Achievement: 11/01/23 Progress towards PT goals: Progressing toward goals    Frequency    Min 1X/week      PT Plan      Co-evaluation PT/OT/SLP Co-Evaluation/Treatment: Yes Reason for Co-Treatment: Complexity of the patient's impairments (multi-system involvement);For patient/therapist safety;To address functional/ADL transfers PT goals addressed during session: Mobility/safety with mobility;Balance;Proper use of DME OT goals addressed during session: ADL's and self-care      AM-PAC PT "6 Clicks" Mobility   Outcome Measure  Help needed turning from your back to your side while in a  flat bed without using bedrails?: A Lot Help needed moving from lying on your back to sitting on the side of a flat bed without using bedrails?: Total Help needed moving to and from a bed to a chair (including a wheelchair)?: Total Help needed standing up from a chair using your arms (e.g., wheelchair or bedside chair)?: A Lot Help needed to walk in hospital room?: Total Help needed climbing 3-5 steps with a railing? : Total 6 Click Score: 8    End of Session Equipment Utilized During Treatment: Gait belt Activity Tolerance: Patient limited by pain Patient left: in bed;with call bell/phone within reach;with family/visitor present Nurse Communication: Mobility status PT Visit Diagnosis: Unsteadiness on feet (R26.81);Pain Pain - part of body:  (back)     Time: 6213-0865 PT Time Calculation (min) (ACUTE ONLY): 33 min  Charges:    $Therapeutic Activity: 8-22 mins PT General Charges $$ ACUTE PT VISIT: 1 Visit                     Ronetta Molla R. PTA Acute Rehabilitation Services Office: [phone]   Catalina Antigua 10/26/2023, 4:07 PM

## 2023-10-26 NOTE — Progress Notes (Signed)
Having some back soreness, no leg pain, not mobilizing much yet. Mental status is more clear. Mobilize today, OT

## 2023-10-27 DIAGNOSIS — Z6841 Body Mass Index (BMI) 40.0 and over, adult: Secondary | ICD-10-CM | POA: Diagnosis not present

## 2023-10-27 DIAGNOSIS — M797 Fibromyalgia: Secondary | ICD-10-CM | POA: Diagnosis present

## 2023-10-27 DIAGNOSIS — Z91041 Radiographic dye allergy status: Secondary | ICD-10-CM | POA: Diagnosis not present

## 2023-10-27 DIAGNOSIS — I10 Essential (primary) hypertension: Secondary | ICD-10-CM | POA: Diagnosis present

## 2023-10-27 DIAGNOSIS — Z791 Long term (current) use of non-steroidal anti-inflammatories (NSAID): Secondary | ICD-10-CM | POA: Diagnosis not present

## 2023-10-27 DIAGNOSIS — Z7409 Other reduced mobility: Secondary | ICD-10-CM | POA: Diagnosis present

## 2023-10-27 DIAGNOSIS — Z7982 Long term (current) use of aspirin: Secondary | ICD-10-CM | POA: Diagnosis not present

## 2023-10-27 DIAGNOSIS — M4726 Other spondylosis with radiculopathy, lumbar region: Secondary | ICD-10-CM | POA: Diagnosis present

## 2023-10-27 DIAGNOSIS — M5416 Radiculopathy, lumbar region: Secondary | ICD-10-CM | POA: Diagnosis present

## 2023-10-27 DIAGNOSIS — Z79899 Other long term (current) drug therapy: Secondary | ICD-10-CM | POA: Diagnosis not present

## 2023-10-27 DIAGNOSIS — Z9102 Food additives allergy status: Secondary | ICD-10-CM | POA: Diagnosis not present

## 2023-10-27 DIAGNOSIS — Z882 Allergy status to sulfonamides status: Secondary | ICD-10-CM | POA: Diagnosis not present

## 2023-10-27 DIAGNOSIS — M48061 Spinal stenosis, lumbar region without neurogenic claudication: Secondary | ICD-10-CM | POA: Diagnosis present

## 2023-10-27 DIAGNOSIS — G8918 Other acute postprocedural pain: Secondary | ICD-10-CM | POA: Diagnosis not present

## 2023-10-27 DIAGNOSIS — M5116 Intervertebral disc disorders with radiculopathy, lumbar region: Secondary | ICD-10-CM | POA: Diagnosis present

## 2023-10-27 MED ORDER — DEXAMETHASONE SODIUM PHOSPHATE 4 MG/ML IJ SOLN
4.0000 mg | Freq: Four times a day (QID) | INTRAMUSCULAR | Status: AC
  Administered 2023-10-27 – 2023-10-28 (×4): 4 mg via INTRAVENOUS
  Filled 2023-10-27 (×4): qty 1

## 2023-10-27 NOTE — Progress Notes (Signed)
Still fairly slow to mobilize.  Complaining of "hurting all over."  She does complain of some right leg pain.  She has good dorsiflexion bilaterally.  We encouraged her to try to mobilize today as best she can.  Pain control is fairly limited because of its effects on her mental status.  We will try some Decadron today and see if that helps.

## 2023-10-27 NOTE — Plan of Care (Signed)
  Problem: Education: Goal: Knowledge of General Education information will improve Description: Including pain rating scale, medication(s)/side effects and non-pharmacologic comfort measures Outcome: Progressing   Problem: Health Behavior/Discharge Planning: Goal: Ability to manage health-related needs will improve Outcome: Progressing   Problem: Clinical Measurements: Goal: Ability to maintain clinical measurements within normal limits will improve Outcome: Progressing Goal: Will remain free from infection Outcome: Progressing Goal: Diagnostic test results will improve Outcome: Progressing Goal: Respiratory complications will improve Outcome: Progressing Goal: Cardiovascular complication will be avoided Outcome: Progressing   Problem: Activity: Goal: Risk for activity intolerance will decrease Outcome: Progressing   Problem: Nutrition: Goal: Adequate nutrition will be maintained Outcome: Progressing   Problem: Coping: Goal: Level of anxiety will decrease Outcome: Progressing   Problem: Elimination: Goal: Will not experience complications related to bowel motility Outcome: Progressing Goal: Will not experience complications related to urinary retention Outcome: Progressing   Problem: Pain Management: Goal: General experience of comfort will improve Outcome: Progressing   Problem: Safety: Goal: Ability to remain free from injury will improve Outcome: Progressing   Problem: Skin Integrity: Goal: Risk for impaired skin integrity will decrease Outcome: Progressing   Problem: Education: Goal: Ability to verbalize activity precautions or restrictions will improve Outcome: Progressing Goal: Knowledge of the prescribed therapeutic regimen will improve Outcome: Progressing Goal: Understanding of discharge needs will improve Outcome: Progressing   Problem: Activity: Goal: Ability to avoid complications of mobility impairment will improve Outcome: Progressing Goal:  Ability to tolerate increased activity will improve Outcome: Progressing Goal: Will remain free from falls Outcome: Progressing   Problem: Bowel/Gastric: Goal: Gastrointestinal status for postoperative course will improve Outcome: Progressing   Problem: Clinical Measurements: Goal: Ability to maintain clinical measurements within normal limits will improve Outcome: Progressing Goal: Postoperative complications will be avoided or minimized Outcome: Progressing Goal: Diagnostic test results will improve Outcome: Progressing   Problem: Pain Management: Goal: Pain level will decrease Outcome: Progressing   Problem: Skin Integrity: Goal: Will show signs of wound healing Outcome: Progressing

## 2023-10-28 NOTE — Progress Notes (Signed)
Looks somewhat better today.  Up to bedside commode during evaluation.  Patient still notes back pain this seems better controlled.  Still having some pain in his posterior aspects of both lower extremities that are severe right L4 symptoms which was her main preoperative complaints seem to be improved.  Main issue continues to be mobilization.  She is afebrile.  Her vital signs are stable.  She is awake and alert.  She is oriented and appropriate today.  Motor examination grossly intact.  Wound clean and dry.  Abdomen soft.  Continue efforts at mobilization and pain control.  I am encouraged that her severe preoperative pain symptoms seem improved but we have a long way to go before getting her home.  I think the next few days will determine whether home discharge is appropriate versus discharge to nursing facility.

## 2023-10-28 NOTE — Plan of Care (Signed)
  Problem: Education: Goal: Knowledge of General Education information will improve Description Including pain rating scale, medication(s)/side effects and non-pharmacologic comfort measures Outcome: Progressing   

## 2023-10-28 NOTE — Progress Notes (Signed)
Physical Therapy Treatment Patient Details Name: Emily Mcbride MRN: [id number] DOB: April 11, 1941 Today's Date: 10/28/2023   History of Present Illness Pt is a 82 y.o. female who presents to [facility] on 10/25/2023 for  L3-L5 laminectomy and foraminotomy. PMH includes: anemia, arthritis, fibromyalgia, GERD, and HTN    PT Comments  Pt is making slow progress towards her goals today. Unfortunately, has difficulty with recalling and maintaining back precautions in particular bending, for pericare and for ambulation with RW. Pt currently requires mod-maxA for bed mobility, min-modA for transfers and min A for short distance ambulation with close chair follow. Pt does not demonstrate enough strength and endurance to safely move around her home environment without a moderate amount of assistance that her family can not provide. Patient will benefit from continued inpatient follow up therapy, <3 hours/day. PT will continue to follow acutely.    If plan is discharge home, recommend the following: A lot of help with walking and/or transfers;A lot of help with bathing/dressing/bathroom;Assistance with cooking/housework;Assist for transportation;Help with stairs or ramp for entrance   Can travel by private vehicle      No  Equipment Recommendations  Rolling walker (2 wheels)    Recommendations for Other Services       Precautions / Restrictions Precautions Precautions: Back;Fall Precaution Booklet Issued: Yes (comment) Precaution Comments: able to recall and demonstrate only 1/3 back precautions during session Required Braces or Orthoses:  (No brace needed order) Restrictions Weight Bearing Restrictions: No     Mobility  Bed Mobility Overal bed mobility: Needs Assistance Bed Mobility: Rolling, Supine to Sit, Sit to Supine Rolling: Mod assist   Supine to sit: Max assist, Mod assist     General bed mobility comments: maximal cuing and mod physical assist for rolling and pushing to  upright    Transfers Overall transfer level: Needs assistance Equipment used: Rolling walker (2 wheels), 1 person hand held assist Transfers: Sit to/from Stand Sit to Stand: Min assist, Mod assist   Step pivot transfers: Mod assist       General transfer comment: modA for initial stand and face to face pivot transfer to [facility],    Ambulation/Gait Ambulation/Gait assistance: Min assist Gait Distance (Feet): 10 Feet Assistive device: Rolling walker (2 wheels) Gait Pattern/deviations: Step-through pattern, Trunk flexed, Wide base of support, Antalgic Gait velocity: slowed Gait velocity interpretation: <1.31 ft/sec, indicative of household ambulator   General Gait Details: min A for steadying constant reminders for standing upright and not bending over RW,         Balance Overall balance assessment: Needs assistance Sitting-balance support: Bilateral upper extremity supported, Feet supported Sitting balance-Leahy Scale: Poor     Standing balance support: Bilateral upper extremity supported Standing balance-Leahy Scale: Poor Standing balance comment: requires B UE support on RW                            Cognition Arousal: Alert Behavior During Therapy: Restless, WFL for tasks assessed/performed Overall Cognitive Status: Impaired/Different from baseline Area of Impairment: Memory, Following commands, Safety/judgement, Problem solving                     Memory: Decreased recall of precautions, Decreased short-term memory Following Commands: Follows one step commands consistently, Follows multi-step commands inconsistently Safety/Judgement: Decreased awareness of safety, Decreased awareness of deficits Awareness: Intellectual Problem Solving: Requires verbal cues, Requires tactile cues, Difficulty sequencing General Comments: requires constant reminders about back  precautions, and needs redirection back to task at hand, constant reminders for upright  posture and sequencing           General Comments General comments (skin integrity, edema, etc.): VSS on RA,daughter and husband present during session, both of which report they can not provide the physical assisntance to take her home      Pertinent Vitals/Pain Pain Assessment Pain Assessment: 0-10 Pain Score: 5  Pain Location: Back with standing Pain Descriptors / Indicators: Grimacing Pain Intervention(s): Limited activity within patient's tolerance     PT Goals (current goals can now be found in the care plan section) Acute Rehab PT Goals PT Goal Formulation: With patient/family Time For Goal Achievement: 11/01/23 Potential to Achieve Goals: Fair Progress towards PT goals: Progressing toward goals    Frequency    Min 1X/week       AM-PAC PT "6 Clicks" Mobility   Outcome Measure  Help needed turning from your back to your side while in a flat bed without using bedrails?: A Lot Help needed moving from lying on your back to sitting on the side of a flat bed without using bedrails?: A Lot Help needed moving to and from a bed to a chair (including a wheelchair)?: A Lot Help needed standing up from a chair using your arms (e.g., wheelchair or bedside chair)?: A Lot Help needed to walk in hospital room?: Total Help needed climbing 3-5 steps with a railing? : Total 6 Click Score: 10    End of Session Equipment Utilized During Treatment: Gait belt Activity Tolerance: Patient limited by pain Patient left: with family/visitor present;in chair;with chair alarm set Nurse Communication: Mobility status PT Visit Diagnosis: Unsteadiness on feet (R26.81);Pain Pain - part of body:  (back)     Time: 1340-1406 PT Time Calculation (min) (ACUTE ONLY): 26 min  Charges:    $Gait Training: 8-22 mins $Therapeutic Activity: 8-22 mins PT General Charges $$ ACUTE PT VISIT: 1 Visit                     Maile Linford B. Beverely Risen PT, DPT Acute Rehabilitation Services Please use  secure chat or  Call Office [phone]    Elon Alas Fleet 10/28/2023, 5:00 PM

## 2023-10-28 NOTE — Progress Notes (Signed)
Occupational Therapy Treatment Patient Details Name: Emily Mcbride MRN: [id number] DOB: Mar 10, 1941 Today's Date: 10/28/2023   History of present illness Pt is a 82 y.o. female who presents to [facility] on 10/25/2023 for  L3-L5 laminectomy and foraminotomy. PMH includes: anemia, arthritis, fibromyalgia, GERD, and HTN   OT comments  Pt currently at min assist level for short distance mobility to and from the toilet with use of the RW.  She still exhibits  flexed posture in standing with the need for cueing to recall back precautions as she was only able to state 1/3 initially until cued.  Feel she is making steady progress with OT at this time.  Recommend continued acute care OT to progress to a more independent level.  Feel she will benefit from continued inpatient follow up therapy, <3 hours/day post acute stay to reach a safe level for discharge home with her spouse who cannot provide a lot of physical assist.          If plan is discharge home, recommend the following:  A lot of help with bathing/dressing/bathroom;A little help with walking and/or transfers;Assistance with cooking/housework;Assist for transportation;Help with stairs or ramp for entrance   Equipment Recommendations  Other (comment) (TBD next venue of care)       Precautions / Restrictions Precautions Precautions: Back;Fall Precaution Booklet Issued: Yes (comment) Precaution Comments: Pt able to state 1/3 precautions independently, needed mod questioning cueing for the other 2 Restrictions Weight Bearing Restrictions: No       Mobility Bed Mobility                    Transfers Overall transfer level: Needs assistance Equipment used: Rolling walker (2 wheels) Transfers: Sit to/from Stand, Bed to chair/wheelchair/BSC Sit to Stand: Min assist     Step pivot transfers: Min assist     General transfer comment: Min instructional cueing for hand placement with sit to stand and stand to sit  transitions.     Balance Overall balance assessment: Needs assistance Sitting-balance support: Bilateral upper extremity supported, Feet supported Sitting balance-Leahy Scale: Fair     Standing balance support: Bilateral upper extremity supported Standing balance-Leahy Scale: Poor Standing balance comment: Pt is reliant on BUE support using the RW for standing as well as therapist assist.                           ADL either performed or assessed with clinical judgement   ADL Overall ADL's : Needs assistance/impaired                         Toilet Transfer: Minimal assistance;Ambulation;Rolling walker (2 wheels)           Functional mobility during ADLs: Minimal assistance;Rolling walker (2 wheels) General ADL Comments: Pt with flexed posture in standing and increased pain in her right leg with ambulation.      Cognition Arousal: Alert Behavior During Therapy: WFL for tasks assessed/performed Overall Cognitive Status: Impaired/Different from baseline Area of Impairment: Memory, Problem solving                   Current Attention Level: Sustained Memory: Decreased recall of precautions, Decreased short-term memory Following Commands: Follows one step commands consistently, Follows multi-step commands with increased time   Awareness: Intellectual Problem Solving: Requires tactile cues, Requires verbal cues  Pertinent Vitals/ Pain       Pain Assessment Pain Assessment: 0-10 Pain Score: 5  Pain Location: back of right leg Pain Descriptors / Indicators: Discomfort, Grimacing, Pins and needles Pain Intervention(s): Limited activity within patient's tolerance, Repositioned         Frequency  Min 2X/week        Progress Toward Goals  OT Goals(current goals can now be found in the care plan section)  Progress towards OT goals: Progressing toward goals  Acute Rehab OT Goals Patient Stated Goal: Pt did not  state but was agreeable to participation in OT session OT Goal Formulation: With patient Time For Goal Achievement: 11/09/23 Potential to Achieve Goals: Good  Plan         AM-PAC OT "6 Clicks" Daily Activity     Outcome Measure   Help from another person eating meals?: None Help from another person taking care of personal grooming?: A Little Help from another person toileting, which includes using toliet, bedpan, or urinal?: A Lot Help from another person bathing (including washing, rinsing, drying)?: A Lot Help from another person to put on and taking off regular upper body clothing?: A Little Help from another person to put on and taking off regular lower body clothing?: A Lot 6 Click Score: 16    End of Session Equipment Utilized During Treatment: Gait belt;Rolling walker (2 wheels)  OT Visit Diagnosis: Other abnormalities of gait and mobility (R26.89);Muscle weakness (generalized) (M62.81);Other symptoms and signs involving cognitive function;Pain Pain - Right/Left: Right Pain - part of body: Leg   Activity Tolerance Patient tolerated treatment well   Patient Left with call bell/phone within reach;in chair;with chair alarm set;with family/visitor present   Nurse Communication          Time: 8657-8469 OT Time Calculation (min): 39 min  Charges: OT General Charges $OT Visit: 1 Visit OT Treatments $Self Care/Home Management : 38-52 mins  Perrin Maltese, OTR/L Acute Rehabilitation Services  Office [phone] 10/28/2023

## 2023-10-28 NOTE — Plan of Care (Signed)
  Problem: Education: Goal: Knowledge of General Education information will improve Description: Including pain rating scale, medication(s)/side effects and non-pharmacologic comfort measures Outcome: Progressing   Problem: Health Behavior/Discharge Planning: Goal: Ability to manage health-related needs will improve Outcome: Progressing   Problem: Clinical Measurements: Goal: Ability to maintain clinical measurements within normal limits will improve Outcome: Progressing Goal: Will remain free from infection Outcome: Progressing Goal: Diagnostic test results will improve Outcome: Progressing Goal: Respiratory complications will improve Outcome: Progressing Goal: Cardiovascular complication will be avoided Outcome: Progressing   Problem: Activity: Goal: Risk for activity intolerance will decrease Outcome: Progressing   Problem: Nutrition: Goal: Adequate nutrition will be maintained Outcome: Progressing   Problem: Coping: Goal: Level of anxiety will decrease Outcome: Progressing   Problem: Elimination: Goal: Will not experience complications related to bowel motility Outcome: Progressing Goal: Will not experience complications related to urinary retention Outcome: Progressing   Problem: Pain Management: Goal: General experience of comfort will improve Outcome: Progressing   Problem: Safety: Goal: Ability to remain free from injury will improve Outcome: Progressing   Problem: Skin Integrity: Goal: Risk for impaired skin integrity will decrease Outcome: Progressing   Problem: Education: Goal: Ability to verbalize activity precautions or restrictions will improve Outcome: Progressing Goal: Knowledge of the prescribed therapeutic regimen will improve Outcome: Progressing Goal: Understanding of discharge needs will improve Outcome: Progressing   Problem: Activity: Goal: Ability to avoid complications of mobility impairment will improve Outcome: Progressing Goal:  Ability to tolerate increased activity will improve Outcome: Progressing Goal: Will remain free from falls Outcome: Progressing   Problem: Bowel/Gastric: Goal: Gastrointestinal status for postoperative course will improve Outcome: Progressing   Problem: Clinical Measurements: Goal: Ability to maintain clinical measurements within normal limits will improve Outcome: Progressing Goal: Postoperative complications will be avoided or minimized Outcome: Progressing Goal: Diagnostic test results will improve Outcome: Progressing   Problem: Pain Management: Goal: Pain level will decrease Outcome: Progressing   Problem: Skin Integrity: Goal: Will show signs of wound healing Outcome: Progressing

## 2023-10-28 NOTE — Plan of Care (Signed)

## 2023-10-29 NOTE — Progress Notes (Signed)
Overall doing some better.  Pain better controlled.  She is beginning to increase her activities a little bit but her mobility remains too poor for thoughts of discharge home.  Preoperative severe radicular pain much improved but still having some pain into the posterior aspects of both lower extremities.  She is afebrile.  Her vital signs are stable.  She is awake and alert.  She is oriented and appropriate.  Motor and sensory function are stable.  Wound clean and dry.  Progressing about as well as could be hoped following lumbar decompressive surgery.  Continue efforts at mobilization.  Patient may benefit from inpatient rehabilitation prior to discharge home.  She has a good family support system and I think she would be an ideal rehab candidate to increase her mobility prior to discharge home.

## 2023-10-29 NOTE — Plan of Care (Signed)
  Problem: Education: Goal: Knowledge of General Education information will improve Description Including pain rating scale, medication(s)/side effects and non-pharmacologic comfort measures Outcome: Progressing   

## 2023-10-29 NOTE — Progress Notes (Signed)
Inpatient Rehab Admissions Coordinator:   Per MD request pt was screened for CIR by Estill Dooms, PT, DPT.  Note s/p lumbar surgery with 5 day hospital stay, slow progress.  Hx of fibromyalgia.  Therapy currently recommending SNF.  Pt is min assist overall for mobility and ADLs and tolerated back to back therapy sessions.  I reached out to therapy team to see if they feel she could benefit from CIR. We would not be able to get insurance approval from [facility] with SNF recs.   Estill Dooms, PT, DPT Admissions Coordinator [phone] 10/29/23  11:12 AM

## 2023-10-30 DIAGNOSIS — M5416 Radiculopathy, lumbar region: Secondary | ICD-10-CM

## 2023-10-30 NOTE — PMR Pre-admission (Shared)
PMR Admission Coordinator Pre-Admission Assessment  Patient: Emily Mcbride is an 82 y.o., female MRN: [id number] DOB: 1941-07-29 Height: 5\' 1"  (154.9 cm) Weight: 102.1 kg              Insurance Information HMO:     PPO: yes     PCP:      IPA:      80/20:      OTHER:  PRIMARY: [employer]      Policy#: [id number]      Subscriber: pt CM Name: ***      Phone#: ***     Fax#: [phone] Pre-Cert#: [id number]      Employer:  Benefits:  Phone #: [phone]     Name: 10/30 Eff. Date: 12/31/22     Deduct: none      Out of Pocket Max: $4000      Life Max: none  CIR: $160 co pay per day days 1 until 10      SNF: no co pay per day days 1 until 20; $50 co pay per day days 21 until 100 Outpatient: $20 per visit     Co-Pay:  Home Health: 100%      Co-Pay:  DME: 80%     Co-Pay: 20% Providers: in network  SECONDARY: none  Financial Counselor:       Phone#:   The Data processing manager" for patients in Inpatient Rehabilitation Facilities with attached "Privacy Act Statement-Health Care Records" was provided and verbally reviewed with: Patient and Family  Emergency Contact Information Contact Information     Name Relation Home Work Mobile   Pelly,Clifton J Spouse [phone]  [phone]      Other Contacts   None on File    Current Medical History  Patient Admitting Diagnosis: Lumbar radiculopathy  History of Present Illness: DANYLLE Mcbride is a 82 y.o. female who presented to [facility] on 10/[facility]/23 for L3-L5 laminectomy and foraminotomy. PMH includes anemia, arthritis, fibromyalgia, GERD, and HTN. She is having difficulty recalling and maintaining back precautions and is currently MinA for short distance ambulation, mind-ModA for transfers.   Patient's medical record from [facility] has been reviewed by the rehabilitation admission coordinator and physician.  Past Medical History  Past Medical History:  Diagnosis Date   Anemia    as a  child   Anxiety    Arthritis    osteoarthritis   Chorioretinitis    Colitis    Fibromyalgia    GERD (gastroesophageal reflux disease)    no longer takes meds    Headache(784.0)    due to a fall several mos.   History of kidney stones    Hypertension    Rheumatic fever    as a child   Skipped heart beats    due to rheumatic fever as a child   Sleep apnea    does not use CPAP due discomfort   Has the patient had major surgery during 100 days prior to admission? Yes  Family History  family history is not on file.   Current Medications   Current Facility-Administered Medications:    acetaminophen (TYLENOL) tablet 650 mg, 650 mg, Oral, Q4H PRN, 650 mg at 10/29/23 2012 **OR** acetaminophen (TYLENOL) suppository 650 mg, 650 mg, Rectal, Q4H PRN, Julio Sicks, MD   aspirin EC tablet 81 mg, 81 mg, Oral, Daily, Pool, Henry, MD, 81 mg at 10/30/23 1044   HYDROcodone-acetaminophen (NORCO) 10-325 MG per tablet 1 tablet, 1 tablet, Oral, Q4H PRN,  Julio Sicks, MD, 1 tablet at 10/27/23 0104   HYDROcodone-acetaminophen (NORCO/VICODIN) 5-325 MG per tablet 1 tablet, 1 tablet, Oral, Q4H PRN, Julio Sicks, MD, 1 tablet at 10/26/23 1257   HYDROmorphone (DILAUDID) injection 1 mg, 1 mg, Intravenous, Q2H PRN, Julio Sicks, MD   menthol-cetylpyridinium (CEPACOL) lozenge 3 mg, 1 lozenge, Oral, PRN **OR** phenol (CHLORASEPTIC) mouth spray 1 spray, 1 spray, Mouth/Throat, PRN, Julio Sicks, MD   mesalamine (PENTASA) CR capsule 1,500 mg, 1,500 mg, Oral, Daily, Leander Rams, RPH, 1,500 mg at 10/30/23 1044   methocarbamol (ROBAXIN) tablet 500 mg, 500 mg, Oral, Q6H PRN, Julio Sicks, MD, 500 mg at 10/27/23 1849   metoprolol succinate (TOPROL-XL) 24 hr tablet 50 mg, 50 mg, Oral, Daily, Pool, Sherilyn Cooter, MD, 50 mg at 10/30/23 1044   multivitamin with minerals tablet 1 tablet, 1 tablet, Oral, Daily, Julio Sicks, MD, 1 tablet at 10/30/23 1044   ondansetron (ZOFRAN) tablet 4 mg, 4 mg, Oral, Q6H PRN **OR** ondansetron (ZOFRAN)  injection 4 mg, 4 mg, Intravenous, Q6H PRN, Julio Sicks, MD   polyvinyl alcohol (LIQUIFILM TEARS) 1.4 % ophthalmic solution 1 drop, 1 drop, Both Eyes, TID PRN, Imogene Burn, Lydia D, RPH   pregabalin (LYRICA) capsule 150 mg, 150 mg, Oral, BID, Pool, Sherilyn Cooter, MD, 150 mg at 10/30/23 1044   rosuvastatin (CRESTOR) tablet 5 mg, 5 mg, Oral, QHS, Pool, Sherilyn Cooter, MD, 5 mg at 10/29/23 2012   sertraline (ZOLOFT) tablet 50 mg, 50 mg, Oral, Daily, Pool, Sherilyn Cooter, MD, 50 mg at 10/30/23 1044   sodium chloride flush (NS) 0.9 % injection 10 mL, 10 mL, Intravenous, Q12H, Pool, Sherilyn Cooter, MD, 10 mL at 10/29/23 2134   sodium chloride flush (NS) 0.9 % injection 3 mL, 3 mL, Intravenous, Q12H, Pool, Sherilyn Cooter, MD, 3 mL at 10/30/23 1046   sodium chloride flush (NS) 0.9 % injection 3 mL, 3 mL, Intravenous, PRN, Julio Sicks, MD  Patients Current Diet:  Diet Order             Diet regular Room service appropriate? Yes; Fluid consistency: Thin  Diet effective now                   Precautions / Restrictions Precautions Precautions: Back, Fall Precaution Booklet Issued: Yes (comment) Precaution Comments: able to recall and demonstrate only 1/3 back precautions during session Restrictions Weight Bearing Restrictions: No   Has the patient had 2 or more falls or a fall with injury in the past year?No  Prior Activity Level Community (5-7x/wk): independent  Prior Functional Level Prior Function Prior Level of Function : Independent/Modified Independent  Self Care: Did the patient need help bathing, dressing, using the toilet or eating?  Independent  Indoor Mobility: Did the patient need assistance with walking from room to room (with or without device)? Independent  Stairs: Did the patient need assistance with internal or external stairs (with or without device)? Independent  Functional Cognition: Did the patient need help planning regular tasks such as shopping or remembering to take medications? Independent  Patient  Information Are you of Hispanic, Latino/a,or Spanish origin?: A. No, not of Hispanic, Latino/a, or Spanish origin What is your race?: A. White Do you need or want an interpreter to communicate with a doctor or health care staff?: 0. No  Patient's Response To:  Health Literacy and Transportation Is the patient able to respond to health literacy and transportation needs?: Yes Health Literacy - How often do you need to have someone help you when you read instructions, pamphlets,  or other written material from your doctor or pharmacy?: Never In the past 12 months, has lack of transportation kept you from medical appointments or from getting medications?: No In the past 12 months, has lack of transportation kept you from meetings, work, or from getting things needed for daily living?: No  Home Assistive Devices / Equipment Home Equipment: Agricultural consultant (2 wheels), Cane - quad, [place] - single point  Prior Device Use: Indicate devices/aids used by the patient prior to current illness, exacerbation or injury? None of the above  Current Functional Level Cognition  Overall Cognitive Status: Impaired/Different from baseline Difficult to assess due to: Level of arousal Current Attention Level: Sustained Orientation Level: Oriented X4 Following Commands: Follows one step commands consistently, Follows multi-step commands inconsistently Safety/Judgement: Decreased awareness of safety, Decreased awareness of deficits General Comments: requires constant reminders about back precautions, and needs redirection back to task at hand, constant reminders for upright posture and sequencing    Extremity Assessment (includes Sensation/Coordination)  Upper Extremity Assessment: Generalized weakness  Lower Extremity Assessment: Defer to PT evaluation    ADLs  Overall ADL's : Needs assistance/impaired Eating/Feeding: Set up, Sitting Grooming: Set up, Sitting Upper Body Bathing: Minimal assistance,  Sitting Lower Body Bathing: Moderate assistance, Sit to/from stand Upper Body Dressing : Set up, Sitting Lower Body Dressing: Sit to/from stand, Maximal assistance Toilet Transfer: Minimal assistance, Ambulation, Rolling walker (2 wheels) Toilet Transfer Details (indicate cue type and reason): side stepping along EOB Toileting- Clothing Manipulation and Hygiene: Moderate assistance, +2 for physical assistance, +2 for safety/equipment, Sit to/from stand Functional mobility during ADLs: Minimal assistance, Rolling walker (2 wheels) General ADL Comments: Pt with flexed posture in standing and increased pain in her right leg with ambulation.    Mobility  Overal bed mobility: Needs Assistance Bed Mobility: Rolling, Supine to Sit, Sit to Supine Rolling: Mod assist Supine to sit: Max assist, Mod assist Sit to supine: +2 for physical assistance, Max assist General bed mobility comments: pt up on BSC on arrival    Transfers  Overall transfer level: Needs assistance Equipment used: Rolling walker (2 wheels), 1 person hand held assist Transfers: Sit to/from Stand Sit to Stand: Min assist Bed to/from chair/wheelchair/BSC transfer type:: Step pivot Step pivot transfers: Mod assist General transfer comment: min A to rise from [facility] with cues for hand placement    Ambulation / Gait / Stairs / Wheelchair Mobility  Ambulation/Gait Ambulation/Gait assistance: Contact guard assist Gait Distance (Feet): 22 Feet Assistive device: Rolling walker (2 wheels) Gait Pattern/deviations: Step-through pattern, Trunk flexed, Wide base of support, Antalgic General Gait Details: consistent reminders for standing upright and not bending over RW, distance limited by fatigue and RLE pain Gait velocity: slowed Gait velocity interpretation: <1.31 ft/sec, indicative of household ambulator    Posture / Balance Balance Overall balance assessment: Needs assistance Sitting-balance support: Bilateral upper extremity  supported, Feet supported Sitting balance-Leahy Scale: Poor Standing balance support: Bilateral upper extremity supported Standing balance-Leahy Scale: Poor Standing balance comment: requires B UE support on RW    Special needs/care consideration    Previous Home Environment  Living Arrangements: Spouse/significant other Available Help at Discharge: Available 24 hours/day Type of Home: House Home Layout: One level Home Access: Stairs to enter Entrance Stairs-Rails: None Entrance Stairs-Number of Steps: 2 Bathroom Shower/Tub: Health visitor: Pharmacist, community: Yes Home Care Services: No  Discharge Living Setting Plans for Discharge Living Setting: Patient's home, Lives with (comment) (spouse) Type of Home at Discharge: House Discharge  Home Layout: One level Discharge Home Access: Stairs to enter Entrance Stairs-Rails: None Entrance Stairs-Number of Steps: 2 Discharge Bathroom Shower/Tub: Walk-in shower Discharge Bathroom Toilet: Standard Discharge Bathroom Accessibility: Yes How Accessible: Accessible via walker Does the patient have any problems obtaining your medications?: No  Social/Family/Support Systems Patient Roles: Spouse Contact Information: spouse Anticipated Caregiver: spouse Anticipated Caregiver's Contact Information: see contacts Ability/Limitations of Caregiver: no limitations; superivsion level Caregiver Availability: 24/7 Discharge Plan Discussed with Primary Caregiver: Yes Is Caregiver In Agreement with Plan?: Yes Does Caregiver/Family have Issues with Lodging/Transportation while Pt is in Rehab?: No  Goals Patient/Family Goal for Rehab: Mod I to supervision with PT, supervision to min assist with OT Expected length of stay: ELOS 5 to 7 Pt/Family Agrees to Admission and willing to participate: Yes Program Orientation Provided & Reviewed with Pt/Caregiver Including Roles  & Responsibilities: Yes  Decrease burden of Care  through IP rehab admission: n/a  Possible need for SNF placement upon discharge:not anticipated  Patient Condition: This patient's medical and functional status has changed since the consult dated: 10/30/23 in which the Rehabilitation Physician determined and documented that the patient's condition is appropriate for intensive rehabilitative care in an inpatient rehabilitation facility. See "History of Present Illness" (above) for medical update. Functional changes are: ***. Patient's medical and functional status update has been discussed with the Rehabilitation physician and patient remains appropriate for inpatient rehabilitation. Will admit to inpatient rehab today.  Preadmission Screen Completed By:  Clois Dupes, RN MSN 10/30/2023 2:59 PM ______________________________________________________________________   Discussed status with Dr. Marland Kitchenon***at *** and received approval for admission today.  Admission Coordinator:  Clois Dupes RN MSN time***/Date***

## 2023-10-30 NOTE — Progress Notes (Signed)
Mobility Specialist: Progress Note   10/30/23 1550  Mobility  Activity Ambulated with assistance in room  Level of Assistance Contact guard assist, steadying assist  Assistive Device Front wheel walker  Distance Ambulated (ft) 30 ft  Activity Response Tolerated well  Mobility Referral Yes  $Mobility charge 1 Mobility  Mobility Specialist Start Time (ACUTE ONLY) 1454  Mobility Specialist Stop Time (ACUTE ONLY) 1511  Mobility Specialist Time Calculation (min) (ACUTE ONLY) 17 min    Pt was agreeable to mobility session - received in bed. SV for bed mobility using logroll technique, light minA for STS, CG for ambulation. Ambulated in flexed position, required a few verbal cues to correct posture. Had c/o 10/10 back pain, BLE soreness, and BUE soreness d/t overcompensation with RW. Left in chair with all needs met, call bell in reach. Family in room.   Maurene Capes Mobility Specialist Please contact via SecureChat or Rehab office at [phone]

## 2023-10-30 NOTE — Progress Notes (Signed)
    Inpatient Rehabilitation Admissions Coordinator   Met with patient and spouse at bedside for rehab assessment. We discussed goals and expectations of a possible CIR admit. They prefer CIR for rehab. Family can provide expected caregiver support that is recommended . Please refer to Dr Richmond Campbell consult. I will begin insurance Auth with [facility] Medicare for possible CIR admit pending approval. Please call me with any questions.   Ottie Glazier, RN, MSN Rehab Admissions Coordinator [phone]

## 2023-10-30 NOTE — Progress Notes (Signed)
No new issues or problems overnight.  Patient resting comfortably currently.  Pain seems to be well-controlled.  Mobility still very limited.  She is afebrile.  Vital signs are stable.  She is awake and alert.  She is oriented and appropriate.Motor and sensory function are stable.  Wound is clean and dry.  Chest and abdomen benign.  Progressing slowly following lumbar decompressive surgery.  Although her pain is certainly much improved from preoperatively her mobility level is still too poor to consider discharge home.  Currently being evaluated by inpatient rehabilitation for possible rehab admission.  If this does not come through then we will need to look at skilled nursing facility placement.

## 2023-10-30 NOTE — Consult Note (Addendum)
Physical Medicine and Rehabilitation Consult Reason for Consult: Lumbar radiculopathy Referring Physician: Julio Sicks, MD   HPI: Emily Mcbride is a 82 y.o. female who presented to [facility] on 10/25/23 for L3-L5 laminectomy and foraminotomy. PMH includes anemia, arthritis, fibromyalgia, GERD, and HTN. She is having difficulty recalling and maintaining back precautions and is currently MinA for short distance ambulation, mind-ModA for transfers. Physical Medicine & Rehabilitation was consulted to assess candidacy for CIR.    ROS +lower back pain Past Medical History:  Diagnosis Date   Anemia    as a child   Anxiety    Arthritis    osteoarthritis   Chorioretinitis    Colitis    Fibromyalgia    GERD (gastroesophageal reflux disease)    no longer takes meds    Headache(784.0)    due to a fall several mos.   History of kidney stones    Hypertension    Rheumatic fever    as a child   Skipped heart beats    due to rheumatic fever as a child   Sleep apnea    does not use CPAP due discomfort   Past Surgical History:  Procedure Laterality Date   CARDIAC CATHETERIZATION     2005   COLONOSCOPY     DILATION AND CURETTAGE OF UTERUS     LUMBAR LAMINECTOMY/DECOMPRESSION MICRODISCECTOMY Right 11/16/2013   Procedure: LUMBAR LAMINECTOMY/Laminotomy/DECOMPRESSION MICRODISCECTOMY RIGHT LUMBAR FOUR-FIVE;  Surgeon: Temple Pacini, MD;  Location: [facility];  Service: Neurosurgery;  Laterality: Right;  LUMBAR LAMINECTOMY/Laminotomy/DECOMPRESSION MICRODISCECTOMY RIGHT LUMBAR FOUR-FIVE   LUMBAR LAMINECTOMY/DECOMPRESSION MICRODISCECTOMY Right 10/09/2018   Procedure: LAMINECTOMY AND FORAMINOTOMY LUMBAR FIVE- SACRAL ONE;  Surgeon: Julio Sicks, MD;  Location: [facility];  Service: Neurosurgery;  Laterality: Right;  LAMINECTOMY AND FORAMINOTOMY LUMBAR FIVE- SACRAL ONE   LUMBAR LAMINECTOMY/DECOMPRESSION MICRODISCECTOMY Right 10/24/2023   Procedure: Laminectomy and Foraminotomy - Right -  Lumbar 3-Lumbar Four/Lumbar Four-Lumbar Five;  Surgeon: Julio Sicks, MD;  Location: [facility] OR;  Service: Neurosurgery;  Laterality: Right;  3C   SPINAL CORD STIMULATOR INSERTION     TONSILLECTOMY     History reviewed. No pertinent family history. Social History:  reports that she has never smoked. She has never used smokeless tobacco. She reports that she does not drink alcohol and does not use drugs. Allergies:  Allergies  Allergen Reactions   Red Dye #2 (Amaranth) Shortness Of Breath   Iodinated Contrast Media     Other Reaction(s): Other  Caused problems with kidneys.   Sulfa Antibiotics Hives, Rash and Other (See Comments)    Blisters    Medications Prior to Admission  Medication Sig Dispense Refill   acetaminophen (TYLENOL) 650 MG CR tablet Take 1,300 mg by mouth in the morning and at bedtime.     aspirin EC 81 MG tablet Take 81 mg by mouth in the morning.     HYDROcodone-acetaminophen (NORCO) 10-325 MG tablet Take 1 tablet by mouth every 4 (four) hours as needed for moderate pain ((score 4 to 6)). 30 tablet 0   meloxicam (MOBIC) 15 MG tablet Take 15 mg by mouth in the morning.     Menthol, Topical Analgesic, (BLUE-EMU MAXIMUM STRENGTH EX) Apply 1 application topically daily as needed (back pain).     mesalamine (APRISO) 0.375 g 24 hr capsule Take 1.5 g by mouth every morning.     metoprolol succinate (TOPROL-XL) 50 MG 24 hr tablet Take 50 mg by mouth in the morning.  Multiple Vitamin (MULTIVITAMIN WITH MINERALS) TABS tablet Take 1 tablet by mouth in the morning. Centrum Silver for Adults 50+     Polyethyl Glycol-Propyl Glycol (SYSTANE OP) Place 1 drop into both eyes 3 (three) times daily as needed (dry/irritated eyes.).     pregabalin (LYRICA) 150 MG capsule Take 150 mg by mouth 2 (two) times daily.     rosuvastatin (CRESTOR) 5 MG tablet Take 5 mg by mouth at bedtime.     sertraline (ZOLOFT) 50 MG tablet Take 50 mg by mouth in the morning.      Home: Home Living Family/patient  expects to be discharged to:: Private residence Living Arrangements: Spouse/significant other Available Help at Discharge: Family Type of Home: House Home Access: Stairs to enter [employer] of Steps: 2 (Pt husband reported the steps are shorter than the average step) Entrance Stairs-Rails: None Home Layout: One level Bathroom Shower/Tub: Health visitor: Standard Bathroom Accessibility: Yes Home Equipment: Agricultural consultant (2 wheels), [employer] - quad, Medical laboratory scientific officer - single point  Functional History: Prior Function Prior Level of Function : Independent/Modified Independent Functional Status:  Mobility: Bed Mobility Overal bed mobility: Needs Assistance Bed Mobility: Rolling, Supine to Sit, Sit to Supine Rolling: Mod assist Supine to sit: Max assist, Mod assist Sit to supine: +2 for physical assistance, Max assist General bed mobility comments: maximal cuing and mod physical assist for rolling and pushing to upright Transfers Overall transfer level: Needs assistance Equipment used: Rolling walker (2 wheels), 1 person hand held assist Transfers: Sit to/from Stand Sit to Stand: Min assist, Mod assist Bed to/from chair/wheelchair/BSC transfer type:: Step pivot Step pivot transfers: Mod assist General transfer comment: modA for initial stand and face to face pivot transfer to [facility], Ambulation/Gait Ambulation/Gait assistance: Min assist Gait Distance (Feet): 10 Feet Assistive device: Rolling walker (2 wheels) Gait Pattern/deviations: Step-through pattern, Trunk flexed, Wide base of support, Antalgic General Gait Details: min A for steadying constant reminders for standing upright and not bending over RW, Gait velocity: slowed Gait velocity interpretation: <1.31 ft/sec, indicative of household ambulator    ADL: ADL Overall ADL's : Needs assistance/impaired Eating/Feeding: Set up, Sitting Grooming: Set up, Sitting Upper Body Bathing: Minimal assistance,  Sitting Lower Body Bathing: Moderate assistance, Sit to/from stand Upper Body Dressing : Set up, Sitting Lower Body Dressing: Sit to/from stand, Maximal assistance Toilet Transfer: Minimal assistance, Ambulation, Rolling walker (2 wheels) Toilet Transfer Details (indicate cue type and reason): side stepping along EOB Toileting- Clothing Manipulation and Hygiene: Moderate assistance, +2 for physical assistance, +2 for safety/equipment, Sit to/from stand Functional mobility during ADLs: Minimal assistance, Rolling walker (2 wheels) General ADL Comments: Pt with flexed posture in standing and increased pain in her right leg with ambulation.  Cognition: Cognition Overall Cognitive Status: Impaired/Different from baseline Orientation Level: Oriented X4 Cognition Arousal: Alert Behavior During Therapy: Restless, WFL for tasks assessed/performed Overall Cognitive Status: Impaired/Different from baseline Area of Impairment: Memory, Following commands, Safety/judgement, Problem solving Orientation Level: Disoriented to, Time Current Attention Level: Sustained Memory: Decreased recall of precautions, Decreased short-term memory Following Commands: Follows one step commands consistently, Follows multi-step commands inconsistently Safety/Judgement: Decreased awareness of safety, Decreased awareness of deficits Awareness: Intellectual Problem Solving: Requires verbal cues, Requires tactile cues, Difficulty sequencing General Comments: requires constant reminders about back precautions, and needs redirection back to task at hand, constant reminders for upright posture and sequencing Difficult to assess due to: Level of arousal  Blood pressure (!) 147/81, pulse 65, temperature 98.1 F (36.7 C), temperature source  Oral, resp. rate 16, height 5\' 1"  (1.549 m), weight 102.1 kg, SpO2 98%. Physical Exam Gen: no distress, normal appearing HEENT: oral mucosa pink and moist, NCAT Cardio: Reg rate Chest:  normal effort, normal rate of breathing Abd: soft, non-distended Ext: no edema Psych: pleasant, normal affect Skin: intact Neuro: Alert and oriented x3 Musculoskeletal: 5/5 strength throughout  No results found for this or any previous visit (from the past 24 hour(s)). No results found.  Assessment/Plan: Diagnosis: Lumbar radiculopathy Does the need for close, 24 hr/day medical supervision in concert with the patient's rehab needs make it unreasonable for this patient to be served in a less intensive setting? Yes Co-Morbidities requiring supervision/potential complications:  1) morbid obesity: provide dietary education 2) fibromyalgia 3) Low back pain: continue lyrica, robaxin, Norco 4) HTN: monitor BP TID 5) Anemia: monitor Hgb weekly 6) Right foot pain Prescribing Home Zynex NexWave Stimulator Device and supplies as needed. IFC, NMES and TENS medically necessary Treatment Rx: Daily @ 30-40 minutes per treatment PRN. Zynex NexWave only, no substitutions. Treatment Goals: 1) To reduce and/or eliminate pain 2) To improve functional capacity and Activities of daily living 3) To reduce or prevent the need for oral medications 4) To improve circulation in the injured region 5) To decrease or prevent muscle spasm and muscle atrophy 6) To provide a self-management tool to the patient The patient has not sufficiently improved with conservative care. Numerous studies indexed by Medline and PubMed.gov have shown Neuromuscular, Interferential, and TENS stimulators to reduce pain, improve function, and reduce medication use in injured patients. Continued use of this evidence based, safe, drug free treatment is both reasonable and medically necessary at this time.  Due to bladder management, bowel management, safety, skin/wound care, disease management, medication administration, pain management, and patient education, does the patient require 24 hr/day rehab nursing? Yes Does the patient require  coordinated care of a physician, rehab nurse, therapy disciplines of PT, OT to address physical and functional deficits in the context of the above medical diagnosis(es)? Yes Addressing deficits in the following areas: balance, endurance, locomotion, strength, transferring, bowel/bladder control, bathing, dressing, feeding, grooming, toileting, and psychosocial support Can the patient actively participate in an intensive therapy program of at least 3 hrs of therapy per day at least 5 days per week? Yes The potential for patient to make measurable gains while on inpatient rehab is excellent Anticipated functional outcomes upon discharge from inpatient rehab are modified independent  with PT, modified independent with OT, independent with SLP. Estimated rehab length of stay to reach the above functional goals is: 10-14 days  Anticipated discharge destination: Home Overall Rehab/Functional Prognosis: excellent  POST ACUTE RECOMMENDATIONS: This patient's condition is appropriate for continued rehabilitative care in the following setting: CIR Patient has agreed to participate in recommended program. Yes Note that insurance prior authorization may be required for reimbursement for recommended care.    I have personally performed a face to face diagnostic evaluation of this patient. Additionally, I have examined the patient's medical record including any pertinent labs and radiographic images. If the physician assistant has documented in this note, I have reviewed and edited or otherwise concur with the physician assistant's documentation.  Thanks,  Horton Chin, MD 10/30/2023

## 2023-10-30 NOTE — Progress Notes (Signed)
Physical Therapy Treatment Patient Details Name: Emily Mcbride MRN: [id number] DOB: 01-09-1941 Today's Date: 10/30/2023   History of Present Illness Pt is a 82 y.o. female who presents to [facility] on 10/25/2023 for  L3-L5 laminectomy and foraminotomy. PMH includes: anemia, arthritis, fibromyalgia, GERD, and HTN    PT Comments  Pt greeted up on [facility] on arrival and agreeable to session with continued progress towards acute goals. Pt continues to need max cues to adhere and recall back precautions with pt able to recall 1/3. Pt continues to demonstrate anterior flexed posture throughout standing mobility with constat cueing needed to elevate trunk and for forward gaze with poor return. Physically pt requiring min A to rise from elevated BSC and CGA for short in room gait with RW for support.  Pt agreeable to time up in chair at end of session. Current plan remains appropriate to address deficits and maximize functional independence and decrease caregiver burden. Pt continues to benefit from skilled PT services to progress toward functional mobility goals.     If plan is discharge home, recommend the following: A lot of help with walking and/or transfers;A lot of help with bathing/dressing/bathroom;Assistance with cooking/housework;Assist for transportation;Help with stairs or ramp for entrance   Can travel by private vehicle        Equipment Recommendations  Rolling walker (2 wheels)    Recommendations for Other Services       Precautions / Restrictions Precautions Precautions: Back;Fall Precaution Booklet Issued: Yes (comment) Precaution Comments: able to recall and demonstrate only 1/3 back precautions during session Required Braces or Orthoses:  (No brace needed order) Restrictions Weight Bearing Restrictions: No     Mobility  Bed Mobility Overal bed mobility: Needs Assistance             General bed mobility comments: pt up on BSC on arrival     Transfers Overall transfer level: Needs assistance Equipment used: Rolling walker (2 wheels), 1 person hand held assist Transfers: Sit to/from Stand Sit to Stand: Min assist           General transfer comment: min A to rise from [facility] with cues for hand placement    Ambulation/Gait Ambulation/Gait assistance: Contact guard assist Gait Distance (Feet): 22 Feet Assistive device: Rolling walker (2 wheels) Gait Pattern/deviations: Step-through pattern, Trunk flexed, Wide base of support, Antalgic Gait velocity: slowed     General Gait Details: consistent reminders for standing upright and not bending over RW, distance limited by fatigue and RLE pain   Stairs             Wheelchair Mobility     Tilt Bed    Modified Rankin (Stroke Patients Only)       Balance Overall balance assessment: Needs assistance Sitting-balance support: Bilateral upper extremity supported, Feet supported Sitting balance-Leahy Scale: Poor     Standing balance support: Bilateral upper extremity supported Standing balance-Leahy Scale: Poor Standing balance comment: requires B UE support on RW                            Cognition Arousal: Alert Behavior During Therapy: Restless, WFL for tasks assessed/performed Overall Cognitive Status: Impaired/Different from baseline Area of Impairment: Memory, Following commands, Safety/judgement, Problem solving                     Memory: Decreased recall of precautions, Decreased short-term memory Following Commands: Follows one step commands consistently, Follows multi-step  commands inconsistently Safety/Judgement: Decreased awareness of safety, Decreased awareness of deficits Awareness: Intellectual Problem Solving: Requires verbal cues, Requires tactile cues, Difficulty sequencing General Comments: requires constant reminders about back precautions, and needs redirection back to task at hand, constant reminders for upright  posture and sequencing        Exercises Other Exercises Other Exercises: educated pt on AP, LAQ, seated marching to complete throughout day while up in chair with pt verbalizing understanding and able to demo back    General Comments General comments (skin integrity, edema, etc.): VSS on RA, pt spouse present and supportive, CJ with TENS unit arriving at end of session for education and application to RLE      Pertinent Vitals/Pain Pain Assessment Pain Assessment: Faces Faces Pain Scale: Hurts little more Pain Location: back and RLE Pain Descriptors / Indicators: Grimacing Pain Intervention(s): Monitored during session, Limited activity within patient's tolerance, Repositioned    Home Living                          Prior Function            PT Goals (current goals can now be found in the care plan section) Acute Rehab PT Goals Patient Stated Goal: return home PT Goal Formulation: With patient/family Time For Goal Achievement: 11/01/23 Progress towards PT goals: Progressing toward goals    Frequency    Min 1X/week      PT Plan      Co-evaluation              AM-PAC PT "6 Clicks" Mobility   Outcome Measure  Help needed turning from your back to your side while in a flat bed without using bedrails?: A Lot Help needed moving from lying on your back to sitting on the side of a flat bed without using bedrails?: A Lot Help needed moving to and from a bed to a chair (including a wheelchair)?: A Lot (for cues) Help needed standing up from a chair using your arms (e.g., wheelchair or bedside chair)?: A Lot (for cues) Help needed to walk in hospital room?: A Lot (for cues) Help needed climbing 3-5 steps with a railing? : Total 6 Click Score: 11    End of Session Equipment Utilized During Treatment: Gait belt Activity Tolerance: Patient limited by pain Patient left: in chair;with call bell/phone within reach;with family/visitor present;Other (comment)  (with CJ present to apply TENS unit) Nurse Communication: Mobility status PT Visit Diagnosis: Unsteadiness on feet (R26.81);Pain Pain - part of body:  (back)     Time: 1140-1158 PT Time Calculation (min) (ACUTE ONLY): 18 min  Charges:    $Gait Training: 8-22 mins PT General Charges $$ ACUTE PT VISIT: 1 Visit                     Eowyn Tabone R. PTA Acute Rehabilitation Services Office: [phone]   Catalina Antigua 10/30/2023, 1:08 PM

## 2023-10-31 NOTE — Progress Notes (Signed)
Inpatient Rehabilitation Admissions Coordinator   Insurance has denied CIR admit. I met with pt, spouse and dtr at bedside. They are aware. Acute team and TOC made aware.  Ottie Glazier, RN, MSN Rehab Admissions Coordinator [phone] 10/31/2023 12:36 PM

## 2023-10-31 NOTE — Care Management Important Message (Signed)
Important Message  Patient Details  Name: Emily Mcbride MRN: [id number] Date of Birth: 11/12/1941   Important Message Given:  Yes - Medicare IM     Dorena Bodo 10/31/2023, 3:10 PM

## 2023-10-31 NOTE — Progress Notes (Addendum)
   Providing Compassionate, Quality Care - Together   Subjective: Patient reports she still has pain in her hamstrings and calves, but feels her pain is better than it was yesterday. She has been up to ambulate around 4 times today per her family. They are awaiting insurance authorization prior to making a final decision on a rehab facility.  Objective: Vital signs in last 24 hours: Temp:  [97.6 F (36.4 C)-98.3 F (36.8 C)] 97.6 F (36.4 C) (10/31 1451) Pulse Rate:  [58-72] 69 (10/31 1451) Resp:  [16-18] 18 (10/31 1451) BP: (118-146)/(50-69) 118/53 (10/31 1451) SpO2:  [95 %-99 %] 97 % (10/31 1451)  Intake/Output from previous day: 10/30 0701 - 10/31 0700 In: 120 [P.O.:120] Out: -  Intake/Output this shift: Total I/O In: 8 [I.V.:8] Out: -   Alert and oriented x 4 PERRLA CN II-XII grossly intact MAE, Strength and sensation intact aside from mild weakness in lower extremities that appears to be secondary to pain Incision is covered with Honeycomb dressing and Steri Strips; Dressing is clean, dry, and intact   Lab Results: No results for input(s): "WBC", "HGB", "HCT", "PLT" in the last 72 hours. BMET No results for input(s): "NA", "K", "CL", "CO2", "GLUCOSE", "BUN", "CREATININE", "CALCIUM" in the last 72 hours.  Studies/Results: No results found.  Assessment/Plan: Patient underwent decompressive laminotomies and foraminotomies on the right at L3-4 and L4-5 on 10/24/2023 with Dr. Jordan Likes. Her post operative course has been complicated due to pain control and slow mobilization.   LOS: 4 days   -Working toward discharge to a SNF. -Continue to encourage mobilization.   Val Eagle, DNP, AGNP-C Nurse Practitioner  [facility] [address], [place], [place] [address] P: [phone]    F: [phone]  10/31/2023, 3:12 PM

## 2023-10-31 NOTE — Progress Notes (Signed)
Physical Therapy Treatment Patient Details Name: Emily Mcbride MRN: [id number] DOB: 01/28/1941 Today's Date: 10/31/2023   History of Present Illness Pt is a 82 y.o. female who presents to [facility] on 10/25/2023 for  L3-L5 laminectomy and foraminotomy. PMH includes: anemia, arthritis, fibromyalgia, GERD, and HTN    PT Comments  Pt received laying completely flat in bed, family in room report pt has been up walking in room but is having increased pain in posterior legs.  Pt is agreeable to getting up with therapy and has increased recall of back precautions. Pt continues to need increased cuing for back precaution adherence and safety during session. Requiring mod A for bed mobility, minAx2 for transfers and min A for short distance ambulation in room limited by increased LE pain. With insurance denial for AIR placement PT recommending SNF level rehab prior to return home. PT will continue to follow acutely.    If plan is discharge home, recommend the following: A lot of help with walking and/or transfers;A lot of help with bathing/dressing/bathroom;Assistance with cooking/housework;Assist for transportation;Help with stairs or ramp for entrance   Can travel by private vehicle      No   Equipment Recommendations  Rolling walker (2 wheels)       Precautions / Restrictions Precautions Precautions: Back;Fall Precaution Booklet Issued: Yes (comment) Precaution Comments: able to recall only 2/3 back precautions and exhibits improved adherence during session Required Braces or Orthoses:  (No brace needed order) Restrictions Weight Bearing Restrictions: No     Mobility  Bed Mobility Overal bed mobility: Needs Assistance Bed Mobility: Rolling, Sidelying to Sit Rolling: Mod assist Sidelying to sit: Mod assist       General bed mobility comments: improved adherence to back precautions with bed mobility    Transfers Overall transfer level: Needs assistance Equipment used:  Rolling walker (2 wheels), 1 person hand held assist Transfers: Sit to/from Stand Sit to Stand: Min assist, +2 physical assistance           General transfer comment: min A to rise from [facility] with cues for hand placement, increased pain with weightbearing    Ambulation/Gait Ambulation/Gait assistance: Min assist Gait Distance (Feet): 20 Feet Assistive device: Rolling walker (2 wheels) Gait Pattern/deviations: Step-through pattern, Trunk flexed, Wide base of support, Antalgic Gait velocity: slowed Gait velocity interpretation: <1.31 ft/sec, indicative of household ambulator   General Gait Details: continues to require cuing for upright posture but is able to achieve better upright when she does attempt       Balance Overall balance assessment: Needs assistance Sitting-balance support: Bilateral upper extremity supported, Feet supported Sitting balance-Leahy Scale: Poor     Standing balance support: Bilateral upper extremity supported Standing balance-Leahy Scale: Poor Standing balance comment: requires B UE support on RW                            Cognition Arousal: Alert Behavior During Therapy: Restless, WFL for tasks assessed/performed Overall Cognitive Status: Impaired/Different from baseline Area of Impairment: Memory, Following commands, Safety/judgement, Problem solving                     Memory: Decreased recall of precautions, Decreased short-term memory Following Commands: Follows multi-step commands consistently   Awareness: Intellectual Problem Solving: Requires verbal cues, Requires tactile cues, Difficulty sequencing General Comments: improved adherence to back precautions still needs increased cuing           General  Comments General comments (skin integrity, edema, etc.): pt family reports good results with use of TENS provided by inpatient rehab      Pertinent Vitals/Pain Pain Assessment Pain Assessment: Faces Faces Pain  Scale: Hurts whole lot Pain Location: bilateral hamstrings and posterior calf Pain Descriptors / Indicators: Grimacing, Moaning, Throbbing Pain Intervention(s): Limited activity within patient's tolerance, Monitored during session, Repositioned, Patient requesting pain meds-RN notified, Other (comment) (reached out to Inpatient Rehab about TENS treatment)     PT Goals (current goals can now be found in the care plan section) Acute Rehab PT Goals Patient Stated Goal: return home PT Goal Formulation: With patient/family Time For Goal Achievement: 11/01/23 Potential to Achieve Goals: Fair Progress towards PT goals: Progressing toward goals    Frequency    Min 1X/week       AM-PAC PT "6 Clicks" Mobility   Outcome Measure  Help needed turning from your back to your side while in a flat bed without using bedrails?: A Little Help needed moving from lying on your back to sitting on the side of a flat bed without using bedrails?: A Little Help needed moving to and from a bed to a chair (including a wheelchair)?: A Lot (for cues) Help needed standing up from a chair using your arms (e.g., wheelchair or bedside chair)?: A Lot (for cues) Help needed to walk in hospital room?: A Lot (for cues) Help needed climbing 3-5 steps with a railing? : Total 6 Click Score: 13    End of Session Equipment Utilized During Treatment: Gait belt Activity Tolerance: Patient limited by pain Patient left: in chair;with chair alarm set;with family/visitor present;with call bell/phone within reach Nurse Communication: Mobility status PT Visit Diagnosis: Unsteadiness on feet (R26.81);Pain Pain - part of body:  (back)     Time: 0623-7628 PT Time Calculation (min) (ACUTE ONLY): 24 min  Charges:    $Gait Training: 8-22 mins $Therapeutic Activity: 8-22 mins PT General Charges $$ ACUTE PT VISIT: 1 Visit                     Tya Haughey B. Beverely Risen PT, DPT Acute Rehabilitation Services Please use secure  chat or  Call Office [phone]    Elon Alas [facility] 10/31/2023, 12:58 PM

## 2023-10-31 NOTE — Plan of Care (Signed)
  Problem: Education: Goal: Knowledge of General Education information will improve Description: Including pain rating scale, medication(s)/side effects and non-pharmacologic comfort measures Outcome: Progressing   Problem: Health Behavior/Discharge Planning: Goal: Ability to manage health-related needs will improve Outcome: Progressing   Problem: Clinical Measurements: Goal: Ability to maintain clinical measurements within normal limits will improve Outcome: Progressing Goal: Will remain free from infection Outcome: Progressing Goal: Diagnostic test results will improve Outcome: Progressing Goal: Respiratory complications will improve Outcome: Progressing Goal: Cardiovascular complication will be avoided Outcome: Progressing   Problem: Activity: Goal: Risk for activity intolerance will decrease Outcome: Progressing   Problem: Nutrition: Goal: Adequate nutrition will be maintained Outcome: Progressing   Problem: Coping: Goal: Level of anxiety will decrease Outcome: Progressing   Problem: Elimination: Goal: Will not experience complications related to bowel motility Outcome: Progressing Goal: Will not experience complications related to urinary retention Outcome: Progressing   Problem: Pain Management: Goal: General experience of comfort will improve Outcome: Progressing   Problem: Safety: Goal: Ability to remain free from injury will improve Outcome: Progressing   Problem: Skin Integrity: Goal: Risk for impaired skin integrity will decrease Outcome: Progressing   Problem: Education: Goal: Ability to verbalize activity precautions or restrictions will improve Outcome: Progressing Goal: Knowledge of the prescribed therapeutic regimen will improve Outcome: Progressing Goal: Understanding of discharge needs will improve Outcome: Progressing   Problem: Activity: Goal: Ability to avoid complications of mobility impairment will improve Outcome: Progressing Goal:  Ability to tolerate increased activity will improve Outcome: Progressing Goal: Will remain free from falls Outcome: Progressing   Problem: Bowel/Gastric: Goal: Gastrointestinal status for postoperative course will improve Outcome: Progressing   Problem: Clinical Measurements: Goal: Ability to maintain clinical measurements within normal limits will improve Outcome: Progressing Goal: Postoperative complications will be avoided or minimized Outcome: Progressing Goal: Diagnostic test results will improve Outcome: Progressing

## 2023-10-31 NOTE — TOC Initial Note (Signed)
Transition of Care [facility]) - Initial/Assessment Note    Patient Details  Name: Emily Mcbride MRN: [id number] Date of Birth: 11/23/1941  Transition of [facility]) CM/SW Contact:    Lorri Frederick, LCSW Phone Number: 10/31/2023, 2:07 PM  Clinical Narrative:   CSW informed that CIR auth request has been denied.  CSW spoke with pt, husband Ephriam Knuckles, daughter Dois Davenport in room, discussed alternative plans.  They have not made up their mind yet on SNF vs HH, but are considering SNF in [facility] and agreeable for CSW to send out referral in hub, also asking that referral be sent to [facility].  Medicare choice document provided.  Permission given to speak with Ephriam Knuckles and Dois Davenport.    Referral sent out in hub.  PASSR requires additional info, message to Dr Jordan Likes to sign docs.                Expected Discharge Plan: Skilled Nursing Facility Barriers to Discharge: SNF Pending bed offer   Patient Goals and CMS Choice Patient states their goals for this hospitalization and ongoing recovery are:: walk with less pain CMS Medicare.gov Compare Post Acute Care list provided to:: Patient Choice offered to / list presented to : Patient      Expected Discharge Plan and Services   Discharge Planning Services: CM Consult Post Acute Care Choice: Skilled Nursing Facility Living arrangements for the past 2 months: Single Family Home                           HH Arranged: PT, OT HH Agency: [facility] Date [facility] Agency Contacted: 10/25/23   Representative spoke with at [facility] Agency: Kandee Keen  Prior Living Arrangements/Services Living arrangements for the past 2 months: Single Family Home Lives with:: Spouse Patient language and need for interpreter reviewed:: Yes Do you feel safe going back to the place where you live?: Yes        Care giver support system in place?: Yes (comment)   Criminal Activity/Legal Involvement Pertinent to Current Situation/Hospitalization: No - Comment  as needed  Activities of Daily Living   ADL Screening (condition at time of admission) Independently performs ADLs?: No Does the patient have a NEW difficulty with bathing/dressing/toileting/self-feeding that is expected to last >3 days?: Yes (Initiates electronic notice to provider for possible OT consult) Does the patient have a NEW difficulty with getting in/out of bed, walking, or climbing stairs that is expected to last >3 days?: Yes (Initiates electronic notice to provider for possible PT consult) Does the patient have a NEW difficulty with communication that is expected to last >3 days?: No Is the patient deaf or have difficulty hearing?: No Does the patient have difficulty seeing, even when wearing glasses/contacts?: No Does the patient have difficulty concentrating, remembering, or making decisions?: Yes  Permission Sought/Granted Permission sought to share information with : Family Supports Permission granted to share information with : Yes, Verbal Permission Granted  Share Information with NAME: husband Ephriam Knuckles, daughter Dois Davenport           Emotional Assessment Appearance:: Appears stated age Attitude/Demeanor/Rapport: Engaged Affect (typically observed): Appropriate, Pleasant Orientation: : Oriented to Self, Oriented to Place, Oriented to  Time, Oriented to Situation   Psych Involvement: No (comment)  Admission diagnosis:  Lumbar radiculopathy [M54.16] Patient Active Problem List   Diagnosis Date Noted   Lumbar radiculopathy 10/24/2023   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   Lumbar foraminal stenosis 10/09/2018   Spinal stenosis,  lumbar region, with neurogenic claudication 11/16/2013   PCP:  Rebecka Apley, NP Pharmacy:   [facility] [address] [place] - [address] [place] [address] Phone: [phone] Fax: [phone]     Social Determinants of Health (SDOH) Social History: SDOH Screenings   Food Insecurity: No  Food Insecurity (10/27/2023)  Housing: Low Risk  (10/27/2023)  Transportation Needs: No Transportation Needs (10/27/2023)  Utilities: Not At Risk (10/27/2023)  Financial Resource Strain: Low Risk  (04/05/2023)   Received from [facility]  Physical Activity: Inactive (04/05/2023)   [facility] from [facility]  Social Connections: Socially Integrated (04/05/2023)   Received from [facility]  Stress: Stress Concern Present (04/05/2023)   Received from [facility]  Tobacco Use: Low Risk  (10/24/2023)   SDOH Interventions:     Readmission Risk Interventions     No data to display

## 2023-10-31 NOTE — Progress Notes (Signed)
RE:  Emily Mcbride      Date of Birth:  06/10/1941     Date:  10/31/23        To Whom It May Concern:  Please be advised that the above-named patient will require a short-term nursing home stay - anticipated 30 days or less for rehabilitation and strengthening.  The plan is for return home.                 MD signature                Date

## 2023-10-31 NOTE — NC FL2 (Signed)
[place] MEDICAID FL2 LEVEL OF CARE FORM     IDENTIFICATION  Patient Name: Emily Mcbride Birthdate: 1941/01/26 Sex: female Admission Date (Current Location): 10/24/2023  [facility] and [place] Number:  Producer, television/film/video and Address:  [facility] [place], [address] [place], [place] [address]      Provider Number: [id number]  Attending Physician Name and Address:  Julio Sicks, MD  Relative Name and Phone Number:  Junior, Guyette Spouse [phone]    Current Level of Care: Hospital Recommended Level of Care: Skilled Nursing Facility Prior Approval Number:    Date Approved/Denied:   PASRR Number:    Discharge Plan: SNF    Current Diagnoses: Patient Active Problem List   Diagnosis Date Noted   Lumbar radiculopathy 10/24/2023   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   Lumbar foraminal stenosis 10/09/2018   Spinal stenosis, lumbar region, with neurogenic claudication 11/16/2013    Orientation RESPIRATION BLADDER Height & Weight     Self, Time, Situation, Place  Normal Continent Weight: 225 lb 1.4 oz (102.1 kg) Height:  5\' 1"  (154.9 cm)  BEHAVIORAL SYMPTOMS/MOOD NEUROLOGICAL BOWEL NUTRITION STATUS      Continent Diet (see discharge summary)  AMBULATORY STATUS COMMUNICATION OF NEEDS Skin   Limited Assist Verbally Surgical wounds, Other (Comment) (redness)                       Personal Care Assistance Level of Assistance  Bathing, Feeding, Dressing Bathing Assistance: Limited assistance Feeding assistance: Limited assistance Dressing Assistance: Maximum assistance     Functional Limitations Info  Sight, Hearing, Speech Sight Info: Adequate Hearing Info: Adequate Speech Info: Adequate    SPECIAL CARE FACTORS FREQUENCY  PT (By licensed PT), OT (By licensed OT)     PT Frequency: 5x week OT Frequency: 5x week            Contractures Contractures Info: Not present    Additional Factors Info  Code Status,  Allergies Code Status Info: full Allergies Info: Red Dye #2 (Amaranth), Iodinated Contrast Media, Sulfa Antibiotics           Current Medications (10/31/2023):  This is the current hospital active medication list Current Facility-Administered Medications  Medication Dose Route Frequency Provider Last Rate Last Admin   acetaminophen (TYLENOL) tablet 650 mg  650 mg Oral Q4H PRN Julio Sicks, MD   650 mg at 10/31/23 2952   Or   acetaminophen (TYLENOL) suppository 650 mg  650 mg Rectal Q4H PRN Julio Sicks, MD       aspirin EC tablet 81 mg  81 mg Oral Daily Julio Sicks, MD   81 mg at 10/31/23 8413   HYDROcodone-acetaminophen (NORCO) 10-325 MG per tablet 1 tablet  1 tablet Oral Q4H PRN Julio Sicks, MD   1 tablet at 10/27/23 0104   HYDROcodone-acetaminophen (NORCO/VICODIN) 5-325 MG per tablet 1 tablet  1 tablet Oral Q4H PRN Julio Sicks, MD   1 tablet at 10/26/23 1257   HYDROmorphone (DILAUDID) injection 1 mg  1 mg Intravenous Q2H PRN Julio Sicks, MD       menthol-cetylpyridinium (CEPACOL) lozenge 3 mg  1 lozenge Oral PRN Julio Sicks, MD       Or   phenol (CHLORASEPTIC) mouth spray 1 spray  1 spray Mouth/Throat PRN Julio Sicks, MD       mesalamine (PENTASA) CR capsule 1,500 mg  1,500 mg Oral Daily Leander Rams, RPH   1,500 mg at 10/31/23 [phone]  methocarbamol (ROBAXIN) tablet 500 mg  500 mg Oral Q6H PRN Julio Sicks, MD   500 mg at 10/31/23 1231   metoprolol succinate (TOPROL-XL) 24 hr tablet 50 mg  50 mg Oral Daily Julio Sicks, MD   50 mg at 10/31/23 1610   multivitamin with minerals tablet 1 tablet  1 tablet Oral Daily Julio Sicks, MD   1 tablet at 10/31/23 0822   ondansetron (ZOFRAN) tablet 4 mg  4 mg Oral Q6H PRN Julio Sicks, MD       Or   ondansetron [facility]) injection 4 mg  4 mg Intravenous Q6H PRN Julio Sicks, MD       polyvinyl alcohol (LIQUIFILM TEARS) 1.4 % ophthalmic solution 1 drop  1 drop Both Eyes TID PRN Alphia Moh D, RPH       pregabalin (LYRICA) capsule 150 mg  150 mg Oral BID  Julio Sicks, MD   150 mg at 10/31/23 9604   rosuvastatin (CRESTOR) tablet 5 mg  5 mg Oral QHS Julio Sicks, MD   5 mg at 10/30/23 2123   sertraline (ZOLOFT) tablet 50 mg  50 mg Oral Daily Julio Sicks, MD   50 mg at 10/31/23 5409   sodium chloride flush (NS) 0.9 % injection 10 mL  10 mL Intravenous Q12H Julio Sicks, MD   10 mL at 10/31/23 0841   sodium chloride flush (NS) 0.9 % injection 3 mL  3 mL Intravenous Q12H Julio Sicks, MD   3 mL at 10/31/23 0842   sodium chloride flush (NS) 0.9 % injection 3 mL  3 mL Intravenous PRN Julio Sicks, MD         Discharge Medications: Please see discharge summary for a list of discharge medications.  Relevant Imaging Results:  Relevant Lab Results:   Additional Information SSN: [id number]  Lorri Frederick, LCSW

## 2023-11-01 NOTE — Progress Notes (Signed)
Mobility Specialist: Progress Note   11/01/23 1544  Mobility  Activity Ambulated with assistance in room  Level of Assistance Minimal assist, patient does 75% or more  Assistive Device Front wheel walker  Distance Ambulated (ft) 20 ft  Activity Response Tolerated well  Mobility Referral Yes  $Mobility charge 1 Mobility  Mobility Specialist Start Time (ACUTE ONLY) 1525  Mobility Specialist Stop Time (ACUTE ONLY) 1541  Mobility Specialist Time Calculation (min) (ACUTE ONLY) 16 min    Pt was agreeable to mobility session- received in bed. ModA for bed mobility using logroll technique. MinA for STS, rocks/powers up into standing postion. Initially CG once up, but ultimately becomes minA d/t fatigue, LLE pain, and UE weakness with RW use. Motivated to walk but ambulation limited d/t intense ankle pain. Required multiple verbal and tactile cues to correct posture throughout ambulation; able to tolerate for a few step before reverting back to flexed position with UE overcompensation on RW. Left in chair with all needs met, call bell in reach.    Maurene Capes Mobility Specialist Please contact via SecureChat or Rehab office at [phone]

## 2023-11-01 NOTE — Plan of Care (Signed)

## 2023-11-01 NOTE — Progress Notes (Signed)
Occupational Therapy Treatment Patient Details Name: Emily Mcbride MRN: [id number] DOB: Nov 19, 1941 Today's Date: 11/01/2023   History of present illness Pt is a 82 y.o. female who presents to [facility] on 10/25/2023 for  L3-L5 laminectomy and foraminotomy. PMH includes: anemia, arthritis, fibromyalgia, GERD, and HTN   OT comments  Patient received lying flat in bed with patient stating to  relieve BLE pain. Patient able to recall 3/3 back precautions and required mod assist to get to EOB with cues for rail use and log rolling. Patient and husband educated on bed rail for bed at home. Patient was min assist to stand and to ambulate from EOB to recliner with patient ambulating around the bed. Patient educated on AE use for LB dressing with reacher and sock aide with patient demonstrating good understanding of use. Patient will benefit from continued inpatient follow up therapy, <3 hours/day to further address LB dressing with AE, functional transfers, and back precautions. Acute OT to continue to follow.       If plan is discharge home, recommend the following:  A lot of help with bathing/dressing/bathroom;A little help with walking and/or transfers;Assistance with cooking/housework;Assist for transportation;Help with stairs or ramp for entrance   Equipment Recommendations  Other (comment) (TBD next venue of care)    Recommendations for Other Services      Precautions / Restrictions Precautions Precautions: Back;Fall Precaution Booklet Issued: Yes (comment) Precaution Comments: able to recall 3/3 back precautions Required Braces or Orthoses:  (No brace needed order) Restrictions Weight Bearing Restrictions: No       Mobility Bed Mobility Overal bed mobility: Needs Assistance Bed Mobility: Rolling, Sidelying to Sit Rolling: Mod assist Sidelying to sit: Mod assist       General bed mobility comments: verbal cues for rail use and log rolling technique with assitance for  trunk    Transfers Overall transfer level: Needs assistance Equipment used: Rolling walker (2 wheels) Transfers: Sit to/from Stand Sit to Stand: Min assist           General transfer comment: min assist to stand from EOB and patient ambulated around bed to recliner with min assist and cues     Balance Overall balance assessment: Needs assistance Sitting-balance support: Bilateral upper extremity supported, Feet supported Sitting balance-Leahy Scale: Poor Sitting balance - Comments: EOB   Standing balance support: Bilateral upper extremity supported Standing balance-Leahy Scale: Poor Standing balance comment: requires BUES support with RW                           ADL either performed or assessed with clinical judgement   ADL Overall ADL's : Needs assistance/impaired     Grooming: Wash/dry hands;Wash/dry face;Oral care;Set up;Sitting               Lower Body Dressing: Minimal assistance;With adaptive equipment;Sitting/lateral leans Lower Body Dressing Details (indicate cue type and reason): education on AE use for LB dressing with reacher to doff socks and sock aide to donn Toilet Transfer: Minimal assistance;Ambulation;Rolling walker (2 wheels) Toilet Transfer Details (indicate cue type and reason): simulated to recliner           General ADL Comments: limited by BLE hamstring and calf pain    Extremity/Trunk Assessment              Vision       Perception     Praxis      Cognition Arousal: Alert Behavior During Therapy:  WFL for tasks assessed/performed Overall Cognitive Status: Impaired/Different from baseline Area of Impairment: Memory, Following commands, Safety/judgement, Problem solving                     Memory: Decreased short-term memory Following Commands: Follows multi-step commands consistently   Awareness: Intellectual Problem Solving: Requires verbal cues, Requires tactile cues, Difficulty sequencing General  Comments: able to recall 3/3 back precautions but required verbal cues to adhere        Exercises      Shoulder Instructions       General Comments      Pertinent Vitals/ Pain       Pain Assessment Pain Assessment: Faces Faces Pain Scale: Hurts whole lot Pain Location: bilateral hamstrings and posterior calf Pain Descriptors / Indicators: Grimacing, Moaning, Throbbing Pain Intervention(s): Limited activity within patient's tolerance, Monitored during session, Repositioned, Patient requesting pain meds-RN notified  Home Living                                          Prior Functioning/Environment              Frequency  Min 2X/week        Progress Toward Goals  OT Goals(current goals can now be found in the care plan section)  Progress towards OT goals: Progressing toward goals  Acute Rehab OT Goals Patient Stated Goal: less leg pain OT Goal Formulation: With patient Time For Goal Achievement: 11/09/23 Potential to Achieve Goals: Good ADL Goals Pt Will Perform Lower Body Dressing: with min assist;sit to/from stand;with adaptive equipment Pt Will Transfer to Toilet: with min assist;ambulating Additional ADL Goal #1: Pt will demonstrate adherence to 3/3 back precautions with minimal cues. Additional ADL Goal #2: Pt will demonstrate anticpatory awareness for safe engagement in ADL. Additional ADL Goal #3: Pt will complete multistep cog task with <3 errors.  Plan      Co-evaluation                 AM-PAC OT "6 Clicks" Daily Activity     Outcome Measure   Help from another person eating meals?: None Help from another person taking care of personal grooming?: A Little Help from another person toileting, which includes using toliet, bedpan, or urinal?: A Lot Help from another person bathing (including washing, rinsing, drying)?: A Lot Help from another person to put on and taking off regular upper body clothing?: A Little Help from  another person to put on and taking off regular lower body clothing?: A Lot 6 Click Score: 16    End of Session Equipment Utilized During Treatment: Gait belt;Rolling walker (2 wheels)  OT Visit Diagnosis: Other abnormalities of gait and mobility (R26.89);Muscle weakness (generalized) (M62.81);Other symptoms and signs involving cognitive function;Pain Pain - Right/Left: Right Pain - part of body: Leg   Activity Tolerance Patient tolerated treatment well;Patient limited by pain   Patient Left in chair;with call bell/phone within reach;with family/visitor present   Nurse Communication Mobility status;Patient requests pain meds        Time: 1610-9604 OT Time Calculation (min): 29 min  Charges: OT General Charges $OT Visit: 1 Visit OT Treatments $Self Care/Home Management : 23-37 mins  Alfonse Flavors, OTA Acute Rehabilitation Services  Office [phone]   Dewain Penning 11/01/2023, 1:57 PM

## 2023-11-01 NOTE — Plan of Care (Signed)
  Problem: Clinical Measurements: Goal: Ability to maintain clinical measurements within normal limits will improve Outcome: Progressing Goal: Will remain free from infection Outcome: Progressing   Problem: Activity: Goal: Risk for activity intolerance will decrease Outcome: Progressing   Problem: Nutrition: Goal: Adequate nutrition will be maintained Outcome: Progressing   Problem: Elimination: Goal: Will not experience complications related to bowel motility Outcome: Progressing Goal: Will not experience complications related to urinary retention Outcome: Progressing   Problem: Pain Management: Goal: General experience of comfort will improve Outcome: Progressing   Problem: Safety: Goal: Ability to remain free from injury will improve Outcome: Progressing   Problem: Skin Integrity: Goal: Risk for impaired skin integrity will decrease Outcome: Progressing   Problem: Activity: Goal: Will remain free from falls Outcome: Progressing   Problem: Pain Management: Goal: Pain level will decrease Outcome: Progressing

## 2023-11-01 NOTE — TOC Progression Note (Signed)
Transition of Care [facility]) - Progression Note    Patient Details  Name: Emily Mcbride MRN: [id number] Date of Birth: [birth year]  [facility]) CM/SW Contact  Mearl Latin, LCSW Phone Number: 11/01/2023, 3:51 PM  Clinical Narrative:    CSW met with patient, spouse, and daughter to discuss discharge plan and provided SNF bed offers and Medicare ratings list. Spouse is requesting to hear back from [facility] and ask about how [employer] therapy program is. He stated his concern is that patient will get more care at home if all she is going to get is 20 mins of therapy a day. CSW requested facilities follow up.    Expected Discharge Plan: Skilled Nursing Facility Barriers to Discharge: Continued Medical Work up, English as a second language teacher  Expected Discharge Plan and Services   Discharge Planning Services: CM Consult Post Acute Care Choice: Skilled Nursing Facility Living arrangements for the past 2 months: Single Family Home                           HH Arranged: PT, OT HH Agency: [facility] Date [facility] Agency Contacted: 10/25/23   Representative spoke with at [facility] Agency: Kandee Keen   Social Determinants of Health (SDOH) Interventions SDOH Screenings   Food Insecurity: No Food Insecurity (10/27/2023)  Housing: Low Risk  (10/27/2023)  Transportation Needs: No Transportation Needs (10/27/2023)  Utilities: Not At Risk (10/27/2023)  Financial Resource Strain: Low Risk  (04/05/2023)   Received from [facility]  Physical Activity: Inactive (04/05/2023)   Received from [facility]  Social Connections: Socially Integrated (04/05/2023)   Received from [facility]  Stress: Stress Concern Present (04/05/2023)   Received from [facility]  Tobacco Use: Low Risk  (10/24/2023)    Readmission Risk Interventions     No data to display

## 2023-11-01 NOTE — Progress Notes (Signed)
Not much change overnight.  Still with quite a bit of pain in the lower back and posterior aspects of both lower extremities with getting out of bed.  No new numbness or weakness.  Preoperative pain remains improved.  At this point looking towards skilled nursing facility placement for long-term convalescence and recuperation.

## 2023-11-02 NOTE — Progress Notes (Signed)
Neurosurgery Service Progress Note  Subjective: No acute events overnight, preop RLE radic resolved, just some milder bilateral symptoms only when weight bearing, overall improved, back pain improving    Objective: Vitals:   11/01/23 1404 11/01/23 2011 11/02/23 0351 11/02/23 0750  BP: (!) 112/55 (!) 144/69 (!) 169/78 136/68  Pulse: 70  61 60  Resp: 19 18 18    Temp: 98.2 F (36.8 C) 98.7 F (37.1 C) 98.1 F (36.7 C) (!) 97.5 F (36.4 C)  TempSrc:  Oral Oral   SpO2: 96% 95% 95% 95%  Weight:      Height:        Physical Exam: Strength 5/5 x4 except some mild weakness vs effort dependent strength in the R EHL with normal inversion / eversion and TA function, SILTx4, incision c/d/i  Assessment & Plan: 82 y.o. woman s/p 2 level lumbar lami, improved from preop.  -SNF pending -cont pain control / PT in the meantime  Jadene Pierini  11/02/23 9:50 AM

## 2023-11-02 NOTE — Plan of Care (Signed)
  Problem: Education: Goal: Knowledge of General Education information will improve Description Including pain rating scale, medication(s)/side effects and non-pharmacologic comfort measures Outcome: Progressing   

## 2023-11-02 NOTE — Progress Notes (Signed)
Mobility Specialist Progress Note:    11/02/23 1509  Therapy Vitals  Temp 97.9 F (36.6 C)  Pulse Rate 70  Resp 18  BP (!) 125/55  Patient Position (if appropriate) Lying  Oxygen Therapy  SpO2 96 %  Mobility  Activity Refused mobility  Mobility Specialist Start Time (ACUTE ONLY) 1526   Pt refused mobility d/t pain and fatigue. Will f/u as able.    D'Vante Earlene Plater Mobility Specialist Please contact via Special educational needs teacher or Rehab office at [phone]

## 2023-11-03 MED ORDER — PREGABALIN 100 MG PO CAPS
200.0000 mg | ORAL_CAPSULE | Freq: Two times a day (BID) | ORAL | Status: DC
  Administered 2023-11-03 – 2023-11-06 (×7): 200 mg via ORAL
  Filled 2023-11-03 (×8): qty 2

## 2023-11-03 NOTE — Progress Notes (Signed)
Mobility Specialist Progress Note:   11/03/23 1500  Mobility  Activity Ambulated with assistance in hallway  Level of Assistance Contact guard assist, steadying assist  Assistive Device Front wheel walker  Distance Ambulated (ft) 80 ft  Activity Response Tolerated well  Mobility Referral Yes  $Mobility charge 1 Mobility  Mobility Specialist Start Time (ACUTE ONLY) 1452  Mobility Specialist Stop Time (ACUTE ONLY) 1512  Mobility Specialist Time Calculation (min) (ACUTE ONLY) 20 min    Pt received in bed, agreeable to mobility w/ encouragement. C/o BLE pain, but felt better upon ambulating. MinA to stand and only requiring CG for ambulation. Took x1 standing and x1 seated rest break d/t fatigue, but otherwise asymptomatic. Briefly became emotional about transport plans, but was soothed by daughter. Returned to room w/o fault. Pt left in bed with call bell and all needs met. Family present.  D'Vante Earlene Plater Mobility Specialist Please contact via Special educational needs teacher or Rehab office at [phone]

## 2023-11-03 NOTE — Plan of Care (Signed)
  Problem: Education: Goal: Knowledge of General Education information will improve Description Including pain rating scale, medication(s)/side effects and non-pharmacologic comfort measures Outcome: Progressing   

## 2023-11-03 NOTE — Progress Notes (Signed)
Neurosurgery Service Progress Note  Subjective: No acute events overnight. preop RLE radic resolved, has bilateral LE symptoms only when weight bearing, overall improved, back pain improving. She is requesting that all narcotics be removed from her order set as she does not tolerate them well.   Objective: Vitals:   11/02/23 1509 11/02/23 1954 11/03/23 0414 11/03/23 0814  BP: (!) 125/55 137/69 (!) 142/58 (!) 140/80  Pulse: 70 66 60 71  Resp: 18 16 16 18   Temp: 97.9 F (36.6 C) 98.1 F (36.7 C)  97.6 F (36.4 C)  TempSrc:  Oral  Oral  SpO2: 96% 98% 95% 100%  Weight:      Height:        Physical Exam: Strength 5/5 x4 except some mild weakness vs effort dependent strength in the R EHL with normal inversion / eversion and TA function, SILTx4, incision c/d/i   Assessment & Plan: 82 y.o. woman s/p 2 level lumbar lami, improved from preop.   -SNF pending -will increase lyrica to 200mg  BID to see if this can improve her LE symptoms when weight bearing. All narcotics have been removed from her order set   -PT in the meantime  Emilee Hero, PA-C 11/03/23 8:47 AM

## 2023-11-04 MED ORDER — HYDROCODONE-ACETAMINOPHEN 10-325 MG PO TABS: 1.0000 | ORAL_TABLET | Freq: Four times a day (QID) | ORAL | 0 refills | Status: DC | PRN

## 2023-11-04 MED ORDER — METHOCARBAMOL 500 MG PO TABS: 500.0000 mg | ORAL_TABLET | Freq: Four times a day (QID) | ORAL | Status: AC | PRN

## 2023-11-04 MED ORDER — PREGABALIN 200 MG PO CAPS: 200.0000 mg | ORAL_CAPSULE | Freq: Two times a day (BID) | ORAL | 2 refills | Status: DC

## 2023-11-04 NOTE — TOC Progression Note (Addendum)
Transition of Care [facility]) - Progression Note    Patient Details  Name: Emily Mcbride MRN: [id number] Date of Birth: 1941/08/25  Transition [facility] Care [facility]) CM/SW Contact  Lorri Frederick, LCSW Phone Number: 11/04/2023, 8:41 AM  Clinical Narrative:   PASSR received: [phone] A.  1000: CSW spoke with pt and husband regarding SNF vs HH, informed them that [facility] does offer bed, confirmed that this would be private room.  They do want to accept offer at [facility].  CSW message with [facility]: no beds available today, can receive pt tomorrow.  Auth request submitted in [place].    Expected Discharge Plan: Skilled Nursing Facility Barriers to Discharge: Continued Medical Work up, English as a second language teacher  Expected Discharge Plan and Services   Discharge Planning Services: CM Consult Post Acute Care Choice: Skilled Nursing Facility Living arrangements for the past 2 months: Single Family Home                           HH Arranged: PT, OT HH Agency: [facility] Date [facility] Agency Contacted: 10/25/23   Representative spoke with at [facility] Agency: Kandee Keen   Social Determinants of Health (SDOH) Interventions SDOH Screenings   Food Insecurity: No Food Insecurity (10/27/2023)  Housing: Low Risk  (10/27/2023)  Transportation Needs: No Transportation Needs (10/27/2023)  Utilities: Not At Risk (10/27/2023)  Financial Resource Strain: Low Risk  (04/05/2023)   Received from [facility]  Physical Activity: Inactive (04/05/2023)   Received from [facility]  Social Connections: Socially Integrated (04/05/2023)   Received from [facility]  Stress: Stress Concern Present (04/05/2023)   Received from [facility]  Tobacco Use: Low Risk  (10/24/2023)    Readmission Risk Interventions     No data to display

## 2023-11-04 NOTE — Progress Notes (Signed)
   Providing Compassionate, Quality Care - Together   Subjective: Patient reports right leg nerve pain. She feels best when laying flat.  Objective: Vital signs in last 24 hours: Temp:  [97.8 F (36.6 C)-99.2 F (37.3 C)] 98 F (36.7 C) (11/04 0813) Pulse Rate:  [60-70] 60 (11/04 0813) Resp:  [18] 18 (11/03 1542) BP: (120-139)/(60-83) 133/60 (11/04 0813) SpO2:  [94 %-98 %] 94 % (11/04 0813)  Intake/Output from previous day: No intake/output data recorded. Intake/Output this shift: No intake/output data recorded.  Alert and oriented x 4 PERRLA CN II-XII grossly intact MAE, Strength grossly intact Incision is covered with Honeycomb dressing and Steri Strips; Dressing is clean, dry, and intact   Lab Results: No results for input(s): "WBC", "HGB", "HCT", "PLT" in the last 72 hours. BMET No results for input(s): "NA", "K", "CL", "CO2", "GLUCOSE", "BUN", "CREATININE", "CALCIUM" in the last 72 hours.  Studies/Results: No results found.  Assessment/Plan: Patient underwent decompressive laminotomies and foraminotomies on the right at L3-4 and L4-5 on 10/24/2023 with Dr. Jordan Likes. Her post operative course has been complicated due to pain control and slow mobilization.    LOS: 8 days   -Working toward discharge to a SNF. -Continue to encourage mobilization.   Val Eagle, DNP, AGNP-C Nurse Practitioner  [facility] [address], [place], [place] [address] P: [phone]    F: [phone]  11/04/2023, 8:57 AM

## 2023-11-04 NOTE — Progress Notes (Signed)
Mobility Specialist: Progress Note   11/04/23 1606  Mobility  Activity Ambulated with assistance in hallway  Level of Assistance Contact guard assist, steadying assist  Assistive Device Front wheel walker  Distance Ambulated (ft) 50 ft  Activity Response Tolerated well  Mobility Referral Yes  $Mobility charge 1 Mobility  Mobility Specialist Start Time (ACUTE ONLY) 1545  Mobility Specialist Stop Time (ACUTE ONLY) 1600  Mobility Specialist Time Calculation (min) (ACUTE ONLY) 15 min    Pt was agreeable to mobility session - received in bed. SV for bed mobility using logroll technique with minimal verbal cues. MinA for STS with cues for hand placement. CG for ambulation. Required max verbal cues for correcting flexed posture during ambulation; had c/o BLE pain, back pain, and UE pain from RW use. Distance limited when walking with upright posture d/t fatigue and pain, took 1x seated break and 3x standing break. Returned to room without fault. Left in bed with all needs met, call bell in reach. Husband in room.   Maurene Capes Mobility Specialist Please contact via [employer] or Rehab office at [phone]

## 2023-11-04 NOTE — Progress Notes (Signed)
Physical Therapy Treatment Patient Details Name: Emily Mcbride MRN: [id number] DOB: 30-Oct-1941 Today's Date: 11/04/2023   History of Present Illness Pt is a 82 y.o. female who presents to [facility] on 10/25/2023 for  L3-L5 laminectomy and foraminotomy. PMH includes: anemia, arthritis, fibromyalgia, GERD, and HTN    PT Comments  Pt tolerated treatment well today. Pt continues to be limited by anxiety and pain however was able to progress ambulation in hallway with RW CGA. Pt with good recall of spinal precautions however does require max verbal cues for upright posture during gait. No change in DC/DME recs at this time. PT will continue to follow.    If plan is discharge home, recommend the following: A lot of help with walking and/or transfers;A lot of help with bathing/dressing/bathroom;Assistance with cooking/housework;Assist for transportation;Help with stairs or ramp for entrance   Can travel by private vehicle     No  Equipment Recommendations  Rolling walker (2 wheels)    Recommendations for Other Services       Precautions / Restrictions Precautions Precautions: Back;Fall Precaution Booklet Issued: Yes (comment) Precaution Comments: able to recall 3/3 back precautions Restrictions Weight Bearing Restrictions: No     Mobility  Bed Mobility Overal bed mobility: Needs Assistance Bed Mobility: Rolling, Sidelying to Sit, Sit to Sidelying Rolling: Contact guard assist Sidelying to sit: Contact guard assist     Sit to sidelying: Contact guard assist General bed mobility comments: verbal cues for rail use and log rolling technique. Increased time for trunk elevation but able to sit up on her own.    Transfers Overall transfer level: Needs assistance Equipment used: Rolling walker (2 wheels) Transfers: Sit to/from Stand Sit to Stand: Contact guard assist, Min assist           General transfer comment: Cues for hand placement. CGA from bed however Min A from  lower surface.    Ambulation/Gait Ambulation/Gait assistance: Contact guard assist Gait Distance (Feet): 80 Feet Assistive device: Rolling walker (2 wheels) Gait Pattern/deviations: Step-through pattern, Trunk flexed, Wide base of support, Antalgic Gait velocity: slowed     General Gait Details: continues to require cuing for upright posture but is able to achieve better upright when she does attempt. no LOB noted. 1 seated rest break. Several standing rest breaks.   Stairs             Wheelchair Mobility     Tilt Bed    Modified Rankin (Stroke Patients Only)       Balance Overall balance assessment: Needs assistance Sitting-balance support: Bilateral upper extremity supported, Feet supported Sitting balance-Leahy Scale: Fair Sitting balance - Comments: EOB   Standing balance support: Bilateral upper extremity supported Standing balance-Leahy Scale: Poor Standing balance comment: requires BUES support with RW                            Cognition Arousal: Alert Behavior During Therapy: Anxious, Restless, WFL for tasks assessed/performed Overall Cognitive Status: Within Functional Limits for tasks assessed                                 General Comments: able to recall 3/3 back precautions but required verbal cues to adhere. Very anxious and restless about pain.        Exercises      General Comments General comments (skin integrity, edema, etc.): VSS  Pertinent Vitals/Pain Pain Assessment Pain Assessment: Faces Faces Pain Scale: Hurts whole lot Pain Location: bilateral hamstrings and posterior calf Pain Descriptors / Indicators: Grimacing, Moaning, Throbbing Pain Intervention(s): Monitored during session, RN gave pain meds during session, Repositioned, Limited activity within patient's tolerance    Home Living                          Prior Function            PT Goals (current goals can now be found in  the care plan section) Progress towards PT goals: Progressing toward goals    Frequency    Min 1X/week      PT Plan      Co-evaluation              AM-PAC PT "6 Clicks" Mobility   Outcome Measure  Help needed turning from your back to your side while in a flat bed without using bedrails?: A Little Help needed moving from lying on your back to sitting on the side of a flat bed without using bedrails?: A Little Help needed moving to and from a bed to a chair (including a wheelchair)?: A Lot Help needed standing up from a chair using your arms (e.g., wheelchair or bedside chair)?: A Lot Help needed to walk in hospital room?: A Lot Help needed climbing 3-5 steps with a railing? : Total 6 Click Score: 13    End of Session Equipment Utilized During Treatment: Gait belt Activity Tolerance: Patient tolerated treatment well Patient left: in chair;with chair alarm set;with family/visitor present;with call bell/phone within reach Nurse Communication: Mobility status PT Visit Diagnosis: Unsteadiness on feet (R26.81);Pain     Time: 0952-1020 PT Time Calculation (min) (ACUTE ONLY): 28 min  Charges:    $Gait Training: 23-37 mins PT General Charges $$ ACUTE PT VISIT: 1 Visit                     Emily Mcbride, PT, DPT Acute Rehab Services [phone]    Emily Mcbride 11/04/2023, 10:48 AM

## 2023-11-05 MED ORDER — HYDROCODONE-ACETAMINOPHEN 10-325 MG PO TABS: 1.0000 | ORAL_TABLET | Freq: Four times a day (QID) | ORAL | 0 refills | Status: DC | PRN

## 2023-11-05 MED ORDER — PREGABALIN 200 MG PO CAPS: 200.0000 mg | ORAL_CAPSULE | Freq: Two times a day (BID) | ORAL | 2 refills | Status: DC

## 2023-11-05 NOTE — Discharge Summary (Signed)
Physician Discharge Summary     Providing Compassionate, Quality Care - Together   Patient ID: Emily Mcbride MRN: [id number] DOB/AGE: 06-Nov-1941 82 y.o.  Admit date: 10/24/2023 Discharge date: 11/05/2023  Admission Diagnoses: Lumbar radiculopathy  Discharge Diagnoses:  Principal Problem:   Lumbar radiculopathy   Discharged Condition: good  Hospital Course: Patient underwent a decompressive laminotomies and foraminotomies on the right at L3-4 and L4-5 on 10/24/2023 with Dr. Jordan Likes. She was admitted to [facility]  following recovery from anesthesia in the PACU. Her postoperative course has been complicated due to pain control and slow mobilization. She has worked with both physical and occupational therapies who feel the patient is ready for discharge to a skilled nursing facility for further rehabilitation. She is ambulating with assistance. She is tolerating a normal diet. She is not having any bowel or bladder dysfunction. Her pain is well-controlled with oral pain medication. She is ready for discharge home.   Consults: rehabilitation medicine, TOC  Significant Diagnostic Studies: radiology: DG Lumbar Spine 2-3 Views  Result Date: 10/24/2023 CLINICAL DATA:  Elective surgery, intraop localization. EXAM: LUMBAR SPINE - 2-3 VIEW COMPARISON:  Preoperative imaging. FINDINGS: Two portable views of the lumbar spine obtained in the operating room. S1 is partially lumbarized as before. Film 1 labeled portable cross-table demonstrates posterior rod and pedicle screw fixation at L5-S1 with interbody spacer. Spinal stimulator is in place. Film 2 labeled portable sitting demonstrates surgical instrument localizing posteriorly at the upper L4 level. IMPRESSION: Intraoperative localization during lumbar surgery. Electronically Signed   By: Narda Rutherford M.D.   On: 10/24/2023 12:43   DG MYELOGRAPHY LUMBAR INJ LUMBOSACRAL  Result Date: 10/02/2023 CLINICAL DATA:  Chronic, progressively worsening  severe low back pain radiating down the right leg. History of prior L5-S1 fusion and spinal cord stimulator placement. EXAM: LUMBAR MYELOGRAM CT LUMBAR MYELOGRAM FLUOROSCOPY: Radiation Exposure Index (as provided by the fluoroscopic device): 24.0 mGy Kerma PROCEDURE: After thorough discussion of risks and benefits of the procedure including bleeding, infection, injury to nerves, blood vessels, adjacent structures as well as headache and CSF leak, written and oral informed consent was obtained. Consent was obtained by Dr. Malachi Pro. Time out form was completed. Patient was positioned prone on the fluoroscopy table. Local anesthesia was provided with 1% lidocaine without epinephrine after prepped and draped in the usual sterile fashion. Puncture was performed at L1-L2 using a 3 1/2 inch 22-gauge spinal needle via right interlaminar approach. Using a single pass through the dura, the needle was placed within the thecal sac, with return of clear CSF. 15 mL of Isovue M-200 was injected into the thecal sac, with normal opacification of the nerve roots and cauda equina consistent with free flow within the subarachnoid space. I personally performed the lumbar puncture and administered the intrathecal contrast. I also personally supervised acquisition of the myelogram images. TECHNIQUE: Contiguous axial images were obtained through the lumbar spine after the intrathecal infusion of contrast. Coronal and sagittal reconstructions were obtained of the axial image sets. COMPARISON:  CT lumbar spine dated September 20, 2023. CT lumbar myelogram dated June 11, 2019. FINDINGS: LUMBAR MYELOGRAM FINDINGS: Mild anterolisthesis at L4-L5 and L5-S1. No dynamic instability. Small ventral extradural defects from L1-L2 through L3-L4. Moderate ventral extradural defect at L4-L5. Moderate spinal canal stenosis at L2-L3 and L3-L4. Mild spinal canal stenosis at L4-L5. CT LUMBAR MYELOGRAM FINDINGS: Segmentation: Transitional lumbosacral  anatomy again noted with partial lumbarization of S1. Alignment: Unchanged trace anterolisthesis at T12-L1, L4-L5, and L5-S1. Vertebrae: No acute  fracture or other focal pathologic process. Chronic mild L5 superior endplate compression fracture, new since 2020. Prior L5-S1 PLIF with subsidence of the interbody graft and lucency around the S1 screws. Conus medullaris and cauda equina: Conus extends to the L1 level. Conus and cauda equina appear normal. Paraspinal and other soft tissues: Aortoiliac atherosclerotic vascular disease. Multiple calculi in the lower pole of the left kidney. Spinal cord stimulator entering the dorsal epidural space at T12-L1. Disc levels: T12-L1: Unchanged mild disc bulging and moderate bilateral facet arthropathy. No stenosis. L1-L2: Mild-to-moderate disc bulging and left-greater-than-right facet arthropathy. Borderline mild spinal canal and left lateral recess stenosis. No neuroforaminal stenosis. Findings have progressed since 2020. L2-L3: Moderate disc bulging and endplate spurring. Mild bilateral facet arthropathy. Moderate spinal canal and left neuroforaminal stenosis. No right neuroforaminal stenosis. Findings progressed since 2020. L3-L4: Moderate disc bulging and endplate spurring. Superimposed left foraminal disc extrusion has regressed. Moderate bilateral facet arthropathy. Worsened moderate to severe spinal canal and right lateral recess stenosis. Improved mild-to-moderate left neuroforaminal stenosis. No right neuroforaminal stenosis. L4-L5: Prior right hemilaminectomy. Moderate disc bulging with superimposed foraminal disc protrusions. Severe bilateral facet arthropathy. Mild spinal canal stenosis. Moderate left lateral recess stenosis. Severe right and moderate to severe left neuroforaminal stenosis. Findings have progressed since 2020. L5-S1: Prior posterior decompression and PLIF. No residual stenosis. IMPRESSION: 1. Progressive multilevel lumbar spondylosis as described  above. Moderate to severe spinal canal and right lateral recess stenosis at L3-L4. 2. Moderate spinal canal and left neuroforaminal stenosis at L2-L3. 3. Severe right and moderate to severe left neuroforaminal stenosis at L4-L5. 4. Prior L5-S1 PLIF with pseudoarthrosis, interbody graft subsidence, and bilateral S1 screw loosening. 5. Nonobstructive left nephrolithiasis. 6.  Aortic Atherosclerosis (ICD10-I70.0). Electronically Signed   By: Obie Dredge M.D.   On: 10/02/2023 12:48   CT LUMBAR SPINE W CONTRAST  Result Date: 10/02/2023 CLINICAL DATA:  Chronic, progressively worsening severe low back pain radiating down the right leg. History of prior L5-S1 fusion and spinal cord stimulator placement. EXAM: LUMBAR MYELOGRAM CT LUMBAR MYELOGRAM FLUOROSCOPY: Radiation Exposure Index (as provided by the fluoroscopic device): 24.0 mGy Kerma PROCEDURE: After thorough discussion of risks and benefits of the procedure including bleeding, infection, injury to nerves, blood vessels, adjacent structures as well as headache and CSF leak, written and oral informed consent was obtained. Consent was obtained by Dr. Malachi Pro. Time out form was completed. Patient was positioned prone on the fluoroscopy table. Local anesthesia was provided with 1% lidocaine without epinephrine after prepped and draped in the usual sterile fashion. Puncture was performed at L1-L2 using a 3 1/2 inch 22-gauge spinal needle via right interlaminar approach. Using a single pass through the dura, the needle was placed within the thecal sac, with return of clear CSF. 15 mL of Isovue M-200 was injected into the thecal sac, with normal opacification of the nerve roots and cauda equina consistent with free flow within the subarachnoid space. I personally performed the lumbar puncture and administered the intrathecal contrast. I also personally supervised acquisition of the myelogram images. TECHNIQUE: Contiguous axial images were obtained through the  lumbar spine after the intrathecal infusion of contrast. Coronal and sagittal reconstructions were obtained of the axial image sets. COMPARISON:  CT lumbar spine dated September 20, 2023. CT lumbar myelogram dated June 11, 2019. FINDINGS: LUMBAR MYELOGRAM FINDINGS: Mild anterolisthesis at L4-L5 and L5-S1. No dynamic instability. Small ventral extradural defects from L1-L2 through L3-L4. Moderate ventral extradural defect at L4-L5. Moderate spinal canal stenosis at L2-L3  and L3-L4. Mild spinal canal stenosis at L4-L5. CT LUMBAR MYELOGRAM FINDINGS: Segmentation: Transitional lumbosacral anatomy again noted with partial lumbarization of S1. Alignment: Unchanged trace anterolisthesis at T12-L1, L4-L5, and L5-S1. Vertebrae: No acute fracture or other focal pathologic process. Chronic mild L5 superior endplate compression fracture, new since 2020. Prior L5-S1 PLIF with subsidence of the interbody graft and lucency around the S1 screws. Conus medullaris and cauda equina: Conus extends to the L1 level. Conus and cauda equina appear normal. Paraspinal and other soft tissues: Aortoiliac atherosclerotic vascular disease. Multiple calculi in the lower pole of the left kidney. Spinal cord stimulator entering the dorsal epidural space at T12-L1. Disc levels: T12-L1: Unchanged mild disc bulging and moderate bilateral facet arthropathy. No stenosis. L1-L2: Mild-to-moderate disc bulging and left-greater-than-right facet arthropathy. Borderline mild spinal canal and left lateral recess stenosis. No neuroforaminal stenosis. Findings have progressed since 2020. L2-L3: Moderate disc bulging and endplate spurring. Mild bilateral facet arthropathy. Moderate spinal canal and left neuroforaminal stenosis. No right neuroforaminal stenosis. Findings progressed since 2020. L3-L4: Moderate disc bulging and endplate spurring. Superimposed left foraminal disc extrusion has regressed. Moderate bilateral facet arthropathy. Worsened moderate to  severe spinal canal and right lateral recess stenosis. Improved mild-to-moderate left neuroforaminal stenosis. No right neuroforaminal stenosis. L4-L5: Prior right hemilaminectomy. Moderate disc bulging with superimposed foraminal disc protrusions. Severe bilateral facet arthropathy. Mild spinal canal stenosis. Moderate left lateral recess stenosis. Severe right and moderate to severe left neuroforaminal stenosis. Findings have progressed since 2020. L5-S1: Prior posterior decompression and PLIF. No residual stenosis. IMPRESSION: 1. Progressive multilevel lumbar spondylosis as described above. Moderate to severe spinal canal and right lateral recess stenosis at L3-L4. 2. Moderate spinal canal and left neuroforaminal stenosis at L2-L3. 3. Severe right and moderate to severe left neuroforaminal stenosis at L4-L5. 4. Prior L5-S1 PLIF with pseudoarthrosis, interbody graft subsidence, and bilateral S1 screw loosening. 5. Nonobstructive left nephrolithiasis. 6.  Aortic Atherosclerosis (ICD10-I70.0). Electronically Signed   By: Obie Dredge M.D.   On: 10/02/2023 12:48     Treatments: surgery: Right L3-4 decompressive laminotomy and foraminotomies, right L4-5 decompressive laminotomy and foraminotomies   Discharge Exam: Blood pressure (!) 123/53, pulse 69, temperature 98.1 F (36.7 C), temperature source Oral, resp. rate 16, height 5\' 1"  (1.549 m), weight 102.1 kg, SpO2 96%.  Alert and oriented x 4 PERRLA CN II-XII grossly intact MAE, Strength grossly intact Incision is covered with Honeycomb dressing and Steri Strips; Dressing is clean, dry, and intact  Disposition:   Discharge Instructions     Call MD for:  difficulty breathing, headache or visual disturbances   Complete by: As directed    Call MD for:  hives   Complete by: As directed    Call MD for:  persistant nausea and vomiting   Complete by: As directed    Call MD for:  redness, tenderness, or signs of infection (pain, swelling, redness,  odor or green/yellow discharge around incision site)   Complete by: As directed    Call MD for:  severe uncontrolled pain   Complete by: As directed    Diet - low sodium heart healthy   Complete by: As directed    Increase activity slowly   Complete by: As directed       Allergies as of 11/05/2023       Reactions   Red Dye #2 (amaranth) Shortness Of Breath   Iodinated Contrast Media    Other Reaction(s): Other Caused problems with kidneys.   Sulfa Antibiotics Hives, Rash,  Other (See Comments)   Blisters         Medication List     STOP taking these medications    meloxicam 15 MG tablet Commonly known as: MOBIC       TAKE these medications    acetaminophen 650 MG CR tablet Commonly known as: TYLENOL Take 1,300 mg by mouth in the morning and at bedtime.   aspirin EC 81 MG tablet Take 81 mg by mouth in the morning.   BLUE-EMU MAXIMUM STRENGTH EX Apply 1 application topically daily as needed (back pain).   HYDROcodone-acetaminophen 10-325 MG tablet Commonly known as: NORCO Take 1 tablet by mouth every 6 (six) hours as needed for moderate pain (pain score 4-6) ((score 4 to 6)). What changed: when to take this   mesalamine 0.375 g 24 hr capsule Commonly known as: APRISO Take 1.5 g by mouth every morning.   methocarbamol 500 MG tablet Commonly known as: ROBAXIN Take 1 tablet (500 mg total) by mouth every 6 (six) hours as needed for muscle spasms.   metoprolol succinate 50 MG 24 hr tablet Commonly known as: TOPROL-XL Take 50 mg by mouth in the morning.   multivitamin with minerals Tabs tablet Take 1 tablet by mouth in the morning. Centrum Silver for Adults 50+   pregabalin 200 MG capsule Commonly known as: LYRICA Take 1 capsule (200 mg total) by mouth 2 (two) times daily. What changed:  medication strength how much to take   rosuvastatin 5 MG tablet Commonly known as: CRESTOR Take 5 mg by mouth at bedtime.   sertraline 50 MG tablet Commonly known  as: ZOLOFT Take 50 mg by mouth in the morning.   SYSTANE OP Place 1 drop into both eyes 3 (three) times daily as needed (dry/irritated eyes.).        Contact information for follow-up providers     [facility] Follow up.   Specialty: Home Health Services Why: the home health agency will contact you for the first home visit Contact information: [address] [place] [address] [phone]         Julio Sicks, MD. Go on 11/20/2023.   Specialty: Neurosurgery Why: First post op appointment is on 11/20/2023 at 2:45 PM. Contact information: [address] [place] [address] [phone]              Contact information for after-discharge care     Destination     [facility] Preferred SNF .   Service: Skilled Nursing Contact information: [address] [place] [address] [phone]                     Signed: Val Eagle, DNP, AGNP-C Nurse Practitioner  [facility] [address], [place], [place] [address] P: [phone]    F: [phone]  11/06/2023, 10:14 AM

## 2023-11-05 NOTE — Plan of Care (Signed)
  Problem: Education: Goal: Knowledge of General Education information will improve Description: Including pain rating scale, medication(s)/side effects and non-pharmacologic comfort measures Outcome: Progressing   Problem: Health Behavior/Discharge Planning: Goal: Ability to manage health-related needs will improve Outcome: Progressing   Problem: Clinical Measurements: Goal: Ability to maintain clinical measurements within normal limits will improve Outcome: Progressing Goal: Will remain free from infection Outcome: Progressing Goal: Diagnostic test results will improve Outcome: Progressing Goal: Respiratory complications will improve Outcome: Progressing Goal: Cardiovascular complication will be avoided Outcome: Progressing   Problem: Activity: Goal: Risk for activity intolerance will decrease Outcome: Progressing   Problem: Nutrition: Goal: Adequate nutrition will be maintained Outcome: Progressing   Problem: Coping: Goal: Level of anxiety will decrease Outcome: Progressing   Problem: Elimination: Goal: Will not experience complications related to bowel motility Outcome: Progressing Goal: Will not experience complications related to urinary retention Outcome: Progressing   Problem: Pain Management: Goal: General experience of comfort will improve Outcome: Progressing   Problem: Safety: Goal: Ability to remain free from injury will improve Outcome: Progressing   Problem: Skin Integrity: Goal: Risk for impaired skin integrity will decrease Outcome: Progressing   Problem: Education: Goal: Ability to verbalize activity precautions or restrictions will improve Outcome: Progressing Goal: Knowledge of the prescribed therapeutic regimen will improve Outcome: Progressing Goal: Understanding of discharge needs will improve Outcome: Progressing   Problem: Activity: Goal: Ability to avoid complications of mobility impairment will improve Outcome: Progressing Goal:  Ability to tolerate increased activity will improve Outcome: Progressing Goal: Will remain free from falls Outcome: Progressing   Problem: Bowel/Gastric: Goal: Gastrointestinal status for postoperative course will improve Outcome: Progressing   Problem: Clinical Measurements: Goal: Ability to maintain clinical measurements within normal limits will improve Outcome: Progressing Goal: Postoperative complications will be avoided or minimized Outcome: Progressing Goal: Diagnostic test results will improve Outcome: Progressing   Problem: Pain Management: Goal: Pain level will decrease Outcome: Progressing   Problem: Skin Integrity: Goal: Will show signs of wound healing Outcome: Progressing

## 2023-11-05 NOTE — Discharge Instructions (Signed)
Wound Care Keep incision covered and dry until post op day 3. You may remove the Honeycomb dressing on post op day 3. Leave steri-strips on back.  They will fall off by themselves. Do not put any creams, lotions, or ointments on incision. You are fine to shower. Let water run over incision and pat dry.  Activity Walk each and every day, increasing distance each day. No lifting greater than 5 lbs.  Avoid excessive back motion. No driving for 2 weeks; may ride as a passenger locally.  Diet Resume your normal diet.   Return to Work Will be discussed at your follow up appointment.  Call Your Doctor If Any of These Occur Redness, drainage, or swelling at the wound.  Temperature greater than 101 degrees. Severe pain not relieved by pain medication. Incision starts to come apart.  Follow Up Appt Call [phone] today for appointment in 2-3 weeks if you don't already have one or for any problems.

## 2023-11-05 NOTE — Progress Notes (Signed)
Occupational Therapy Treatment Patient Details Name: Emily Mcbride MRN: [id number] DOB: 1941-11-03 Today's Date: 11/05/2023   History of present illness Pt is a 82 y.o. female who presents to [facility] on 10/25/2023 for  L3-L5 laminectomy and foraminotomy. PMH includes: anemia, arthritis, fibromyalgia, GERD, and HTN   OT comments  Pt c/o pain at rest 6/10 to back. Pt husband present during session. Pt able to complete bed mobility with increased time, CGA, using log roll technique from flat surface and bed rails. Pt attempted to complete without bed rails but needs them currently. Pt min A to power STS, able to ambulate in hall and to restroom with CGA using RW. Pt required multiple rest breaks during ambulation. Pt DC plan still appropriate, will continue to see acutely to progress as able.       If plan is discharge home, recommend the following:  A lot of help with bathing/dressing/bathroom;A little help with walking and/or transfers;Assistance with cooking/housework;Assist for transportation;Help with stairs or ramp for entrance   Equipment Recommendations  Other (comment)    Recommendations for Other Services      Precautions / Restrictions Precautions Precautions: Back;Fall Precaution Booklet Issued: Yes (comment) Precaution Comments: able to recall 3/3 back precautions Restrictions Weight Bearing Restrictions: No       Mobility Bed Mobility Overal bed mobility: Needs Assistance Bed Mobility: Rolling, Sidelying to Sit, Sit to Sidelying Rolling: Contact guard assist Sidelying to sit: Contact guard assist     Sit to sidelying: Contact guard assist General bed mobility comments: Pt able to complete log roll with CGA in/out of bed    Transfers Overall transfer level: Needs assistance Equipment used: Rolling walker (2 wheels) Transfers: Sit to/from Stand, Bed to chair/wheelchair/BSC Sit to Stand: Min assist           General transfer comment: min A from  lower surfaces for STS, CGA for mobility     Balance Overall balance assessment: Needs assistance Sitting-balance support: No upper extremity supported, Feet supported Sitting balance-Leahy Scale: Fair Sitting balance - Comments: EOB   Standing balance support: Bilateral upper extremity supported Standing balance-Leahy Scale: Poor Standing balance comment: requires BUES support with RW                           ADL either performed or assessed with clinical judgement   ADL Overall ADL's : Needs assistance/impaired Eating/Feeding: Independent;Sitting               Upper Body Dressing : Set up;Sitting       Toilet Transfer: Minimal assistance;Ambulation;Rolling walker (2 wheels)   Toileting- Clothing Manipulation and Hygiene: Minimal assistance;Sit to/from stand       Functional mobility during ADLs: Contact guard assist;Rolling walker (2 wheels) General ADL Comments: Pt min A for STS, CGA for mobility. min A for toileting hygiene    Extremity/Trunk Assessment              Vision       Perception     Praxis      Cognition Arousal: Alert Behavior During Therapy: WFL for tasks assessed/performed Overall Cognitive Status: Within Functional Limits for tasks assessed                                 General Comments: pleasant, conversational, able to fully participate        Exercises  Shoulder Instructions       General Comments      Pertinent Vitals/ Pain       Pain Assessment Pain Assessment: 0-10 Pain Score: 6  Faces Pain Scale: Hurts even more Pain Location: back Pain Descriptors / Indicators: Grimacing, Moaning, Throbbing Pain Intervention(s): Monitored during session  Home Living                                          Prior Functioning/Environment              Frequency  Min 2X/week        Progress Toward Goals  OT Goals(current goals can now be found in the care plan  section)  Progress towards OT goals: Progressing toward goals  Acute Rehab OT Goals Patient Stated Goal: to contorl pain levels OT Goal Formulation: With patient/family Time For Goal Achievement: 11/09/23 Potential to Achieve Goals: Good ADL Goals Pt Will Perform Lower Body Dressing: with min assist;sit to/from stand;with adaptive equipment Pt Will Transfer to Toilet: with min assist;ambulating Additional ADL Goal #1: Pt will demonstrate adherence to 3/3 back precautions with minimal cues. Additional ADL Goal #2: Pt will demonstrate anticpatory awareness for safe engagement in ADL. Additional ADL Goal #3: Pt will complete multistep cog task with <3 errors.  Plan      Co-evaluation                 AM-PAC OT "6 Clicks" Daily Activity     Outcome Measure   Help from another person eating meals?: None Help from another person taking care of personal grooming?: A Little Help from another person toileting, which includes using toliet, bedpan, or urinal?: A Little Help from another person bathing (including washing, rinsing, drying)?: A Lot Help from another person to put on and taking off regular upper body clothing?: A Little Help from another person to put on and taking off regular lower body clothing?: A Little 6 Click Score: 18    End of Session Equipment Utilized During Treatment: Gait belt;Rolling walker (2 wheels)  OT Visit Diagnosis: Other abnormalities of gait and mobility (R26.89);Muscle weakness (generalized) (M62.81);Other symptoms and signs involving cognitive function;Pain Pain - Right/Left: Right Pain - part of body: Leg   Activity Tolerance Patient tolerated treatment well;Patient limited by pain   Patient Left in bed;with call bell/phone within reach;with family/visitor present   Nurse Communication Mobility status        Time: 1540-1606 OT Time Calculation (min): 26 min  Charges: OT General Charges $OT Visit: 1 Visit OT Treatments $Self Care/Home  Management : 8-22 mins $Therapeutic Activity: 8-22 mins  Shamecka Hocutt, OTR/L   Acacia Latorre R Prescott Truex 11/05/2023, 4:20 PM

## 2023-11-05 NOTE — Plan of Care (Signed)
  Problem: Education: Goal: Knowledge of General Education information will improve Description Including pain rating scale, medication(s)/side effects and non-pharmacologic comfort measures Outcome: Progressing   Problem: Health Behavior/Discharge Planning: Goal: Ability to manage health-related needs will improve Outcome: Progressing   

## 2023-11-05 NOTE — Progress Notes (Signed)
   Providing Compassionate, Quality Care - Together   Subjective: Patient reports some issues with pain this morning.  Objective: Vital signs in last 24 hours: Temp:  [97.5 F (36.4 C)-98.9 F (37.2 C)] 97.5 F (36.4 C) (11/05 0745) Pulse Rate:  [61-71] 63 (11/05 0745) Resp:  [18-19] 19 (11/05 0745) BP: (132-151)/(59-86) 142/67 (11/05 0745) SpO2:  [97 %-100 %] 99 % (11/05 0745)  Intake/Output from previous day: 11/04 0701 - 11/05 0700 In: 1080 [P.O.:1080] Out: -  Intake/Output this shift: Total I/O In: 240 [P.O.:240] Out: -   Alert and oriented x 4 PERRLA CN II-XII grossly intact MAE, Strength grossly intact Incision is covered with Honeycomb dressing and Steri Strips; Dressing is clean, dry, and intact  Lab Results: No results for input(s): "WBC", "HGB", "HCT", "PLT" in the last 72 hours. BMET No results for input(s): "NA", "K", "CL", "CO2", "GLUCOSE", "BUN", "CREATININE", "CALCIUM" in the last 72 hours.  Studies/Results: No results found.  Assessment/Plan: Patient underwent decompressive laminotomies and foraminotomies on the right at L3-4 and L4-5 on 10/24/2023 with Dr. Jordan Likes. Her post operative course has been complicated due to pain control and slow mobilization.     LOS: 9 days   -Plan is to discharge to SNF tomorrow -Continue to encourage mobilization   Val Eagle, DNP, AGNP-C Nurse Practitioner  [facility] [address], [place], [place] [address] P: [phone]    F: [phone]  11/05/2023, 12:12 PM

## 2023-11-05 NOTE — TOC Progression Note (Addendum)
Transition of Care [facility]) - Progression Note    Patient Details  Name: Emily Mcbride MRN: [id number] Date of Birth: 12-22-1941  Transition of Care [facility]) CM/SW Contact  Lorri Frederick, LCSW Phone Number: 11/05/2023, 11:33 AM  Clinical Narrative:   Additional information sent through [employer] portal.  1130: Peer to peer offered: due by 1530 today, call [phone], option 5.  CSW called MD office and provided information for P2P.  1330: Auth approved in [place]: [id number], [id number], 3 days: 11/5-11/7.  CSW message from [facility].  They can receive pt tomorrow.      Expected Discharge Plan: Skilled Nursing Facility Barriers to Discharge: Continued Medical Work up, English as a second language teacher  Expected Discharge Plan and Services   Discharge Planning Services: CM Consult Post Acute Care Choice: Skilled Nursing Facility Living arrangements for the past 2 months: Single Family Home                           HH Arranged: PT, OT HH Agency: [facility] Date [facility] Agency Contacted: 10/25/23   Representative spoke with at [facility] Agency: Kandee Keen   Social Determinants of Health (SDOH) Interventions SDOH Screenings   Food Insecurity: No Food Insecurity (10/27/2023)  Housing: Low Risk  (10/27/2023)  Transportation Needs: No Transportation Needs (10/27/2023)  Utilities: Not At Risk (10/27/2023)  Financial Resource Strain: Low Risk  (04/05/2023)   Received from [facility]  Physical Activity: Inactive (04/05/2023)   Received from [facility]  Social Connections: Socially Integrated (04/05/2023)   Received from [facility]  Stress: Stress Concern Present (04/05/2023)   Received from [facility]  Tobacco Use: Low Risk  (10/24/2023)    Readmission Risk Interventions     No data to display

## 2023-11-06 MED ORDER — PREGABALIN 200 MG PO CAPS: 200.0000 mg | ORAL_CAPSULE | Freq: Two times a day (BID) | ORAL | 2 refills | Status: AC

## 2023-11-06 MED ORDER — HYDROCODONE-ACETAMINOPHEN 10-325 MG PO TABS: 1.0000 | ORAL_TABLET | Freq: Four times a day (QID) | ORAL | 0 refills | Status: AC | PRN

## 2023-11-06 NOTE — Progress Notes (Signed)
Mobility Specialist: Progress Note   11/06/23 1331  Mobility  Activity Ambulated with assistance to bathroom  Level of Assistance Contact guard assist, steadying assist  Assistive Device Front wheel walker  Distance Ambulated (ft) 20 ft  Activity Response Tolerated well  Mobility Referral Yes  $Mobility charge 1 Mobility  Mobility Specialist Start Time (ACUTE ONLY) 1135  Mobility Specialist Stop Time (ACUTE ONLY) 1142  Mobility Specialist Time Calculation (min) (ACUTE ONLY) 7 min    Pt was agreeable to mobility session - received in bed. SV for bed mobility with verbal cues for logroll technique. MinA for STS. CG mostly for ambulation, became MinA at times d/t BLE and back pain. Ambulated to the BR for shower with NT. Left in BR on shower chair with NT, all needs met.   MS returned around 1150 after shower, assisted pt with getting dressed and ambulating back to chair. Had c/o pain from back all the way down legs. Able to put shirt on independently, but required assistance with pants and undergarments/socks. Left pt in recliner chair with all needs met, call bell in reach. Family in room.    Maurene Capes Mobility Specialist Please contact via SecureChat or Rehab office at [phone]

## 2023-11-06 NOTE — Plan of Care (Signed)
  Problem: Education: Goal: Knowledge of General Education information will improve Description: Including pain rating scale, medication(s)/side effects and non-pharmacologic comfort measures Outcome: Progressing   Problem: Health Behavior/Discharge Planning: Goal: Ability to manage health-related needs will improve Outcome: Progressing   Problem: Clinical Measurements: Goal: Ability to maintain clinical measurements within normal limits will improve Outcome: Progressing Goal: Will remain free from infection Outcome: Progressing Goal: Diagnostic test results will improve Outcome: Progressing Goal: Respiratory complications will improve Outcome: Progressing Goal: Cardiovascular complication will be avoided Outcome: Progressing   Problem: Activity: Goal: Risk for activity intolerance will decrease Outcome: Progressing   Problem: Nutrition: Goal: Adequate nutrition will be maintained Outcome: Progressing   Problem: Coping: Goal: Level of anxiety will decrease Outcome: Progressing   Problem: Elimination: Goal: Will not experience complications related to bowel motility Outcome: Progressing Goal: Will not experience complications related to urinary retention Outcome: Progressing   Problem: Pain Management: Goal: General experience of comfort will improve Outcome: Progressing   Problem: Safety: Goal: Ability to remain free from injury will improve Outcome: Progressing   Problem: Skin Integrity: Goal: Risk for impaired skin integrity will decrease Outcome: Progressing   Problem: Education: Goal: Ability to verbalize activity precautions or restrictions will improve Outcome: Progressing Goal: Knowledge of the prescribed therapeutic regimen will improve Outcome: Progressing Goal: Understanding of discharge needs will improve Outcome: Progressing   Problem: Activity: Goal: Ability to avoid complications of mobility impairment will improve Outcome: Progressing Goal:  Ability to tolerate increased activity will improve Outcome: Progressing Goal: Will remain free from falls Outcome: Progressing   Problem: Bowel/Gastric: Goal: Gastrointestinal status for postoperative course will improve Outcome: Progressing   Problem: Clinical Measurements: Goal: Ability to maintain clinical measurements within normal limits will improve Outcome: Progressing Goal: Postoperative complications will be avoided or minimized Outcome: Progressing Goal: Diagnostic test results will improve Outcome: Progressing   Problem: Pain Management: Goal: Pain level will decrease Outcome: Progressing   Problem: Skin Integrity: Goal: Will show signs of wound healing Outcome: Progressing

## 2023-11-06 NOTE — TOC Progression Note (Addendum)
Transition of Care [facility]) - Progression Note    Patient Details  Name: Emily Mcbride MRN: [id number] Date of Birth: 1941-10-29  Transition of Care [facility]) CM/SW Contact  Lorri Frederick, LCSW Phone Number: 11/06/2023, 11:54 AM  Clinical Narrative:   CSW called MD office for DC summary by 12 noon.  1100: no norco script found, CSW called MD office for script.   1140: second call to MD office.  They have not gotten response from provider yet.   Expected Discharge Plan: Skilled Nursing Facility Barriers to Discharge: Barriers Resolved  Expected Discharge Plan and Services   Discharge Planning Services: CM Consult Post Acute Care Choice: Skilled Nursing Facility Living arrangements for the past 2 months: Single Family Home Expected Discharge Date: 11/06/23                         HH Arranged: PT, OT HH Agency: [facility] Date [facility] Agency Contacted: 10/25/23   Representative spoke with at [facility] Agency: Kandee Keen   Social Determinants of Health (SDOH) Interventions SDOH Screenings   Food Insecurity: No Food Insecurity (10/27/2023)  Housing: Low Risk  (10/27/2023)  Transportation Needs: No Transportation Needs (10/27/2023)  Utilities: Not At Risk (10/27/2023)  Financial Resource Strain: Low Risk  (04/05/2023)   Received from [facility]  Physical Activity: Inactive (04/05/2023)   Received from [facility]  Social Connections: Socially Integrated (04/05/2023)   Received from [facility]  Stress: Stress Concern Present (04/05/2023)   Received from [facility]  Tobacco Use: Low Risk  (10/24/2023)    Readmission Risk Interventions     No data to display

## 2023-11-06 NOTE — Progress Notes (Signed)
Report called to [facility] [place].

## 2023-11-06 NOTE — TOC Transition Note (Signed)
Transition of Care [facility]) - CM/SW Discharge Note   Patient Details  Name: Emily Mcbride MRN: [id number] Date of Birth: [birth year]  Transition of Care [facility]) CM/SW Contact:  Lorri Frederick, LCSW Phone Number: 11/06/2023, 11:06 AM   Clinical Narrative:   Pt discharging to [facility].  RN call report to [phone].      Final next level of care: Skilled Nursing Facility Barriers to Discharge: Barriers Resolved   Patient Goals and CMS Choice CMS Medicare.gov Compare Post Acute Care list provided to:: Patient Choice offered to / list presented to : Patient  Discharge Placement                Patient chooses bed at:  [facility]) Patient to be transferred to facility by: [facility] Name of family member notified: husband Emily Mcbride in room Patient and family notified of of transfer: 11/06/23  Discharge Plan and Services Additional resources added to the After Visit Summary for     Discharge Planning Services: CM Consult Post Acute Care Choice: Skilled Nursing Facility                    HH Arranged: PT, OT [facility] Agency: [facility] Date [facility] Agency Contacted: 10/25/23   Representative spoke with at [facility] Agency: Kandee Keen  Social Determinants of Health (SDOH) Interventions SDOH Screenings   Food Insecurity: No Food Insecurity (10/27/2023)  Housing: Low Risk  (10/27/2023)  Transportation Needs: No Transportation Needs (10/27/2023)  Utilities: Not At Risk (10/27/2023)  Financial Resource Strain: Low Risk  (04/05/2023)   Received from [facility]  Physical Activity: Inactive (04/05/2023)   Received from [facility]  Social Connections: Socially Integrated (04/05/2023)   Received from [facility]  Stress: Stress Concern Present (04/05/2023)   Received from [facility]  Tobacco Use: Low Risk  (10/24/2023)     Readmission Risk Interventions     No data to display
